# Patient Record
Sex: Male | Born: 1945 | Race: Black or African American | Hispanic: No | Marital: Married | Smoking: Never smoker
Health system: Southern US, Community
[De-identification: ages and names within clinical notes are randomized; demographics above are authoritative.]

## PROBLEM LIST (undated history)

## (undated) DIAGNOSIS — I10 Essential (primary) hypertension: Secondary | ICD-10-CM

## (undated) DIAGNOSIS — J309 Allergic rhinitis, unspecified: Secondary | ICD-10-CM

## (undated) HISTORY — DX: Allergic rhinitis, unspecified: J30.9

## (undated) HISTORY — DX: Essential (primary) hypertension: I10

---

## 2010-11-08 ENCOUNTER — Emergency Department (HOSPITAL_COMMUNITY)
Admission: EM | Admit: 2010-11-08 | Discharge: 2010-11-09 | Disposition: A | Discharge status: Home / Self Care | Payer: Self-pay | Attending: Emergency Medicine | Admitting: Emergency Medicine

## 2010-11-08 DIAGNOSIS — Z0000 Encounter for general adult medical examination without abnormal findings: Secondary | ICD-10-CM | POA: Insufficient documentation

## 2012-11-05 ENCOUNTER — Emergency Department (HOSPITAL_COMMUNITY)
Admission: EM | Admit: 2012-11-05 | Discharge: 2012-11-05 | Disposition: A | Discharge status: Home / Self Care | Payer: Medicare HMO | Attending: Emergency Medicine | Admitting: Emergency Medicine

## 2012-11-05 ENCOUNTER — Encounter (HOSPITAL_COMMUNITY): Payer: Self-pay | Admitting: Emergency Medicine

## 2012-11-05 DIAGNOSIS — M25569 Pain in unspecified knee: Secondary | ICD-10-CM | POA: Insufficient documentation

## 2012-11-05 DIAGNOSIS — M79609 Pain in unspecified limb: Secondary | ICD-10-CM | POA: Insufficient documentation

## 2012-11-05 MED ORDER — ENOXAPARIN SODIUM 80 MG/0.8ML ~~LOC~~ SOLN
1.0000 mg/kg | Freq: Once | SUBCUTANEOUS | Status: AC
  Administered 2012-11-05: 75 mg via SUBCUTANEOUS
  Filled 2012-11-05: qty 0.8

## 2012-11-05 MED ORDER — HYDROCODONE-ACETAMINOPHEN 5-325 MG PO TABS
1.0000 | ORAL_TABLET | Freq: Once | ORAL | Status: AC
  Administered 2012-11-05: 1 via ORAL
  Filled 2012-11-05: qty 1

## 2012-11-05 NOTE — ED Provider Notes (Signed)
Medical screening examination/treatment/procedure(s) were performed by non-physician practitioner and as supervising physician I was immediately available for consultation/collaboration.  Kamdyn Colborn, MD 11/05/12 0623 

## 2012-11-05 NOTE — ED Notes (Signed)
Pt c/o pain to L popliteal area onset @ 1600 today, denies injury, pt states he had been traveling by bus for @ 5 hours prior to feeling pain.

## 2012-11-05 NOTE — ED Provider Notes (Signed)
History     CSN: [id number]  Arrival date & time 11/05/12  0003   First MD Initiated Contact with Patient 11/05/12 0158      Chief Complaint  Patient presents with  . Leg Pain   HPI  History provided by the patient and family. Patient is a 67 year old male with no significant PMH who presents with complaints of acute onset left leg and knee pains. Patient was traveling by bus home to [place] earlier today. He had a 5 hour bus trip and around 4 PM developed sudden sharp pain to the popliteal area of his left knee. Pain was persistent and worse with any movements and ambulation. He also reports some earlier episode of swelling down the medial aspect of the lower leg. This has since resolved some. He did not use any treatment for his symptoms. He denies any old or previous injuries to this knee. There was no trauma or fall to his knee. No other aggravating or alleviating factors. No associated chest pain, shortness of breath hemoptysis or heart palpitations. No other associated symptoms.    History reviewed. No pertinent past medical history.  History reviewed. No pertinent past surgical history.  No family history on file.  History  Substance Use Topics  . Smoking status: Never Smoker   . Smokeless tobacco: Not on file  . Alcohol Use: No      Review of Systems  Respiratory: Negative for shortness of breath.   Cardiovascular: Negative for chest pain and palpitations.  All other systems reviewed and are negative.    Allergies  Review of patient's allergies indicates no known allergies.  Home Medications  No current outpatient prescriptions on file.  BP 149/101  Pulse 68  Temp(Src) 98.4 F (36.9 C) (Oral)  Resp 18  Ht 5\' 7"  (1.702 m)  Wt 170 lb (77.111 kg)  BMI 26.62 kg/m2  SpO2 98%  Physical Exam  Nursing note and vitals reviewed. Constitutional: He is oriented to person, place, and time. He appears well-developed and well-nourished. No distress.  HENT:   Head: Normocephalic.  Cardiovascular: Normal rate and regular rhythm.   No murmur heard. Pulmonary/Chest: Effort normal and breath sounds normal. No respiratory distress. He has no wheezes. He has no rales.  Abdominal: Soft. There is no tenderness.  Musculoskeletal: Normal range of motion. He exhibits tenderness. He exhibits no edema.  There is tenderness in the left popliteal area without any mass appreciable swelling. Tendon attachments of the hamstrings palpable without tenderness.  Neurological: He is alert and oriented to person, place, and time.  Skin: Skin is warm. No rash noted. No erythema.  Psychiatric: He has a normal mood and affect. His behavior is normal.    ED Course  Procedures      1. Lower leg pain, left       MDM  2:30AM patient seen and evaluated. Patient well-appearing in no acute distress.   Patient scheduled for vascular Doppler of left lower leg. Dose of Lovenox given prophylaxis against possible DVT. Clinical exam is low suspicion for DVT at this time.     Angus Seller, PA-C 11/05/12 [phone]

## 2013-02-21 ENCOUNTER — Telehealth: Payer: Self-pay | Admitting: Family Medicine

## 2013-02-21 NOTE — Telephone Encounter (Signed)
PT states that Dr. Clent Ridges requested that he come in on Monday. This patient has never seen Dr. Clent Ridges before. Please assist.

## 2013-02-22 NOTE — Telephone Encounter (Signed)
Scheduled

## 2013-02-22 NOTE — Telephone Encounter (Signed)
He is the husband of one of my patients and I agreed to see him. Please set him up for next week

## 2013-02-27 ENCOUNTER — Ambulatory Visit (INDEPENDENT_AMBULATORY_CARE_PROVIDER_SITE_OTHER): Payer: Medicare Other | Admitting: Family Medicine

## 2013-02-27 ENCOUNTER — Encounter: Payer: Self-pay | Admitting: Family Medicine

## 2013-02-27 VITALS — BP 158/96 | HR 87 | Temp 98.5°F | Ht 66.75 in | Wt 168.0 lb

## 2013-02-27 DIAGNOSIS — I10 Essential (primary) hypertension: Secondary | ICD-10-CM

## 2013-02-27 DIAGNOSIS — N138 Other obstructive and reflux uropathy: Secondary | ICD-10-CM

## 2013-02-27 DIAGNOSIS — N139 Obstructive and reflux uropathy, unspecified: Secondary | ICD-10-CM

## 2013-02-27 DIAGNOSIS — N401 Enlarged prostate with lower urinary tract symptoms: Secondary | ICD-10-CM

## 2013-02-27 LAB — CBC WITH DIFFERENTIAL/PLATELET
Basophils Absolute: 0 10*3/uL (ref 0.0–0.1)
Eosinophils Absolute: 0.1 10*3/uL (ref 0.0–0.7)
HCT: 42.3 % (ref 39.0–52.0)
Hemoglobin: 14 g/dL (ref 13.0–17.0)
Lymphs Abs: 2 10*3/uL (ref 0.7–4.0)
MCHC: 33.2 g/dL (ref 30.0–36.0)
MCV: 94.8 fl (ref 78.0–100.0)
Monocytes Relative: 7.5 % (ref 3.0–12.0)
Neutro Abs: 1.7 10*3/uL (ref 1.4–7.7)
RDW: 13.6 % (ref 11.5–14.6)

## 2013-02-27 LAB — HEPATIC FUNCTION PANEL
AST: 21 U/L (ref 0–37)
Alkaline Phosphatase: 97 U/L (ref 39–117)
Bilirubin, Direct: 0.1 mg/dL (ref 0.0–0.3)
Total Bilirubin: 0.8 mg/dL (ref 0.3–1.2)

## 2013-02-27 LAB — LIPID PANEL
Total CHOL/HDL Ratio: 6
VLDL: 21.4 mg/dL (ref 0.0–40.0)

## 2013-02-27 LAB — BASIC METABOLIC PANEL
Chloride: 105 mEq/L (ref 96–112)
Potassium: 4.7 mEq/L (ref 3.5–5.1)

## 2013-02-27 LAB — PSA: PSA: 0.78 ng/mL (ref 0.10–4.00)

## 2013-02-27 NOTE — Progress Notes (Signed)
  Subjective:    Patient ID: Darrell Baldwin, male    DOB: [birth year], 67 y.o.   MRN: [id number]  HPI 67 yr old male to establish with [place]. I have seen his wife as a patient for years now. He feels well and has no concerns. The last time he saw a doctor in the office was about 5 years ago. He was told years ago that he had high BP and the doctor at that time wanted to treat it, but Darrell Baldwin was very resistant to taking any medications. He does check his BP monthly at the pharmacy, and he usually gets readings of 140s over 90s. He exercises daily by doing push ups and sit ups and he walks. He did martial arts for years when he was younger and he tries to keep himself in shape. 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 He has a strong family hx of HTN, high cholesterol, and diabetes.    Review of Systems  Constitutional: Negative.   Respiratory: Negative.   Cardiovascular: Negative.   Endocrine: Negative.   Neurological: Negative.        Objective:   Physical Exam  Constitutional: He is oriented to person, place, and time. He appears well-developed and well-nourished. No distress.  Neck: No thyromegaly present.  Cardiovascular: Normal rate, regular rhythm, normal heart sounds and intact distal pulses.   Pulmonary/Chest: Effort normal and breath sounds normal.  Musculoskeletal: He exhibits no edema.  Lymphadenopathy:    He has no cervical adenopathy.  Neurological: He is alert and oriented to person, place, and time.          Assessment & Plan:  Since he is fasting we will get labs today. He definitely has HTN and I advised him to start on medications for this, but he is very resistant to this. I asked him to watch the sodium intake in his diet and we will recheck this in one month. We will plan on doing a cpx at that time, and we will probably start a BP med then also.

## 2013-03-01 NOTE — Progress Notes (Signed)
Quick Note:  I spoke with pt and put a copy of results in mail, per pt request. ______

## 2013-04-02 ENCOUNTER — Encounter: Payer: Self-pay | Admitting: Family Medicine

## 2013-04-02 ENCOUNTER — Ambulatory Visit (INDEPENDENT_AMBULATORY_CARE_PROVIDER_SITE_OTHER): Payer: Medicare HMO | Admitting: Family Medicine

## 2013-04-02 VITALS — BP 150/100 | HR 105 | Temp 97.8°F | Wt 170.0 lb

## 2013-04-02 DIAGNOSIS — Z23 Encounter for immunization: Secondary | ICD-10-CM

## 2013-04-02 DIAGNOSIS — I10 Essential (primary) hypertension: Secondary | ICD-10-CM

## 2013-04-02 MED ORDER — AMLODIPINE BESYLATE 5 MG PO TABS: 5.0000 mg | ORAL_TABLET | Freq: Every day | ORAL | Status: DC

## 2013-04-02 NOTE — Progress Notes (Signed)
  Subjective:    Patient ID: Darrell Baldwin, male    DOB: [birth year], 67 y.o.   MRN: [id number]  HPI Here to follow up on newly diagnosed HTN. We saw him one month ago and we wanted to start him on meds for this at that time. However he was very resistant to this idea andhe wanted to try diet and exercise first. Now he has done so, but the BP has not improved. He still feels well.    Review of Systems  Constitutional: Negative.   Respiratory: Negative.   Cardiovascular: Negative.   Neurological: Negative.        Objective:   Physical Exam  Constitutional: He appears well-developed and well-nourished.  Cardiovascular: Normal rate, regular rhythm, normal heart sounds and intact distal pulses.   Pulmonary/Chest: Effort normal and breath sounds normal.          Assessment & Plan:  Start on Amlodipine and recheck in one month.

## 2013-04-25 ENCOUNTER — Ambulatory Visit (INDEPENDENT_AMBULATORY_CARE_PROVIDER_SITE_OTHER): Payer: Medicare HMO | Admitting: Family Medicine

## 2013-04-25 ENCOUNTER — Encounter: Payer: Self-pay | Admitting: Family Medicine

## 2013-04-25 VITALS — BP 146/98 | HR 77 | Temp 97.6°F | Wt 167.0 lb

## 2013-04-25 DIAGNOSIS — I10 Essential (primary) hypertension: Secondary | ICD-10-CM

## 2013-04-25 DIAGNOSIS — Z9109 Other allergy status, other than to drugs and biological substances: Secondary | ICD-10-CM

## 2013-04-25 MED ORDER — AMLODIPINE BESYLATE 10 MG PO TABS: 10.0000 mg | ORAL_TABLET | Freq: Every day | ORAL | Status: DC

## 2013-04-25 NOTE — Progress Notes (Signed)
  Subjective:    Patient ID: Darrell Baldwin, male    DOB: 12-11-1945, 67 y.o.   MRN: [id number]  HPI Here to follow up HTN and his allergies. This past month he has had a lot of PND, hoarseness, and a dry cough. This happens every fall, and usually Claritin D is the only thing that helps. Plain Claritin does not help.    Review of Systems  Constitutional: Negative.   HENT: Positive for postnasal drip.   Eyes: Negative.   Respiratory: Positive for cough. Negative for shortness of breath and wheezing.   Cardiovascular: Negative.        Objective:   Physical Exam  Constitutional: He appears well-developed and well-nourished.  HENT:  Right Ear: External ear normal.  Left Ear: External ear normal.  Nose: Nose normal.  Mouth/Throat: Oropharynx is clear and moist.  Eyes: Conjunctivae are normal.  Cardiovascular: Normal rate, regular rhythm, normal heart sounds and intact distal pulses.   Pulmonary/Chest: Effort normal and breath sounds normal.  Lymphadenopathy:    He has no cervical adenopathy.          Assessment & Plan:  We will increase Amlodipine to 10 mg daily. He can use Claritin D for a few weeks, at least until the ragweed season is over.

## 2013-08-14 ENCOUNTER — Encounter: Payer: Self-pay | Admitting: Family Medicine

## 2013-08-14 ENCOUNTER — Ambulatory Visit (INDEPENDENT_AMBULATORY_CARE_PROVIDER_SITE_OTHER): Payer: Medicare HMO | Admitting: Family Medicine

## 2013-08-14 VITALS — BP 140/90 | HR 90 | Temp 98.4°F | Ht 66.75 in | Wt 169.0 lb

## 2013-08-14 DIAGNOSIS — I10 Essential (primary) hypertension: Secondary | ICD-10-CM

## 2013-08-14 DIAGNOSIS — N529 Male erectile dysfunction, unspecified: Secondary | ICD-10-CM

## 2013-08-14 MED ORDER — SILDENAFIL CITRATE 100 MG PO TABS: 50.0000 mg | ORAL_TABLET | ORAL | Status: DC | PRN

## 2013-08-14 NOTE — Progress Notes (Signed)
Pre visit review using our clinic review tool, if applicable. No additional management support is needed unless otherwise documented below in the visit note. 

## 2013-08-15 ENCOUNTER — Encounter: Payer: Self-pay | Admitting: Family Medicine

## 2013-08-15 DIAGNOSIS — N529 Male erectile dysfunction, unspecified: Secondary | ICD-10-CM | POA: Insufficient documentation

## 2013-08-15 NOTE — Progress Notes (Signed)
   Subjective:    Patient ID: Darrell Baldwin, male    DOB: 12/30/1945, 68 y.o.   MRN: [id number]  HPI Here to follow up on BP. This is stable at home with readings in the 130s over 80s. He feels fine but does ask about trouble getting and maintaining erections.    Review of Systems  Constitutional: Negative.   Respiratory: Negative.   Cardiovascular: Negative.        Objective:   Physical Exam  Constitutional: He appears well-developed and well-nourished.  Cardiovascular: Normal rate, regular rhythm, normal heart sounds and intact distal pulses.   Pulmonary/Chest: Effort normal and breath sounds normal.  Lymphadenopathy:    He has no cervical adenopathy.          Assessment & Plan:  HTN is stable. Try Viagra

## 2013-12-11 ENCOUNTER — Ambulatory Visit (INDEPENDENT_AMBULATORY_CARE_PROVIDER_SITE_OTHER): Payer: Medicare HMO | Admitting: Family Medicine

## 2013-12-11 ENCOUNTER — Encounter: Payer: Self-pay | Admitting: Family Medicine

## 2013-12-11 VITALS — BP 153/94 | HR 79 | Temp 98.2°F | Ht 66.75 in | Wt 172.0 lb

## 2013-12-11 DIAGNOSIS — Z9109 Other allergy status, other than to drugs and biological substances: Secondary | ICD-10-CM

## 2013-12-11 MED ORDER — FLUTICASONE PROPIONATE 50 MCG/ACT NA SUSP: 2.0000 | Freq: Every day | NASAL | Status: DC

## 2013-12-11 NOTE — Progress Notes (Signed)
Pre visit review using our clinic review tool, if applicable. No additional management support is needed unless otherwise documented below in the visit note. 

## 2013-12-11 NOTE — Progress Notes (Signed)
   Subjective:    Patient ID: Darrell Baldwin, male    DOB: 01/18/1946, 68 y.o.   MRN: [id number]  HPI Here for allergy problems. He gets head and nose congestion to the point he cannot breathe through the nose. Some sneezing but no coughing. Uses Claritin D sporadically.    Review of Systems  Constitutional: Negative.   HENT: Positive for congestion, postnasal drip, rhinorrhea, sinus pressure and sneezing. Negative for sore throat.   Eyes: Positive for itching.  Respiratory: Negative.        Objective:   Physical Exam  Constitutional: He appears well-developed and well-nourished.  HENT:  Right Ear: External ear normal.  Left Ear: External ear normal.  Nose: Nose normal.  Mouth/Throat: Oropharynx is clear and moist.  Eyes: Conjunctivae are normal.  Pulmonary/Chest: Effort normal and breath sounds normal.  Lymphadenopathy:    He has no cervical adenopathy.          Assessment & Plan:  He needs to use Claritin D consistently on a daily basis. Add Flonase daily

## 2014-03-03 ENCOUNTER — Encounter: Payer: Self-pay | Admitting: Family Medicine

## 2014-03-03 ENCOUNTER — Ambulatory Visit (INDEPENDENT_AMBULATORY_CARE_PROVIDER_SITE_OTHER): Payer: Medicare HMO | Admitting: Family Medicine

## 2014-03-03 VITALS — BP 168/101 | HR 66 | Temp 98.1°F | Ht 66.75 in | Wt 169.0 lb

## 2014-03-03 DIAGNOSIS — Z0000 Encounter for general adult medical examination without abnormal findings: Secondary | ICD-10-CM

## 2014-03-03 DIAGNOSIS — I10 Essential (primary) hypertension: Secondary | ICD-10-CM

## 2014-03-03 LAB — CBC WITH DIFFERENTIAL/PLATELET
BASOS PCT: 0.5 % (ref 0.0–3.0)
Basophils Absolute: 0 10*3/uL (ref 0.0–0.1)
Eosinophils Absolute: 0.1 10*3/uL (ref 0.0–0.7)
Eosinophils Relative: 2.5 % (ref 0.0–5.0)
HEMATOCRIT: 41 % (ref 39.0–52.0)
Hemoglobin: 13.8 g/dL (ref 13.0–17.0)
Lymphocytes Relative: 45.8 % (ref 12.0–46.0)
Lymphs Abs: 1.8 10*3/uL (ref 0.7–4.0)
MCHC: 33.7 g/dL (ref 30.0–36.0)
MCV: 94.2 fl (ref 78.0–100.0)
Monocytes Absolute: 0.3 10*3/uL (ref 0.1–1.0)
Monocytes Relative: 7.4 % (ref 3.0–12.0)
NEUTROS ABS: 1.8 10*3/uL (ref 1.4–7.7)
NEUTROS PCT: 43.8 % (ref 43.0–77.0)
PLATELETS: 245 10*3/uL (ref 150.0–400.0)
RBC: 4.35 Mil/uL (ref 4.22–5.81)
RDW: 14.1 % (ref 11.5–15.5)
WBC: 4 10*3/uL (ref 4.0–10.5)

## 2014-03-03 LAB — TSH: TSH: 0.52 u[IU]/mL (ref 0.35–4.50)

## 2014-03-03 LAB — LIPID PANEL
CHOLESTEROL: 243 mg/dL — AB (ref 0–200)
HDL: 49 mg/dL (ref >39.00)
LDL CALC: 173 mg/dL — AB (ref 0–99)
NonHDL: 194
TRIGLYCERIDES: 103 mg/dL (ref 0.0–149.0)
Total CHOL/HDL Ratio: 5
VLDL: 20.6 mg/dL (ref 0.0–40.0)

## 2014-03-03 LAB — BASIC METABOLIC PANEL
BUN: 16 mg/dL (ref 6–23)
CALCIUM: 9.5 mg/dL (ref 8.4–10.5)
CO2: 29 meq/L (ref 19–32)
Chloride: 106 mEq/L (ref 96–112)
Creatinine, Ser: 1.5 mg/dL (ref 0.4–1.5)
GFR: 61.21 mL/min (ref >60.00)
GLUCOSE: 101 mg/dL — AB (ref 70–99)
POTASSIUM: 5.1 meq/L (ref 3.5–5.1)
SODIUM: 140 meq/L (ref 135–145)

## 2014-03-03 LAB — PSA: PSA: 1.24 ng/mL (ref 0.10–4.00)

## 2014-03-03 LAB — POCT URINALYSIS DIPSTICK
Bilirubin, UA: NEGATIVE
Glucose, UA: NEGATIVE
Ketones, UA: NEGATIVE
Leukocytes, UA: NEGATIVE
NITRITE UA: NEGATIVE
PH UA: 5
RBC UA: NEGATIVE
SPEC GRAV UA: 1.025
UROBILINOGEN UA: 0.2

## 2014-03-03 LAB — HEPATIC FUNCTION PANEL
ALBUMIN: 4 g/dL (ref 3.5–5.2)
ALK PHOS: 103 U/L (ref 39–117)
ALT: 13 U/L (ref 0–53)
AST: 17 U/L (ref 0–37)
BILIRUBIN DIRECT: 0.1 mg/dL (ref 0.0–0.3)
TOTAL PROTEIN: 7.3 g/dL (ref 6.0–8.3)
Total Bilirubin: 0.7 mg/dL (ref 0.2–1.2)

## 2014-03-03 MED ORDER — AMLODIPINE BESYLATE 10 MG PO TABS: 10.0000 mg | ORAL_TABLET | Freq: Every day | ORAL | Status: DC

## 2014-03-03 MED ORDER — HYDROCHLOROTHIAZIDE 25 MG PO TABS: 25.0000 mg | ORAL_TABLET | Freq: Every day | ORAL | Status: DC

## 2014-03-03 NOTE — Progress Notes (Signed)
   Subjective:    Patient ID: Darrell Baldwin, male    DOB: 01/23/1946, 68 y.o.   MRN: [id number]  HPI 68 yr old male for a cpx. He feels well in general. He has not had an eye exam in over 5 years. He has never had a colonoscopy. He asks me to check a lump that came up on his finger about one month ago. This does not bother him at all.    Review of Systems  Constitutional: Negative.   HENT: Negative.   Eyes: Negative.   Respiratory: Negative.   Cardiovascular: Negative.   Gastrointestinal: Negative.   Genitourinary: Negative.   Musculoskeletal: Negative.   Skin: Negative.   Neurological: Negative.   Psychiatric/Behavioral: Negative.        Objective:   Physical Exam  Constitutional: He is oriented to person, place, and time. He appears well-developed and well-nourished. No distress.  HENT:  Head: Normocephalic and atraumatic.  Right Ear: External ear normal.  Left Ear: External ear normal.  Nose: Nose normal.  Mouth/Throat: Oropharynx is clear and moist. No oropharyngeal exudate.  Eyes: Conjunctivae and EOM are normal. Pupils are equal, round, and reactive to light. Right eye exhibits no discharge. Left eye exhibits no discharge. No scleral icterus.  Neck: Neck supple. No JVD present. No tracheal deviation present. No thyromegaly present.  Cardiovascular: Normal rate, regular rhythm, normal heart sounds and intact distal pulses.  Exam reveals no gallop and no friction rub.   No murmur heard. EKG normal   Pulmonary/Chest: Effort normal and breath sounds normal. No respiratory distress. He has no wheezes. He has no rales. He exhibits no tenderness.  Abdominal: Soft. Bowel sounds are normal. He exhibits no distension and no mass. There is no tenderness. There is no rebound and no guarding.  Genitourinary: Rectum normal, prostate normal and penis normal. Guaiac negative stool. No penile tenderness.  Musculoskeletal: Normal range of motion. He exhibits no edema and no tenderness.   Small firm non-tender mobile lump over the dorsal PIP of the right index finger, full ROM  Lymphadenopathy:    He has no cervical adenopathy.  Neurological: He is alert and oriented to person, place, and time. He has normal reflexes. No cranial nerve deficit. He exhibits normal muscle tone. Coordination normal.  Skin: Skin is warm and dry. No rash noted. He is not diaphoretic. No erythema. No pallor.  Psychiatric: He has a normal mood and affect. His behavior is normal. Judgment and thought content normal.          Assessment & Plan:  Well exam. Get fasting labs. Set up a colonoscopy. I advised him to get a full eye exam. He has a ganglion cyst on the finger. He will monitor this for the time being.

## 2014-03-03 NOTE — Progress Notes (Signed)
Pre visit review using our clinic review tool, if applicable. No additional management support is needed unless otherwise documented below in the visit note. 

## 2014-03-04 ENCOUNTER — Telehealth: Payer: Self-pay | Admitting: Family Medicine

## 2014-03-04 NOTE — Telephone Encounter (Signed)
Relevant patient education mailed to patient.  

## 2014-03-05 ENCOUNTER — Encounter: Payer: Self-pay | Admitting: Internal Medicine

## 2014-03-26 ENCOUNTER — Ambulatory Visit (AMBULATORY_SURGERY_CENTER): Payer: Commercial Managed Care - HMO | Admitting: *Deleted

## 2014-03-26 VITALS — Ht 66.75 in | Wt 171.0 lb

## 2014-03-26 DIAGNOSIS — Z1211 Encounter for screening for malignant neoplasm of colon: Secondary | ICD-10-CM

## 2014-03-26 MED ORDER — MOVIPREP 100 G PO SOLR
ORAL | Status: DC
Start: 2014-03-26 — End: 2014-04-11

## 2014-03-26 NOTE — Progress Notes (Signed)
Patient denies any allergies to eggs or soy. Patient denies surgery in the past. Patient denies any oxygen use at home and does not take any diet/weight loss medications. EMMI education assisgned to patient on colonoscopy, this was explained and instructions given to patient.

## 2014-04-11 ENCOUNTER — Encounter: Payer: Self-pay | Admitting: Internal Medicine

## 2014-04-11 ENCOUNTER — Ambulatory Visit (AMBULATORY_SURGERY_CENTER): Payer: Commercial Managed Care - HMO | Admitting: Internal Medicine

## 2014-04-11 VITALS — BP 135/78 | HR 57 | Temp 97.4°F | Resp 12 | Ht 66.75 in | Wt 171.0 lb

## 2014-04-11 DIAGNOSIS — Z1211 Encounter for screening for malignant neoplasm of colon: Secondary | ICD-10-CM

## 2014-04-11 HISTORY — PX: COLONOSCOPY: SHX174

## 2014-04-11 MED ORDER — SODIUM CHLORIDE 0.9 % IV SOLN: 500.0000 mL | INTRAVENOUS | Status: DC

## 2014-04-11 NOTE — Patient Instructions (Signed)
YOU HAD AN ENDOSCOPIC PROCEDURE TODAY AT THE [facility]: Refer to the procedure report that was given to you for any specific questions about what was found during the examination.  If the procedure report does not answer your questions, please call your gastroenterologist to clarify.  If you requested that your care partner not be given the details of your procedure findings, then the procedure report has been included in a sealed envelope for you to review at your convenience later.  YOU SHOULD EXPECT: Some feelings of bloating in the abdomen. Passage of more gas than usual.  Walking can help get rid of the air that was put into your GI tract during the procedure and reduce the bloating. If you had a lower endoscopy (such as a colonoscopy or flexible sigmoidoscopy) you may notice spotting of blood in your stool or on the toilet paper. If you underwent a bowel prep for your procedure, then you may not have a normal bowel movement for a few days.  DIET: Your first meal following the procedure should be a light meal and then it is ok to progress to your normal diet.  A half-sandwich or bowl of soup is an example of a good first meal.  Heavy or fried foods are harder to digest and may make you feel nauseous or bloated.  Likewise meals heavy in dairy and vegetables can cause extra gas to form and this can also increase the bloating.  Drink plenty of fluids but you should avoid alcoholic beverages for 24 hours.  ACTIVITY: Your care partner should take you home directly after the procedure.  You should plan to take it easy, moving slowly for the rest of the day.  You can resume normal activity the day after the procedure however you should NOT DRIVE or use heavy machinery for 24 hours (because of the sedation medicines used during the test).    SYMPTOMS TO REPORT IMMEDIATELY: A gastroenterologist can be reached at any hour.  During normal business hours, 8:30 AM to 5:00 PM Monday through Friday,  call [phone].  After hours and on weekends, please call the GI answering service at [phone] who will take a message and have the physician on call contact you.   Following lower endoscopy (colonoscopy or flexible sigmoidoscopy):  Excessive amounts of blood in the stool  Significant tenderness or worsening of abdominal pains  Swelling of the abdomen that is new, acute  Fever of 100F or higher    FOLLOW UP: If any biopsies were taken you will be contacted by phone or by letter within the next 1-3 weeks.  Call your gastroenterologist if you have not heard about the biopsies in 3 weeks.  Our staff will call the home number listed on your records the next business day following your procedure to check on you and address any questions or concerns that you may have at that time regarding the information given to you following your procedure. This is a courtesy call and so if there is no answer at the home number and we have not heard from you through the emergency physician on call, we will assume that you have returned to your regular daily activities without incident.  SIGNATURES/CONFIDENTIALITY: You and/or your care partner have signed paperwork which will be entered into your electronic medical record.  These signatures attest to the fact that that the information above on your After Visit Summary has been reviewed and is understood.  Full responsibility of the confidentiality   of this discharge information lies with you and/or your care-partner.   Diverticulosis, high fiber diet, hemorrhoids.  Follow up 10 years-2025

## 2014-04-11 NOTE — Op Note (Signed)
[facility] [address] [place], [address]   COLONOSCOPY PROCEDURE REPORT  PATIENT: Darrell Baldwin, Darrell Baldwin  MR#: [id number] BIRTHDATE: 11/15/1945 , 68  yrs. old GENDER: Male ENDOSCOPIST: Hart Carwin, MD REFERRED [id number] Marguerita Beards, M.D. PROCEDURE DATE:  04/11/2014 PROCEDURE:   Colonoscopy, screening First Screening Colonoscopy - Avg.  risk and is 50 yrs.  old or older Yes.  Prior Negative Screening - Now for repeat screening. N/A  History of Adenoma - Now for follow-up colonoscopy & has been > or = to 3 yrs.  N/A  Polyps Removed Today? No.  Recommend repeat exam, <10 yrs? No. ASA CLASS:   Class II INDICATIONS:Average risk patient for colon cancer. MEDICATIONS: MAC sedation, administered by CRNA and propofol (Diprivan)  IV  DESCRIPTION OF PROCEDURE:   After the risks benefits and alternatives of the procedure were thoroughly explained, informed consent was obtained.  A digital rectal exam revealed no abnormalities of the rectum.   The LB [id number]  endoscope was introduced through the anus and advanced to the cecum, which was identified by both the appendix and ileocecal valve. No adverse events experienced.   The quality of the prep was excellent, using MoviPrep  The instrument was then slowly withdrawn as the colon was fully examined.      COLON FINDINGS: There was mild diverticulosis noted in the ascending colon and sigmoid colon with associated angulation and muscular hypertrophy.   Small internal hemorrhoids were found.  Retroflexed views revealed no abnormalities. The time to cecum=3 minutes 58 seconds.  Withdrawal time=6 minutes 12 seconds.  The scope was withdrawn and the procedure completed. COMPLICATIONS: There were no complications.  ENDOSCOPIC IMPRESSION: 1.   There was mild diverticulosis noted in the ascending colon and sigmoid colon 2.   Small internal hemorrhoids  RECOMMENDATIONS: high fiber diet Recall colonoscopy in 10  years   eSigned:  Hart Carwin, MD 04/11/2014 10:32 AM   cc:   PATIENT NAME:  [name], [name] MR#: [id number]

## 2014-04-11 NOTE — Progress Notes (Signed)
Report to PACU, RN, vss, BBS= Clear.  

## 2014-04-14 ENCOUNTER — Telehealth: Payer: Self-pay | Admitting: *Deleted

## 2014-04-14 NOTE — Telephone Encounter (Signed)
  Follow up Call-  Call back number 04/11/2014  Post procedure Call Back phone  # [phone]  Permission to leave phone message Yes     Patient questions:  Do you have a fever, pain , or abdominal swelling? No. Pain Score  0 *  Have you tolerated food without any problems? Yes.    Have you been able to return to your normal activities? Yes.    Do you have any questions about your discharge instructions: Diet   No. Medications  No. Follow up visit  No.  Do you have questions or concerns about your Care? No.  Actions: * If pain score is 4 or above: No action needed, pain <4.

## 2014-08-04 ENCOUNTER — Ambulatory Visit (INDEPENDENT_AMBULATORY_CARE_PROVIDER_SITE_OTHER): Payer: Commercial Managed Care - HMO | Admitting: Family Medicine

## 2014-08-04 ENCOUNTER — Encounter: Payer: Self-pay | Admitting: Family Medicine

## 2014-08-04 VITALS — BP 145/93 | HR 75 | Temp 98.1°F | Ht 66.75 in | Wt 168.0 lb

## 2014-08-04 DIAGNOSIS — R21 Rash and other nonspecific skin eruption: Secondary | ICD-10-CM | POA: Diagnosis not present

## 2014-08-04 MED ORDER — METHYLPREDNISOLONE ACETATE 80 MG/ML IJ SUSP
120.0000 mg | Freq: Once | INTRAMUSCULAR | Status: AC
  Administered 2014-08-04: 120 mg via INTRAMUSCULAR

## 2014-08-04 MED ORDER — SILDENAFIL CITRATE 100 MG PO TABS: 50.0000 mg | ORAL_TABLET | ORAL | Status: DC | PRN

## 2014-08-04 MED ORDER — PREDNISONE 10 MG PO TABS: ORAL_TABLET | ORAL | Status: DC

## 2014-08-04 NOTE — Progress Notes (Signed)
Pre visit review using our clinic review tool, if applicable. No additional management support is needed unless otherwise documented below in the visit note. 

## 2014-08-04 NOTE — Progress Notes (Signed)
   Subjective:    Patient ID: Darrell Baldwin, male    DOB: 1945-12-14, 69 y.o.   MRN: [id number]  HPI Here for 2 weeks of a cluster of itchy rad bumps on the right arm. There is no pain involved. He never had vesicles or blisters. No new soaps or lotions or detergents.    Review of Systems  Constitutional: Negative.   Skin: Positive for rash.       Objective:   Physical Exam  Constitutional: He appears well-developed and well-nourished.  Skin:  The right upper arm has a cluster of red nodules and papules, most of which are excoriated. No vesicles seen           Assessment & Plan:  The distribution of these bumps would suggest shingles but they are solid and not vesicular. This could be a hives like reaction. Given a steroid shot and a prednisone taper. Add benadryl prn

## 2014-09-26 ENCOUNTER — Ambulatory Visit: Payer: Commercial Managed Care - HMO | Admitting: Family Medicine

## 2014-10-14 ENCOUNTER — Encounter: Payer: Self-pay | Admitting: Family Medicine

## 2014-10-14 ENCOUNTER — Ambulatory Visit (INDEPENDENT_AMBULATORY_CARE_PROVIDER_SITE_OTHER): Payer: Commercial Managed Care - HMO | Admitting: Family Medicine

## 2014-10-14 VITALS — BP 148/89 | HR 81 | Temp 98.3°F | Ht 66.75 in | Wt 168.0 lb

## 2014-10-14 DIAGNOSIS — M5432 Sciatica, left side: Secondary | ICD-10-CM | POA: Diagnosis not present

## 2014-10-14 MED ORDER — PREDNISONE 10 MG PO TABS: ORAL_TABLET | ORAL | Status: DC

## 2014-10-14 MED ORDER — METHYLPREDNISOLONE ACETATE 80 MG/ML IJ SUSP
120.0000 mg | Freq: Once | INTRAMUSCULAR | Status: AC
  Administered 2014-10-14: 120 mg via INTRAMUSCULAR

## 2014-10-14 NOTE — Progress Notes (Signed)
   Subjective:    Patient ID: Darrell Baldwin, male    DOB: 03/23/1946, 69 y.o.   MRN: [id number]  HPI Here for 3 months of intermittent sharp pains in the left lower back that radiate into the left thigh and buttock. No numbness or weakness in the legs. Aleve does not help. He thinks this was triggered when he bought a new pair of high top athletic shoes that were not very supportive to his feet. He has tried OTC inserts in his shoes with no relief.   Review of Systems  Constitutional: Negative.   Musculoskeletal: Positive for back pain.       Objective:   Physical Exam  Constitutional: He appears well-developed and well-nourished.  Musculoskeletal:  Tender over the left sciatic notch but not over the spine. ROM is full, and SLR are negative          Assessment & Plan:  Rest, use heat. Given a steroid shot

## 2014-10-14 NOTE — Progress Notes (Signed)
Pre visit review using our clinic review tool, if applicable. No additional management support is needed unless otherwise documented below in the visit note. 

## 2014-10-14 NOTE — Addendum Note (Signed)
Addended by: Aniceto BossNIMMONS, SYLVIA A on: 10/14/2014 04:33 PM   Modules accepted: Orders

## 2014-10-15 ENCOUNTER — Telehealth: Payer: Self-pay | Admitting: Family Medicine

## 2014-10-15 MED ORDER — DICLOFENAC SODIUM 75 MG PO TBEC: 75.0000 mg | DELAYED_RELEASE_TABLET | Freq: Two times a day (BID) | ORAL | Status: DC

## 2014-10-15 NOTE — Telephone Encounter (Signed)
Pt was seen yesterday for sciatica and per wife injection did not work. Pt can barely stand. Please advise

## 2014-10-15 NOTE — Telephone Encounter (Signed)
Called and spoke with pt and pt is aware rx was sent to pharmacy.  

## 2014-10-15 NOTE — Telephone Encounter (Signed)
Noted. Call in Diclofenac 75 mg bid prn pain, #60 with 5 rf

## 2014-10-21 ENCOUNTER — Telehealth: Payer: Self-pay | Admitting: Family Medicine

## 2014-10-21 NOTE — Telephone Encounter (Signed)
I spoke with Darrell Baldwin and he said that he could wait until Dr. Clent RidgesFry gets back in office.

## 2014-10-21 NOTE — Telephone Encounter (Signed)
Wife called back and was upset she was not called. Would like to know if pt should see someone else for the referral.

## 2014-10-21 NOTE — Telephone Encounter (Addendum)
Wife called for pt to request MRI for pt. She states pt does not sleep, can hardly walk, is in a lot of pain.  The medicine dr fry gave is not working and the injection last week did not work. This issues has been going on >month. pls advise Pt aware dr fry out but would like to know if another MD would put a referral in for the mri

## 2014-10-22 NOTE — Telephone Encounter (Signed)
okay

## 2014-10-22 NOTE — Telephone Encounter (Signed)
FYI Wife called back today insistent on pt being seen this week.  Advised pt was asked about getting appt w/ another provider,  but she states that he doesn't want to wait. scheduled w/ dr Caryl Neverburchette in the am.

## 2014-10-23 ENCOUNTER — Ambulatory Visit (INDEPENDENT_AMBULATORY_CARE_PROVIDER_SITE_OTHER)
Admission: RE | Admit: 2014-10-23 | Discharge: 2014-10-23 | Disposition: A | Discharge status: Home / Self Care | Payer: Commercial Managed Care - HMO | Source: Ambulatory Visit | Attending: Family Medicine | Admitting: Family Medicine

## 2014-10-23 ENCOUNTER — Ambulatory Visit (INDEPENDENT_AMBULATORY_CARE_PROVIDER_SITE_OTHER): Payer: Commercial Managed Care - HMO | Admitting: Family Medicine

## 2014-10-23 VITALS — BP 128/70 | HR 64 | Temp 98.0°F | Wt 167.0 lb

## 2014-10-23 DIAGNOSIS — M5416 Radiculopathy, lumbar region: Secondary | ICD-10-CM

## 2014-10-23 DIAGNOSIS — M5136 Other intervertebral disc degeneration, lumbar region: Secondary | ICD-10-CM | POA: Diagnosis not present

## 2014-10-23 MED ORDER — HYDROCODONE-ACETAMINOPHEN 5-325 MG PO TABS: 1.0000 | ORAL_TABLET | Freq: Four times a day (QID) | ORAL | Status: DC | PRN

## 2014-10-23 NOTE — Progress Notes (Signed)
Pre visit review using our clinic review tool, if applicable. No additional management support is needed unless otherwise documented below in the visit note. 

## 2014-10-23 NOTE — Progress Notes (Signed)
   Subjective:    Patient ID: Darrell Baldwin, male    DOB: 01/28/1946, 69 y.o.   MRN: [id number]  HPI Patient seen with resistant left lumbar back pain radiating into left buttock and down the left lower extremity to about the knee. He's had about 3 months of total pain at this point. He was seen back week ago and received Depo-Medrol which did not help any. He's taken Voltaren without relief. He describes a sharp achy pain 8 out of 10 severity at its worst. Worse with lying and starting to interfere more with sleep. Denies any history of back injury. No associated numbness or weakness. No urine or stool incontinence.  He denies any appetite or weight changes. No fevers or chills. No dysuria. No prior history of back difficulties.  Past Medical History  Diagnosis Date  . Hypertension    No past surgical history on file.  reports that he has never smoked. He has never used smokeless tobacco. He reports that he does not drink alcohol or use illicit drugs. family history is negative for Colon cancer. No Known Allergies    Review of Systems  Constitutional: Negative for fever, chills, activity change, appetite change and unexpected weight change.  Respiratory: Negative for cough and shortness of breath.   Cardiovascular: Negative for chest pain and leg swelling.  Gastrointestinal: Negative for vomiting and abdominal pain.  Genitourinary: Negative for dysuria, hematuria and flank pain.  Musculoskeletal: Positive for back pain. Negative for joint swelling.  Skin: Negative for rash.  Neurological: Negative for weakness and numbness.       Objective:   Physical Exam  Constitutional: He appears well-developed and well-nourished. No distress.  Cardiovascular: Normal rate and regular rhythm.   Pulmonary/Chest: Effort normal and breath sounds normal. No respiratory distress. He has no wheezes. He has no rales.  Musculoskeletal: He exhibits no edema.  Straight leg raise is positive on the  left  Neurological:  Slightly diminished strength with left dorsiflexion compared to right.  diminished left ankle reflex compared to right otherwise nonfocal neurologic exam          Assessment & Plan:  Left lumbar radiculitis symptoms with decreased left ankle reflex and slight weakness and left dorsiflexion compared to right. No relief with steroid injection or Voltaren. Obtain LS-spine films. Limited Vicodin 325/5 mg 1-2 every 6 hours for severe pain. May need MRI to further assess.

## 2014-10-24 ENCOUNTER — Other Ambulatory Visit: Payer: Self-pay | Admitting: Family Medicine

## 2014-10-24 DIAGNOSIS — M5416 Radiculopathy, lumbar region: Secondary | ICD-10-CM

## 2014-11-04 ENCOUNTER — Ambulatory Visit
Admission: RE | Admit: 2014-11-04 | Discharge: 2014-11-04 | Disposition: A | Discharge status: Home / Self Care | Payer: Commercial Managed Care - HMO | Source: Ambulatory Visit | Attending: Family Medicine | Admitting: Family Medicine

## 2014-11-04 DIAGNOSIS — M47817 Spondylosis without myelopathy or radiculopathy, lumbosacral region: Secondary | ICD-10-CM | POA: Diagnosis not present

## 2014-11-04 DIAGNOSIS — M5416 Radiculopathy, lumbar region: Secondary | ICD-10-CM

## 2014-11-04 DIAGNOSIS — M5136 Other intervertebral disc degeneration, lumbar region: Secondary | ICD-10-CM | POA: Diagnosis not present

## 2014-11-04 DIAGNOSIS — M4316 Spondylolisthesis, lumbar region: Secondary | ICD-10-CM | POA: Diagnosis not present

## 2014-11-04 DIAGNOSIS — M4806 Spinal stenosis, lumbar region: Secondary | ICD-10-CM | POA: Diagnosis not present

## 2014-11-07 ENCOUNTER — Other Ambulatory Visit: Payer: Commercial Managed Care - HMO

## 2014-12-16 ENCOUNTER — Ambulatory Visit: Payer: Commercial Managed Care - HMO | Admitting: Family Medicine

## 2014-12-24 ENCOUNTER — Encounter: Payer: Self-pay | Admitting: Family Medicine

## 2014-12-24 ENCOUNTER — Ambulatory Visit (INDEPENDENT_AMBULATORY_CARE_PROVIDER_SITE_OTHER): Payer: Commercial Managed Care - HMO | Admitting: Family Medicine

## 2014-12-24 VITALS — BP 153/100 | HR 69 | Temp 98.1°F | Ht 66.75 in | Wt 169.0 lb

## 2014-12-24 DIAGNOSIS — M5442 Lumbago with sciatica, left side: Secondary | ICD-10-CM | POA: Insufficient documentation

## 2014-12-24 MED ORDER — OXYCODONE-ACETAMINOPHEN 10-325 MG PO TABS: 1.0000 | ORAL_TABLET | Freq: Four times a day (QID) | ORAL | Status: DC | PRN

## 2014-12-24 MED ORDER — FLUTICASONE PROPIONATE 50 MCG/ACT NA SUSP: 2.0000 | Freq: Every day | NASAL | Status: DC

## 2014-12-24 NOTE — Progress Notes (Signed)
Pre visit review using our clinic review tool, if applicable. No additional management support is needed unless otherwise documented below in the visit note. 

## 2014-12-24 NOTE — Progress Notes (Signed)
   Subjective:    Patient ID: Darrell Baldwin, male    DOB: 04/24/1946, 69 y.o.   MRN: [id number]  HPI Here for continuing pain in the lower back which radiates down the left leg. Vicodin did not help at all, in fact he gets better pain relief with [employer]. 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    Review of Systems  Constitutional: Negative.   Musculoskeletal: Positive for back pain.       Objective:   Physical Exam  Constitutional:  In pain   Musculoskeletal:  Tender in the lower back with reduced ROM          Assessment & Plan:  Lumbar disc disease. Try Percocet for pain. Refer to Neurosurgery.

## 2015-02-03 DIAGNOSIS — M4316 Spondylolisthesis, lumbar region: Secondary | ICD-10-CM | POA: Diagnosis not present

## 2015-02-03 DIAGNOSIS — G5602 Carpal tunnel syndrome, left upper limb: Secondary | ICD-10-CM | POA: Diagnosis not present

## 2015-02-03 DIAGNOSIS — M4807 Spinal stenosis, lumbosacral region: Secondary | ICD-10-CM | POA: Diagnosis not present

## 2015-02-03 DIAGNOSIS — G5622 Lesion of ulnar nerve, left upper limb: Secondary | ICD-10-CM | POA: Diagnosis not present

## 2015-02-10 DIAGNOSIS — M4807 Spinal stenosis, lumbosacral region: Secondary | ICD-10-CM | POA: Diagnosis not present

## 2015-02-23 ENCOUNTER — Other Ambulatory Visit: Payer: Self-pay | Admitting: Family Medicine

## 2015-03-04 DIAGNOSIS — Z6826 Body mass index (BMI) 26.0-26.9, adult: Secondary | ICD-10-CM | POA: Diagnosis not present

## 2015-03-04 DIAGNOSIS — M4807 Spinal stenosis, lumbosacral region: Secondary | ICD-10-CM | POA: Diagnosis not present

## 2015-03-10 ENCOUNTER — Other Ambulatory Visit: Payer: Self-pay | Admitting: Family Medicine

## 2015-07-08 DIAGNOSIS — I10 Essential (primary) hypertension: Secondary | ICD-10-CM | POA: Diagnosis not present

## 2015-07-08 DIAGNOSIS — Z6826 Body mass index (BMI) 26.0-26.9, adult: Secondary | ICD-10-CM | POA: Diagnosis not present

## 2015-07-08 DIAGNOSIS — M4807 Spinal stenosis, lumbosacral region: Secondary | ICD-10-CM | POA: Diagnosis not present

## 2015-07-22 ENCOUNTER — Other Ambulatory Visit: Payer: Self-pay | Admitting: Family Medicine

## 2015-07-22 NOTE — Telephone Encounter (Signed)
Pt needs new rx oxycodone °

## 2015-07-23 MED ORDER — OXYCODONE-ACETAMINOPHEN 10-325 MG PO TABS: 1.0000 | ORAL_TABLET | Freq: Four times a day (QID) | ORAL | Status: DC | PRN

## 2015-07-23 NOTE — Telephone Encounter (Signed)
Spoke with pt and pt is aware rx is ready for pick up at the office.

## 2015-07-23 NOTE — Telephone Encounter (Signed)
done

## 2015-09-15 ENCOUNTER — Other Ambulatory Visit: Payer: Self-pay | Admitting: Family Medicine

## 2015-09-15 ENCOUNTER — Ambulatory Visit: Payer: Self-pay | Admitting: Family Medicine

## 2015-09-28 ENCOUNTER — Ambulatory Visit (INDEPENDENT_AMBULATORY_CARE_PROVIDER_SITE_OTHER): Payer: Commercial Managed Care - HMO | Admitting: Family Medicine

## 2015-09-28 ENCOUNTER — Encounter: Payer: Self-pay | Admitting: Family Medicine

## 2015-09-28 VITALS — BP 140/94 | HR 67 | Temp 98.4°F | Ht 66.75 in | Wt 159.0 lb

## 2015-09-28 DIAGNOSIS — I10 Essential (primary) hypertension: Secondary | ICD-10-CM

## 2015-09-28 DIAGNOSIS — L989 Disorder of the skin and subcutaneous tissue, unspecified: Secondary | ICD-10-CM | POA: Diagnosis not present

## 2015-09-28 DIAGNOSIS — M5442 Lumbago with sciatica, left side: Secondary | ICD-10-CM | POA: Diagnosis not present

## 2015-09-28 MED ORDER — DICLOFENAC SODIUM 75 MG PO TBEC: 75.0000 mg | DELAYED_RELEASE_TABLET | Freq: Two times a day (BID) | ORAL | Status: DC

## 2015-09-28 MED ORDER — OXYCODONE-ACETAMINOPHEN 10-325 MG PO TABS: 1.0000 | ORAL_TABLET | Freq: Four times a day (QID) | ORAL | Status: DC | PRN

## 2015-09-28 MED ORDER — HYDROCHLOROTHIAZIDE 25 MG PO TABS: 25.0000 mg | ORAL_TABLET | Freq: Every day | ORAL | Status: DC

## 2015-09-28 MED ORDER — AMLODIPINE BESYLATE 10 MG PO TABS: 10.0000 mg | ORAL_TABLET | Freq: Every day | ORAL | Status: DC

## 2015-09-28 NOTE — Progress Notes (Signed)
Pre visit review using our clinic review tool, if applicable. No additional management support is needed unless otherwise documented below in the visit note. 

## 2015-09-28 NOTE — Progress Notes (Signed)
   Subjective:    Patient ID: Darrell Baldwin, male    DOB: Aug 28, 1945, 70 y.o.   MRN: [id number]  HPI Here to discuss his low back pain and for medication refills. His back pain is stable but it bothers him every night and disrupts his sleep. He is not currently taking an NSAID but he uses Oxycodone prn. His BP at home is stable, usually in the 120s or 130s over 70s or 80s. He thinks it is up today because he was up all last night with back pain. Also he asks me to check a lump on is scalp that appeared about 5 years ago. It is getting larger, it has changed colors, and it itches.    Review of Systems  Constitutional: Negative.   Respiratory: Negative.   Cardiovascular: Negative.   Musculoskeletal: Positive for back pain.  Neurological: Negative.        Objective:   Physical Exam  Constitutional: He is oriented to person, place, and time. He appears well-developed and well-nourished.  Neck: No thyromegaly present.  Cardiovascular: Normal rate, regular rhythm, normal heart sounds and intact distal pulses.   Pulmonary/Chest: Effort normal and breath sounds normal.  Musculoskeletal: Normal range of motion. He exhibits no edema or tenderness.  Lymphadenopathy:    He has no cervical adenopathy.  Neurological: He is alert and oriented to person, place, and time.  Skin:  The scalp vertex has a nodular 8mm lesion which has speckled colors of tan to brown to pink           Assessment & Plan:  His HTN is stable. His back pain is stable but we will start him back on Diclofenac to reduce inflammation. He can add oxycodone prn. The scalp lesion is suspicious for a possible melanoma so we will refer [facility] to evaluate

## 2015-10-09 ENCOUNTER — Ambulatory Visit (INDEPENDENT_AMBULATORY_CARE_PROVIDER_SITE_OTHER): Payer: Commercial Managed Care - HMO | Admitting: Family Medicine

## 2015-10-09 ENCOUNTER — Encounter: Payer: Self-pay | Admitting: Family Medicine

## 2015-10-09 VITALS — BP 129/84 | HR 83 | Temp 99.1°F | Ht 66.75 in | Wt 160.0 lb

## 2015-10-09 DIAGNOSIS — J069 Acute upper respiratory infection, unspecified: Secondary | ICD-10-CM | POA: Diagnosis not present

## 2015-10-09 NOTE — Progress Notes (Signed)
   Subjective:    Patient ID: Darrell Baldwin, male    DOB: 09-24-1945, 70 y.o.   MRN: [id number]  HPI Here for 4 days of PND, stuffy head, ST, and a dry cough. He had a fever the first day but none since. He feels much better this am. Drinking fluids and using Delsym.    Review of Systems  Constitutional: Negative.   HENT: Positive for congestion, postnasal drip, sinus pressure and sore throat.   Eyes: Negative.   Respiratory: Positive for cough.        Objective:   Physical Exam  Constitutional: He appears well-developed and well-nourished. No distress.  HENT:  Right Ear: External ear normal.  Left Ear: External ear normal.  Nose: Nose normal.  Mouth/Throat: Oropharynx is clear and moist.  Eyes: Conjunctivae are normal.  Neck: No thyromegaly present.  Pulmonary/Chest: Effort normal and breath sounds normal.  Lymphadenopathy:    He has no cervical adenopathy.          Assessment & Plan:  Viral URI. He sees to be at the end of it now. Recheck prn

## 2015-10-09 NOTE — Progress Notes (Signed)
Pre visit review using our clinic review tool, if applicable. No additional management support is needed unless otherwise documented below in the visit note. 

## 2016-01-11 ENCOUNTER — Encounter: Payer: Self-pay | Admitting: Family Medicine

## 2016-01-11 ENCOUNTER — Ambulatory Visit (INDEPENDENT_AMBULATORY_CARE_PROVIDER_SITE_OTHER): Payer: Commercial Managed Care - HMO | Admitting: Family Medicine

## 2016-01-11 VITALS — BP 159/98 | HR 79 | Temp 98.5°F | Ht 66.75 in | Wt 159.0 lb

## 2016-01-11 DIAGNOSIS — J209 Acute bronchitis, unspecified: Secondary | ICD-10-CM | POA: Diagnosis not present

## 2016-01-11 MED ORDER — AZITHROMYCIN 250 MG PO TABS: ORAL_TABLET | ORAL | Status: DC

## 2016-01-11 MED ORDER — HYDROCOD POLST-CPM POLST ER 10-8 MG/5ML PO SUER: 5.0000 mL | Freq: Two times a day (BID) | ORAL | Status: DC | PRN

## 2016-01-11 NOTE — Progress Notes (Signed)
Pre visit review using our clinic review tool, if applicable. No additional management support is needed unless otherwise documented below in the visit note. 

## 2016-01-11 NOTE — Progress Notes (Signed)
   Subjective:    Patient ID: Darrell Baldwin, male    DOB: July 11, 1946, 70 y.o.   MRN: [id number]  HPI Here for one week of PND, ST, chest congestion and coughing up yellow sputum. On Mucinex.    Review of Systems  Constitutional: Negative.   HENT: Positive for congestion, postnasal drip and sore throat. Negative for sinus pressure.   Eyes: Negative.   Respiratory: Positive for cough and chest tightness. Negative for shortness of breath and wheezing.        Objective:   Physical Exam  Constitutional: He appears well-developed and well-nourished. No distress.  HENT:  Right Ear: External ear normal.  Left Ear: External ear normal.  Nose: Nose normal.  Mouth/Throat: Oropharynx is clear and moist.  Eyes: Conjunctivae are normal.  Neck: No thyromegaly present.  Cardiovascular: Normal rate, regular rhythm, normal heart sounds and intact distal pulses.   Pulmonary/Chest: Effort normal. No respiratory distress. He has no wheezes. He has no rales.  Scattered rhonchi   Lymphadenopathy:    He has no cervical adenopathy.          Assessment & Plan:  Bronchitis, treat with a Zpack.  Nelwyn SalisburyFRY,Breindel Collier A, MD

## 2016-01-20 ENCOUNTER — Ambulatory Visit: Payer: Commercial Managed Care - HMO | Admitting: Family Medicine

## 2016-04-01 ENCOUNTER — Other Ambulatory Visit: Payer: Self-pay | Admitting: Family Medicine

## 2016-04-26 ENCOUNTER — Encounter: Payer: Self-pay | Admitting: Family Medicine

## 2016-04-26 ENCOUNTER — Ambulatory Visit (INDEPENDENT_AMBULATORY_CARE_PROVIDER_SITE_OTHER): Payer: Commercial Managed Care - HMO | Admitting: Family Medicine

## 2016-04-26 VITALS — BP 153/94 | HR 73 | Temp 98.1°F | Ht 66.75 in | Wt 164.0 lb

## 2016-04-26 DIAGNOSIS — Z23 Encounter for immunization: Secondary | ICD-10-CM | POA: Diagnosis not present

## 2016-04-26 DIAGNOSIS — I10 Essential (primary) hypertension: Secondary | ICD-10-CM | POA: Diagnosis not present

## 2016-04-26 MED ORDER — AMLODIPINE BESYLATE 10 MG PO TABS: 10.0000 mg | ORAL_TABLET | Freq: Every day | ORAL | 3 refills | Status: DC

## 2016-04-26 NOTE — Progress Notes (Signed)
Pre visit review using our clinic review tool, if applicable. No additional management support is needed unless otherwise documented below in the visit note. 

## 2016-04-26 NOTE — Progress Notes (Signed)
   Subjective:    Patient ID: Darrell Baldwin, male    DOB: 21-Aug-1945, 70 y.o.   MRN: [id number]  HPI Here for one week of headaches, which are centered on the top of the head. He has some mild allergy symptoms like sneezing and itchy eyes, but he denies sinus pressure or congestion. He admits to stopping taking his BP medications some months ago. He says the HCTZ made him urinate too much. His wife has checked his BP several times in the last week and has gotten readings in the 150s over 90s. No chest pain or SOB.    Review of Systems  Constitutional: Negative.   HENT: Positive for sneezing. Negative for congestion, ear pain, postnasal drip, sinus pressure and sore throat.   Eyes: Positive for itching. Negative for photophobia, pain, redness and visual disturbance.  Respiratory: Negative.   Cardiovascular: Negative.   Neurological: Positive for headaches.       Objective:   Physical Exam  Constitutional: He is oriented to person, place, and time. He appears well-developed and well-nourished.  HENT:  Right Ear: External ear normal.  Left Ear: External ear normal.  Nose: Nose normal.  Mouth/Throat: Oropharynx is clear and moist.  Eyes: Conjunctivae are normal.  Neck: No thyromegaly present.  Cardiovascular: Normal rate, regular rhythm, normal heart sounds and intact distal pulses.   Pulmonary/Chest: Effort normal and breath sounds normal.  Musculoskeletal: He exhibits no edema.  Lymphadenopathy:    He has no cervical adenopathy.  Neurological: He is alert and oriented to person, place, and time.          Assessment & Plan:  His headache is from high BP. We had a discussion about the importance of taking medications as prescribed. We agreed to stop the HCTZ. But he will restart the Amlodipine daily. Recheck in 3-4 weeks with a cpx and labs.  Nelwyn SalisburyFRY,Roxann Vierra A, MD

## 2016-05-06 ENCOUNTER — Telehealth: Payer: Self-pay | Admitting: Family Medicine

## 2016-05-06 NOTE — Telephone Encounter (Signed)
Fyi. Appointment scheduled for Monday at 11am.

## 2016-05-06 NOTE — Telephone Encounter (Signed)
[facility] - Client TELEPHONE ADVICE RECORD [facility]  Patient Name: Darrell Baldwin  DOB: 08/04/1945    Initial Comment husband BP 132/90 range, no symptoms. thinks he might need a better med.    Nurse Assessment  Nurse: Laural BenesJohnson, RN, Dondra SpryGail Date/Time Lamount Cohen(Eastern Time): 05/06/2016 9:40:00 AM  Confirm and document reason for call. If symptomatic, describe symptoms. You must click the next button to save text entered. ---Darrell Baldwin was placed on amolodipine and advised to stop HCTZ -- d/t frequency of urine. blood pressure is continuing to be in 130's over 90's range with no other symptoms present. 132/90, 136/92 having familial issues possible impending death of sibling  Has the patient traveled out of the country within the last 30 days? ---No  Does the patient have any new or worsening symptoms? ---Yes  Will a triage be completed? ---Yes  Related visit to physician within the last 2 weeks? ---Yes  Does the PT have any chronic conditions? (i.e. diabetes, asthma, etc.) ---Yes  List chronic conditions. ---HTN  Is this a behavioral health or substance abuse call? ---No     Guidelines    Guideline Title Affirmed Question Affirmed Notes  High Blood Pressure Wants doctor to measure BP    Final Disposition User   See PCP When Office is Open (within 3 days) Laural BenesJohnson, RN, Kennieth RadGail    Comments  FYI appointment given for Monday for recheck of HTN issues ; 05-09-2016 1045am arrival 1100am appt time with Dr. Gershon CraneStephen Fry   Referrals  REFERRED TO PCP OFFICE   Disagree/Comply: Comply

## 2016-05-09 ENCOUNTER — Ambulatory Visit (INDEPENDENT_AMBULATORY_CARE_PROVIDER_SITE_OTHER): Payer: Commercial Managed Care - HMO | Admitting: Family Medicine

## 2016-05-09 ENCOUNTER — Encounter: Payer: Self-pay | Admitting: Family Medicine

## 2016-05-09 VITALS — BP 150/92 | HR 74 | Temp 98.3°F | Ht 66.75 in | Wt 165.0 lb

## 2016-05-09 DIAGNOSIS — I10 Essential (primary) hypertension: Secondary | ICD-10-CM | POA: Diagnosis not present

## 2016-05-09 NOTE — Progress Notes (Signed)
Pre visit review using our clinic review tool, if applicable. No additional management support is needed unless otherwise documented below in the visit note. 

## 2016-05-09 NOTE — Progress Notes (Signed)
   Subjective:    Patient ID: Darrell Baldwin, male    DOB: 02/08/1946, 70 y.o.   MRN: [id number]  HPI Here to follow up on BP. His wife was alarmed last week when he had some readings in the 130s over 90s at home. He has felt fine. We saw him just 10 days ago and switched him from HCTZ to Amlodipine. Now in the past 2 days at home his readings have ben in the 120s over 70s.    Review of Systems  Constitutional: Negative.   Respiratory: Negative.   Cardiovascular: Negative.   Neurological: Negative.        Objective:   Physical Exam  Constitutional: He is oriented to person, place, and time. He appears well-developed and well-nourished.  Cardiovascular: Normal rate, regular rhythm, normal heart sounds and intact distal pulses.   Pulmonary/Chest: Effort normal and breath sounds normal.  Neurological: He is alert and oriented to person, place, and time.          Assessment & Plan:  His HTN is now well controlled. I reassured his wife that the Amlodipine needed some time to become effective and this it may be another week before it completely kicks in. They will monitor the BP and let us know if it goes back up.  Nelwyn SalisburyFRY,[name], MD

## 2016-05-24 ENCOUNTER — Encounter: Payer: Self-pay | Admitting: Family Medicine

## 2016-05-24 ENCOUNTER — Ambulatory Visit (INDEPENDENT_AMBULATORY_CARE_PROVIDER_SITE_OTHER): Payer: Commercial Managed Care - HMO | Admitting: Family Medicine

## 2016-05-24 VITALS — BP 166/106 | HR 61 | Temp 98.2°F | Ht 66.75 in | Wt 166.0 lb

## 2016-05-24 DIAGNOSIS — Z0000 Encounter for general adult medical examination without abnormal findings: Secondary | ICD-10-CM

## 2016-05-24 DIAGNOSIS — I10 Essential (primary) hypertension: Secondary | ICD-10-CM

## 2016-05-24 LAB — CBC WITH DIFFERENTIAL/PLATELET
BASOS ABS: 0 10*3/uL (ref 0.0–0.1)
Basophils Relative: 0.3 % (ref 0.0–3.0)
EOS PCT: 1.9 % (ref 0.0–5.0)
Eosinophils Absolute: 0.1 10*3/uL (ref 0.0–0.7)
HCT: 40.6 % (ref 39.0–52.0)
Hemoglobin: 13.9 g/dL (ref 13.0–17.0)
LYMPHS ABS: 1.7 10*3/uL (ref 0.7–4.0)
Lymphocytes Relative: 41.1 % (ref 12.0–46.0)
MCHC: 34.2 g/dL (ref 30.0–36.0)
MCV: 92.2 fl (ref 78.0–100.0)
MONO ABS: 0.3 10*3/uL (ref 0.1–1.0)
Monocytes Relative: 6.7 % (ref 3.0–12.0)
NEUTROS ABS: 2 10*3/uL (ref 1.4–7.7)
NEUTROS PCT: 50 % (ref 43.0–77.0)
PLATELETS: 227 10*3/uL (ref 150.0–400.0)
RBC: 4.4 Mil/uL (ref 4.22–5.81)
RDW: 13.8 % (ref 11.5–15.5)
WBC: 4 10*3/uL (ref 4.0–10.5)

## 2016-05-24 LAB — POC URINALSYSI DIPSTICK (AUTOMATED)
BILIRUBIN UA: NEGATIVE
Blood, UA: NEGATIVE
Glucose, UA: NEGATIVE
Ketones, UA: NEGATIVE
LEUKOCYTES UA: NEGATIVE
NITRITE UA: NEGATIVE
PH UA: 6
PROTEIN UA: NEGATIVE
Spec Grav, UA: >=1.03
Urobilinogen, UA: 0.2

## 2016-05-24 LAB — LIPID PANEL
CHOLESTEROL: 270 mg/dL — AB (ref 0–200)
HDL: 53 mg/dL (ref >39.00)
LDL Cholesterol: 192 mg/dL — ABNORMAL HIGH (ref 0–99)
NonHDL: 216.97
TRIGLYCERIDES: 127 mg/dL (ref 0.0–149.0)
Total CHOL/HDL Ratio: 5
VLDL: 25.4 mg/dL (ref 0.0–40.0)

## 2016-05-24 LAB — PSA: PSA: 1.27 ng/mL (ref 0.10–4.00)

## 2016-05-24 LAB — BASIC METABOLIC PANEL
BUN: 13 mg/dL (ref 6–23)
CHLORIDE: 105 meq/L (ref 96–112)
CO2: 28 meq/L (ref 19–32)
CREATININE: 1.11 mg/dL (ref 0.40–1.50)
Calcium: 9.5 mg/dL (ref 8.4–10.5)
GFR: 84.09 mL/min (ref >60.00)
Glucose, Bld: 95 mg/dL (ref 70–99)
Potassium: 4.3 mEq/L (ref 3.5–5.1)
Sodium: 142 mEq/L (ref 135–145)

## 2016-05-24 LAB — HEPATIC FUNCTION PANEL
ALBUMIN: 4.2 g/dL (ref 3.5–5.2)
ALT: 11 U/L (ref 0–53)
AST: 14 U/L (ref 0–37)
Alkaline Phosphatase: 109 U/L (ref 39–117)
Bilirubin, Direct: 0.1 mg/dL (ref 0.0–0.3)
Total Bilirubin: 0.6 mg/dL (ref 0.2–1.2)
Total Protein: 7.1 g/dL (ref 6.0–8.3)

## 2016-05-24 LAB — TSH: TSH: 0.56 u[IU]/mL (ref 0.35–4.50)

## 2016-05-24 NOTE — Progress Notes (Signed)
   Subjective:    Patient ID: Darrell Baldwin, male    DOB: January 01, 1946, 70 y.o.   MRN: [id number]  HPI 70 yr old male for a well exam. He feels well. His BP at home is stable in the 130s over 80s.    Review of Systems  Constitutional: Negative.   HENT: Negative.   Eyes: Negative.   Respiratory: Negative.   Cardiovascular: Negative.   Gastrointestinal: Negative.   Genitourinary: Negative.   Musculoskeletal: Negative.   Skin: Negative.   Neurological: Negative.   Psychiatric/Behavioral: Negative.        Objective:   Physical Exam  Constitutional: He is oriented to person, place, and time. He appears well-developed and well-nourished. No distress.  HENT:  Head: Normocephalic and atraumatic.  Right Ear: External ear normal.  Left Ear: External ear normal.  Nose: Nose normal.  Mouth/Throat: Oropharynx is clear and moist. No oropharyngeal exudate.  Eyes: Conjunctivae and EOM are normal. Pupils are equal, round, and reactive to light. Right eye exhibits no discharge. Left eye exhibits no discharge. No scleral icterus.  Neck: Neck supple. No JVD present. No tracheal deviation present. No thyromegaly present.  Cardiovascular: Normal rate, regular rhythm, normal heart sounds and intact distal pulses.  Exam reveals no gallop and no friction rub.   No murmur heard. EKG normal   Pulmonary/Chest: Effort normal and breath sounds normal. No respiratory distress. He has no wheezes. He has no rales. He exhibits no tenderness.  Abdominal: Soft. Bowel sounds are normal. He exhibits no distension and no mass. There is no tenderness. There is no rebound and no guarding.  Genitourinary: Rectum normal, prostate normal and penis normal. Rectal exam shows guaiac negative stool. No penile tenderness.  Musculoskeletal: Normal range of motion. He exhibits no edema or tenderness.  Lymphadenopathy:    He has no cervical adenopathy.  Neurological: He is alert and oriented to person, place, and time. He  has normal reflexes. No cranial nerve deficit. He exhibits normal muscle tone. Coordination normal.  Skin: Skin is warm and dry. No rash noted. He is not diaphoretic. No erythema. No pallor.  Psychiatric: He has a normal mood and affect. His behavior is normal. Judgment and thought content normal.          Assessment & Plan:  Well exam. We discussed diet and exercise. Get fasting labs today.  Nelwyn SalisburyFRY,[name], MD

## 2016-05-24 NOTE — Progress Notes (Signed)
Pre visit review using our clinic review tool, if applicable. No additional management support is needed unless otherwise documented below in the visit note. 

## 2016-05-27 ENCOUNTER — Other Ambulatory Visit: Payer: Self-pay | Admitting: Emergency Medicine

## 2016-05-27 MED ORDER — ATORVASTATIN CALCIUM 20 MG PO TABS: 20.0000 mg | ORAL_TABLET | Freq: Every day | ORAL | 3 refills | Status: DC

## 2016-06-21 ENCOUNTER — Telehealth: Payer: Self-pay | Admitting: Family Medicine

## 2016-06-21 MED ORDER — FLUTICASONE PROPIONATE 50 MCG/ACT NA SUSP: 2.0000 | Freq: Every day | NASAL | 11 refills | Status: DC

## 2016-06-21 NOTE — Telephone Encounter (Signed)
done

## 2016-06-24 ENCOUNTER — Telehealth: Payer: Self-pay | Admitting: Family Medicine

## 2016-06-24 MED ORDER — HYDROCODONE-HOMATROPINE 5-1.5 MG/5ML PO SYRP: 5.0000 mL | ORAL_SOLUTION | ORAL | 0 refills | Status: DC | PRN

## 2016-06-24 NOTE — Telephone Encounter (Signed)
done

## 2016-08-04 ENCOUNTER — Ambulatory Visit (INDEPENDENT_AMBULATORY_CARE_PROVIDER_SITE_OTHER): Payer: Commercial Managed Care - HMO | Admitting: Family Medicine

## 2016-08-04 ENCOUNTER — Encounter: Payer: Self-pay | Admitting: Family Medicine

## 2016-08-04 VITALS — BP 140/80 | HR 81 | Temp 98.1°F | Ht 66.75 in | Wt 168.2 lb

## 2016-08-04 DIAGNOSIS — M542 Cervicalgia: Secondary | ICD-10-CM | POA: Diagnosis not present

## 2016-08-04 DIAGNOSIS — G8929 Other chronic pain: Secondary | ICD-10-CM | POA: Diagnosis not present

## 2016-08-04 MED ORDER — HYDROCOD POLST-CPM POLST ER 10-8 MG/5ML PO SUER: 5.0000 mL | Freq: Two times a day (BID) | ORAL | 0 refills | Status: DC | PRN

## 2016-08-04 MED ORDER — IBUPROFEN 800 MG PO TABS: 800.0000 mg | ORAL_TABLET | Freq: Three times a day (TID) | ORAL | 5 refills | Status: DC | PRN

## 2016-08-04 NOTE — Progress Notes (Signed)
Pre visit review using our clinic review tool, if applicable. No additional management support is needed unless otherwise documented below in the visit note. 

## 2016-08-04 NOTE — Progress Notes (Signed)
   Subjective:    Patient ID: Darrell RaringCharles L Grubb, male    DOB: 11/17/1945, 71 y.o.   MRN: [id number]  HPI Here to discuss aching pains in the neck and upper shoulder areas. He has had these for about a year but they have been worse the past 2 months. He gets some relief with heat or BioFreeze. He has tried some of his wife's 800 mg Ibuprofen and this works very well. The pain mostly bothers him in bed at night. No pain or weakness in the arms.    Review of Systems  Constitutional: Negative.   Musculoskeletal: Positive for back pain, neck pain and neck stiffness.       Objective:   Physical Exam  Constitutional: He appears well-developed and well-nourished. No distress.  Cardiovascular: Normal rate, regular rhythm, normal heart sounds and intact distal pulses.   Pulmonary/Chest: Effort normal and breath sounds normal.  Musculoskeletal:  The neck is not tender ans has full ROM. He is tender over both superior trapezius muscles.           Assessment & Plan:  Neck pain, probable arthritis. Wrote for his own Ibuprofen to use prn.  Gershon CraneStephen Fry, MD

## 2016-08-09 ENCOUNTER — Telehealth: Payer: Self-pay | Admitting: Family Medicine

## 2016-08-09 NOTE — Telephone Encounter (Signed)
Called and let patient know that the paperwork has been sent back to the insurance company & we are just waiting on a response now.

## 2016-08-09 NOTE — Telephone Encounter (Signed)
° ° ° ° ° °  Pt is trying to find out if there is a prior auth on the below med   chlorpheniramine-HYDROcodone (TUSSIONEX PENNKINETIC ER) 10-8 MG/5ML SUER

## 2016-08-15 NOTE — Telephone Encounter (Signed)
Noted. I guess he can either pay cash for the Tussionex or else use Delsym OTC

## 2016-08-15 NOTE — Telephone Encounter (Signed)
PA denied. The medicare rule in the prescription drug manual says drugs used for the symptomatic relief of cough and colds are excluded from Medicare Part D coverage.

## 2016-08-16 NOTE — Telephone Encounter (Signed)
I spoke with pt and went over below information. 

## 2016-10-18 ENCOUNTER — Other Ambulatory Visit: Payer: Self-pay | Admitting: Family Medicine

## 2016-10-25 ENCOUNTER — Ambulatory Visit (INDEPENDENT_AMBULATORY_CARE_PROVIDER_SITE_OTHER): Payer: Commercial Managed Care - HMO | Admitting: Family Medicine

## 2016-10-25 ENCOUNTER — Encounter: Payer: Self-pay | Admitting: Family Medicine

## 2016-10-25 VITALS — BP 159/99 | HR 65 | Temp 98.3°F | Ht 66.75 in | Wt 169.0 lb

## 2016-10-25 DIAGNOSIS — G4700 Insomnia, unspecified: Secondary | ICD-10-CM

## 2016-10-25 DIAGNOSIS — M67441 Ganglion, right hand: Secondary | ICD-10-CM | POA: Diagnosis not present

## 2016-10-25 MED ORDER — TEMAZEPAM 30 MG PO CAPS: 30.0000 mg | ORAL_CAPSULE | Freq: Every evening | ORAL | 2 refills | Status: DC | PRN

## 2016-10-25 NOTE — Progress Notes (Signed)
   Subjective:    Patient ID: Darrell Baldwin, male    DOB: 05-22-1946, 71 y.o.   MRN: [id number]  HPI Here for 2 weeks of a painful lump on the right hand. No trauma hx. Ibuprofen helps a little. Also he has had trouble sleeping for a year or more. This includes both initial sleep and waking up throughout the night.    Review of Systems  Constitutional: Negative.   Respiratory: Negative.   Cardiovascular: Negative.   Musculoskeletal: Positive for arthralgias.  Neurological: Negative.   Psychiatric/Behavioral: Positive for sleep disturbance. Negative for agitation, confusion, decreased concentration, dysphoric mood and hallucinations. The patient is not nervous/anxious.        Objective:   Physical Exam  Constitutional: He is oriented to person, place, and time. He appears well-developed and well-nourished.  Cardiovascular: Normal rate, regular rhythm, normal heart sounds and intact distal pulses.   Pulmonary/Chest: Effort normal and breath sounds normal. No respiratory distress. He has no wheezes. He has no rales.  Musculoskeletal:  The right hand has a tender mobile firm round mass under the dorsal skin   Neurological: He is alert and oriented to person, place, and time.  Psychiatric: He has a normal mood and affect. His behavior is normal. Thought content normal.          Assessment & Plan:  He has a ganglion cyst on the hand. We will refer him to Hand Surgery. Try Temazepam for sleep. [name], MD

## 2016-10-25 NOTE — Progress Notes (Signed)
Pre visit review using our clinic review tool, if applicable. No additional management support is needed unless otherwise documented below in the visit note. 

## 2016-10-25 NOTE — Patient Instructions (Signed)
WE NOW OFFER   [facility] FAST TRACK!!!  SAME DAY Appointments for ACUTE CARE  Such as: Sprains, Injuries, cuts, abrasions, rashes, muscle pain, joint pain, back pain Colds, flu, sore throats, headache, allergies, cough, fever  Ear pain, sinus and eye infections Abdominal pain, nausea, vomiting, diarrhea, upset stomach Animal/insect bites  3 Easy Ways to Schedule: Walk-In Scheduling Call in scheduling Mychart Sign-up: [email or web address]         

## 2017-03-01 ENCOUNTER — Ambulatory Visit: Payer: Self-pay | Admitting: Family Medicine

## 2017-05-23 ENCOUNTER — Ambulatory Visit (INDEPENDENT_AMBULATORY_CARE_PROVIDER_SITE_OTHER): Payer: Self-pay

## 2017-05-23 DIAGNOSIS — Z23 Encounter for immunization: Secondary | ICD-10-CM

## 2017-05-29 ENCOUNTER — Ambulatory Visit: Payer: Commercial Managed Care - HMO | Admitting: Family Medicine

## 2017-05-29 ENCOUNTER — Encounter: Payer: Self-pay | Admitting: Family Medicine

## 2017-05-29 VITALS — BP 160/88 | Temp 98.1°F | Ht 66.75 in | Wt 170.0 lb

## 2017-05-29 DIAGNOSIS — J302 Other seasonal allergic rhinitis: Secondary | ICD-10-CM | POA: Insufficient documentation

## 2017-05-29 DIAGNOSIS — J209 Acute bronchitis, unspecified: Secondary | ICD-10-CM | POA: Diagnosis not present

## 2017-05-29 MED ORDER — AZITHROMYCIN 250 MG PO TABS: ORAL_TABLET | ORAL | 0 refills | Status: DC

## 2017-05-29 MED ORDER — HYDROCOD POLST-CPM POLST ER 10-8 MG/5ML PO SUER: 5.0000 mL | Freq: Two times a day (BID) | ORAL | 0 refills | Status: DC | PRN

## 2017-05-29 NOTE — Patient Instructions (Signed)
WE NOW OFFER   [facility] FAST TRACK!!!  SAME DAY Appointments for ACUTE CARE  Such as: Sprains, Injuries, cuts, abrasions, rashes, muscle pain, joint pain, back pain Colds, flu, sore throats, headache, allergies, cough, fever  Ear pain, sinus and eye infections Abdominal pain, nausea, vomiting, diarrhea, upset stomach Animal/insect bites  3 Easy Ways to Schedule: Walk-In Scheduling Call in scheduling Mychart Sign-up: [email or web address]         

## 2017-05-29 NOTE — Progress Notes (Signed)
   Subjective:    Patient ID: Darrell Baldwin, male    DOB: 03/08/1946, 71 y.o.   MRN: [id number]  HPI Here for 5 weeks of stuffy head, PND, and a cough. At first the cough was dry but one week ago it began to produce yellow sputum. No fever. He says e has trouble with allergies every year at this time. He tried Flonase a couple of times but never consistently.    Review of Systems  Constitutional: Negative.   HENT: Positive for congestion and postnasal drip. Negative for sinus pressure, sinus pain and sore throat.   Eyes: Negative.   Respiratory: Positive for cough.        Objective:   Physical Exam  Constitutional: He appears well-developed and well-nourished.  HENT:  Right Ear: External ear normal.  Left Ear: External ear normal.  Nose: Nose normal.  Mouth/Throat: Oropharynx is clear and moist.  Eyes: Conjunctivae are normal.  Neck: No thyromegaly present.  Cardiovascular: Normal rate, regular rhythm, normal heart sounds and intact distal pulses.  Pulmonary/Chest: Effort normal. No respiratory distress. He has no wheezes. He has no rales.  Soft rhonchi   Lymphadenopathy:    He has no cervical adenopathy.          Assessment & Plan:  He has seasonal allergies, and I suggested he try Flonase and Xyzal daily. I explained that these need to be used regularly for them to be effective. He has also developed a bronchitis, and he will take a Zpack for this. Gershon CraneStephen Arie Powell, MD

## 2017-07-28 ENCOUNTER — Ambulatory Visit (INDEPENDENT_AMBULATORY_CARE_PROVIDER_SITE_OTHER)
Admission: RE | Admit: 2017-07-28 | Discharge: 2017-07-28 | Disposition: A | Discharge status: Home / Self Care | Payer: Commercial Managed Care - HMO | Source: Ambulatory Visit | Attending: Family Medicine | Admitting: Family Medicine

## 2017-07-28 ENCOUNTER — Ambulatory Visit: Payer: Commercial Managed Care - HMO | Admitting: Family Medicine

## 2017-07-28 ENCOUNTER — Encounter: Payer: Self-pay | Admitting: Family Medicine

## 2017-07-28 VITALS — BP 150/90 | HR 80 | Temp 98.4°F | Wt 170.2 lb

## 2017-07-28 DIAGNOSIS — R05 Cough: Secondary | ICD-10-CM

## 2017-07-28 DIAGNOSIS — R053 Chronic cough: Secondary | ICD-10-CM

## 2017-07-28 MED ORDER — AMOXICILLIN-POT CLAVULANATE 875-125 MG PO TABS: 1.0000 | ORAL_TABLET | Freq: Two times a day (BID) | ORAL | 0 refills | Status: DC

## 2017-07-28 MED ORDER — HYDROCOD POLST-CPM POLST ER 10-8 MG/5ML PO SUER: 5.0000 mL | Freq: Two times a day (BID) | ORAL | 0 refills | Status: DC | PRN

## 2017-07-31 ENCOUNTER — Encounter: Payer: Self-pay | Admitting: Family Medicine

## 2017-07-31 NOTE — Progress Notes (Signed)
   Subjective:    Patient ID: Darrell Baldwin, male    DOB: Nov 14, 1945, 72 y.o.   MRN: [id number]  HPI Here for a cough that started over 2 months ago. It is usually dry but may produce clear sputum. No chest pain or SOB. No GERD symptoms. He is not taking an ACE inhibitor. He was given a Zpack for this a month ago but this made no difference. His wife is quite concerned. His brother is treated for lung cancer, though the brother is a smoker and Darrell Baldwin has never smoked.   Review of Systems  Constitutional: Negative.   Respiratory: Positive for cough. Negative for choking, chest tightness, shortness of breath and wheezing.   Cardiovascular: Negative.   Gastrointestinal: Negative.   Neurological: Negative.        Objective:   Physical Exam  Constitutional: He is oriented to person, place, and time. He appears well-developed and well-nourished.  HENT:  Right Ear: External ear normal.  Left Ear: External ear normal.  Nose: Nose normal.  Mouth/Throat: Oropharynx is clear and moist.  Eyes: Conjunctivae are normal.  Neck: No thyromegaly present.  Cardiovascular: Normal rate, regular rhythm, normal heart sounds and intact distal pulses.  Pulmonary/Chest: Effort normal and breath sounds normal. No respiratory distress. He has no wheezes. He has no rales.  Musculoskeletal: He exhibits no edema.  Lymphadenopathy:    He has no cervical adenopathy.  Neurological: He is alert and oriented to person, place, and time.          Assessment & Plan:  Chronic cough. He is given Augmentin to take for 10 days. He will get a CXR today as well. Recheck prn. Gershon CraneStephen Jaelin Fackler, MD

## 2017-08-01 ENCOUNTER — Ambulatory Visit: Payer: Commercial Managed Care - HMO | Admitting: Family Medicine

## 2017-08-07 ENCOUNTER — Telehealth: Payer: Self-pay | Admitting: Family Medicine

## 2017-08-07 DIAGNOSIS — R059 Cough, unspecified: Secondary | ICD-10-CM

## 2017-08-07 DIAGNOSIS — R05 Cough: Secondary | ICD-10-CM

## 2017-08-07 NOTE — Telephone Encounter (Signed)
Called and spoke with pt. Looks like results are in just the provider hasn't been able to review the results. Pt's wife would like to know these results ASAP her husband is still NOT feeling any better and he is still dealing with a persistent productive cough.

## 2017-08-07 NOTE — Telephone Encounter (Signed)
His CXR from 07-28-17 was totally normal

## 2017-08-07 NOTE — Telephone Encounter (Signed)
Copied from CRM [id number]. Topic: Quick Communication - Other Results >> Aug 07, 2017 11:08 AM Guinevere FerrariMorris, Cherise Fedder E, NT wrote: Patient's wife called and wanted to see if the X rays results were in and would like a call back.

## 2017-08-07 NOTE — Telephone Encounter (Signed)
Called and spoke with pt's wife. Wife advised and voiced understanding. Pt's wife is really concern her husband finished up the antibiotics today the the cough syrup medication did not help at all his wife stated.  Mrs.Kozinski would like for her husband to be set up for a referral to see someone about this cough that has been going on before Christmas. Or should she set up another Ov with Dr. Clent RidgesFry?

## 2017-08-07 NOTE — Telephone Encounter (Signed)
I did a referral for him to see Pulmonary soon

## 2017-08-08 NOTE — Telephone Encounter (Signed)
Called and spoke with pt's wife. Wife advised that referral has been set up.

## 2017-08-08 NOTE — Telephone Encounter (Signed)
Called pt's wife and left a VM to call back.

## 2017-08-28 ENCOUNTER — Telehealth: Payer: Self-pay | Admitting: Family Medicine

## 2017-08-28 NOTE — Telephone Encounter (Signed)
Spoke with pt. He just had questions about who/when about his pulm appt. Answered all questions. Nothing further needed.

## 2017-08-28 NOTE — Telephone Encounter (Signed)
Copied from CRM [phone]. Topic: Quick Communication - See Telephone Encounter >> Aug 28, 2017 12:02 PM Eston Mouldavis, Quest Tavenner B wrote: CRM for notification. See Telephone encounter for:   PTs wife is asking for a call to discuss pts recent referral to pulmonary    08/28/17.

## 2017-09-05 ENCOUNTER — Encounter: Payer: Self-pay | Admitting: Pulmonary Disease

## 2017-09-05 ENCOUNTER — Ambulatory Visit (INDEPENDENT_AMBULATORY_CARE_PROVIDER_SITE_OTHER): Payer: Commercial Managed Care - HMO | Admitting: Pulmonary Disease

## 2017-09-05 VITALS — BP 150/90 | HR 77 | Ht 67.0 in | Wt 169.0 lb

## 2017-09-05 DIAGNOSIS — R0683 Snoring: Secondary | ICD-10-CM | POA: Diagnosis not present

## 2017-09-05 DIAGNOSIS — R059 Cough, unspecified: Secondary | ICD-10-CM

## 2017-09-05 DIAGNOSIS — R05 Cough: Secondary | ICD-10-CM | POA: Diagnosis not present

## 2017-09-05 NOTE — Patient Instructions (Signed)
I have reviewed your chest x-ray which does not show any abnormality.  The lungs look normal I am glad that your cough has improved.  You can use over-the-counter antihistamine such as Zyrtec and Flonase nasal spray if it recurs. I suspect that you may have sleep apnea.  I like to get this evaluated by a sleep study. Follow-up in 1 month after sleep study for review.

## 2017-09-05 NOTE — Progress Notes (Signed)
Darrell Baldwin    [id number]    [birth year]  Primary Care Physician:Fry, [name], MD  Referring Physician: [name], MD [address] [place], [place] [address]  Chief complaint: Consult for chronic cough  HPI: 72 year old with history of hypertension.  Evaluated for chronic cough by Dr. Clent Ridges.  Complains of cough since October.  Treated with Z-Pak and Augmentin.  He was also prescribed antihistamine and steroid nasal spray.  He reports that the cough today has improved but still has occasional nonproductive cough.  Denies any dyspnea, wheezing, sputum production, hemoptysis. He had a chest x-ray in early January just does not show any acute abnormality.    Reports seasonal allergies, throat congestion.  Does not have any acid reflux.  Wife states that he has excessive snoring at night with daytime sleepiness.  He has never been evaluated for sleep apnea.  Pets: No pets Occupation: Worked as a Tree surgeon.  Currently retired. Exposures: No known exposures.  No mold at home, no hot tub. Smoking history: Never smoker Travel History: Lived in [place] all his life.  No significant travel.  Outpatient Encounter Medications as of 09/05/2017  Medication Sig  . amLODipine (NORVASC) 10 MG tablet TAKE 1 TABLET EVERY DAY  . atorvastatin (LIPITOR) 20 MG tablet Take 1 tablet (20 mg total) by mouth daily.  . chlorpheniramine-HYDROcodone (TUSSIONEX PENNKINETIC ER) 10-8 MG/5ML SUER Take 5 mLs by mouth every 12 (twelve) hours as needed for cough.  . fluticasone (FLONASE) 50 MCG/ACT nasal spray Place 2 sprays into both nostrils daily.  Marland Kitchen ibuprofen (ADVIL,MOTRIN) 800 MG tablet Take 1 tablet (800 mg total) by mouth every 8 (eight) hours as needed for mild pain.  . sildenafil (VIAGRA) 100 MG tablet Take 0.5-1 tablets (50-100 mg total) by mouth as needed for erectile dysfunction.  . temazepam (RESTORIL) 30 MG capsule Take 1 capsule (30 mg total) by mouth at  bedtime as needed for sleep.  . [DISCONTINUED] amoxicillin-clavulanate (AUGMENTIN) 875-125 MG tablet Take 1 tablet by mouth 2 (two) times daily.  . [DISCONTINUED] oxyCODONE-acetaminophen (PERCOCET) 10-325 MG tablet Take 1 tablet by mouth every 6 (six) hours as needed for pain.   No facility-administered encounter medications on file as of 09/05/2017.     Allergies as of 09/05/2017  . (No Known Allergies)    Past Medical History:  Diagnosis Date  . Allergic rhinitis   . Hypertension     Past Surgical History:  Procedure Laterality Date  . COLONOSCOPY  04/11/2014   per Dr. Juanda Chance, diverticulosis and internal hemorrhoids, no polyps, repeat in 10 yrs     Family History  Problem Relation Age of Onset  . Breast cancer Sister   . Diabetes Sister   . Lung cancer Brother   . Liver cancer Sister   . Diabetes Sister   . Colon cancer Neg Hx     Social History   Socioeconomic History  . Marital status: Married    Spouse name: Not on file  . Number of children: Not on file  . Years of education: Not on file  . Highest education level: Not on file  Social Needs  . Financial resource strain: Not on file  . Food insecurity - worry: Not on file  . Food insecurity - inability: Not on file  . Transportation needs - medical: Not on file  . Transportation needs - non-medical: Not on file  Occupational History  . Not  on file  Tobacco Use  . Smoking status: Never Smoker  . Smokeless tobacco: Never Used  Substance and Sexual Activity  . Alcohol use: No    Alcohol/week: 0.0 oz  . Drug use: No  . Sexual activity: Not on file  Other Topics Concern  . Not on file  Social History Narrative  . Not on file    Review of systems: Review of Systems  Constitutional: Negative for fever and chills.  HENT: Negative.   Eyes: Negative for blurred vision.  Respiratory: as per HPI  Cardiovascular: Negative for chest pain and palpitations.  Gastrointestinal: Negative for vomiting, diarrhea,  blood per rectum. Genitourinary: Negative for dysuria, urgency, frequency and hematuria.  Musculoskeletal: Negative for myalgias, back pain and joint pain.  Skin: Negative for itching and rash.  Neurological: Negative for dizziness, tremors, focal weakness, seizures and loss of consciousness.  Endo/Heme/Allergies: Negative for environmental allergies.  Psychiatric/Behavioral: Negative for depression, suicidal ideas and hallucinations.  All other systems reviewed and are negative.  Physical Exam: Blood pressure (!) 150/90, pulse 77, height 5\' 7"  (1.702 m), weight 169 lb (76.7 kg), SpO2 98 %. Gen:      No acute distress HEENT:  EOMI, sclera anicteric Neck:     No masses; no thyromegaly Lungs:    Clear to auscultation bilaterally; normal respiratory effort CV:         Regular rate and rhythm; no murmurs Abd:      + bowel sounds; soft, non-tender; no palpable masses, no distension Ext:    No edema; adequate peripheral perfusion Skin:      Warm and dry; no rash Neuro: alert and oriented x 3 Psych: normal mood and affect  Data Reviewed: Chest x-ray 07/28/17-no active cardiopulmonary disease.  I reviewed the images personally  CBC 05/24/16- WBC 4, eosinophil count 1.9%, absolute eosinophil count 100  Assessment:  Chronic cough, upper airway Likely from allergies, postnasal drip.  His symptoms have improved since the referral to pulmonary clinic. I have advised him to continue over-the-counter antihistamine and steroid nasal spray as needed. Reviewed chest x-ray with patient and wife.  I reassured them that there is no acute abnormality and no evidence of malignancy.  Suspected sleep apnea Untreated sleep apnea may be contributing to his cough and hypertension.  Schedule for a split-night sleep study.  Plan/Recommendations: -  OTC antihistamine, steroid nasal spray - Sleep study  [name] MD [facility] Pager [phone] 09/05/2017, 10:44 AM  CC: Nelwyn SalisburyFry,  [name], MD

## 2017-09-12 ENCOUNTER — Telehealth: Payer: Self-pay | Admitting: Pulmonary Disease

## 2017-09-12 DIAGNOSIS — R29818 Other symptoms and signs involving the nervous system: Secondary | ICD-10-CM

## 2017-09-17 ENCOUNTER — Encounter (HOSPITAL_BASED_OUTPATIENT_CLINIC_OR_DEPARTMENT_OTHER): Payer: Commercial Managed Care - HMO

## 2017-09-18 ENCOUNTER — Other Ambulatory Visit: Payer: Self-pay

## 2017-10-01 DIAGNOSIS — G4733 Obstructive sleep apnea (adult) (pediatric): Secondary | ICD-10-CM | POA: Diagnosis not present

## 2017-10-02 ENCOUNTER — Other Ambulatory Visit: Payer: Self-pay | Admitting: *Deleted

## 2017-10-02 DIAGNOSIS — R29818 Other symptoms and signs involving the nervous system: Secondary | ICD-10-CM

## 2017-10-03 DIAGNOSIS — G4733 Obstructive sleep apnea (adult) (pediatric): Secondary | ICD-10-CM | POA: Diagnosis not present

## 2017-10-05 ENCOUNTER — Telehealth: Payer: Self-pay

## 2017-10-05 NOTE — Telephone Encounter (Signed)
-----   Message from Chilton GreathousePraveen Mannam, MD sent at 10/05/2017  1:25 AM EDT ----- Sleep study shows mild OSA with low O2. Will discuss options during office visit.   ----- Message ----- From: Coralyn HellingSood, Vineet, MD Sent: 10/03/2017   8:29 AM To: Chilton GreathousePraveen Mannam, MD  Praveen,  Home sleep study 10/01/17 mild sleep apnea with AHI of 11.7 and SaO2 low 79%.  Thanks.  Vineet

## 2017-10-05 NOTE — Telephone Encounter (Signed)
Pt's spouse, Burna MortimerWanda [facility] [employer] [facility] is aware of below message and voiced her understanding. Nothing further is needed.

## 2017-10-12 ENCOUNTER — Ambulatory Visit: Payer: Self-pay

## 2017-10-12 NOTE — Telephone Encounter (Signed)
Noted  

## 2017-10-12 NOTE — Telephone Encounter (Signed)
Phone call from pt and his wife.  Reported they had just left a [employer] physical appt. for the pt.  Were made aware of the pt. having elevated BP; wife reported the following readings: 121/122 (this was clarified with wife, if there was an error in recording; she stated that was what was told to her), 127/91, 150/84, and 151/91.  Reported above readings were taken 15 min. apart; last reading taken at 2:00 PM.  The pt. denied any symptoms; denied headache, dizziness, blurry vision, numbness or weakness of extremities, confusion, or slurred speech.  Denied being under any additional stress.  Reviewed meds; questioned if he was taking his Amlodipine as ordered.  Stated he never started taking the Amlodipine.  Reviewed last 3 office visits BP readings; 150/90,160/88, 159/99.  Appt. Given to see PCP tomorrow.  Care Advice given per protocol.  Pt. and wife verb. Understanding.  Agreed with plan.          Reason for Disposition . Systolic BP  >= 160 OR Diastolic >= 100  Answer Assessment - Initial Assessment Questions 1. BLOOD PRESSURE: "What is the blood pressure?" "Did you take at least two measurements 5 minutes apart?"     BP 121/122 (clarified this reading; wife adamant that this was given to them)  Recheck on BP 127/91, 150/84, 151/91; readings reported to be taken 15 min. Apart; last reading @ 2:00 PM 2. 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 HOW: "How did you obtain the blood pressure?" (e.g., visiting nurse, automatic home BP monitor)     Digital machine 4. HISTORY: "Do you have a history of high blood pressure?"    No  5. MEDICATIONS: "Are you taking any medications for blood pressure?" "Have you missed any doses recently?"     The pt. was supposed to be taking Amlodipine 10 mg daily; stated he never started it.  6. OTHER SYMPTOMS: "Do you have any symptoms?" (e.g., headache, chest pain, blurred vision, difficulty breathing,  weakness)    Denied HA, blurred vision, dizziness, numbness/weakness of extremities, slurred speech  7. PREGNANCY: "Is there any chance you are pregnant?" "When was your last menstrual period?"     n/a  Protocols used: HIGH BLOOD PRESSURE-A-AH

## 2017-10-13 ENCOUNTER — Ambulatory Visit (INDEPENDENT_AMBULATORY_CARE_PROVIDER_SITE_OTHER): Payer: Commercial Managed Care - HMO | Admitting: Family Medicine

## 2017-10-13 ENCOUNTER — Encounter: Payer: Self-pay | Admitting: Family Medicine

## 2017-10-13 VITALS — BP 154/82 | HR 79 | Temp 98.2°F | Ht 67.0 in | Wt 169.8 lb

## 2017-10-13 DIAGNOSIS — I10 Essential (primary) hypertension: Secondary | ICD-10-CM

## 2017-10-13 MED ORDER — FLUTICASONE PROPIONATE 50 MCG/ACT NA SUSP: 2.0000 | Freq: Every day | NASAL | 11 refills | Status: DC

## 2017-10-13 MED ORDER — AMLODIPINE BESYLATE 10 MG PO TABS: 10.0000 mg | ORAL_TABLET | Freq: Every day | ORAL | 3 refills | Status: DC

## 2017-10-13 NOTE — Progress Notes (Signed)
   Subjective:    Patient ID: Darrell Baldwin, male    DOB: 05/26/1946, 72 y.o.   MRN: [id number]  HPI Here for high BP. We started him on Amlodipine several years ago and he did well for a time. Then his BP started going up. On several doctor visits in the past year I see his chart reflects readings of 150s over 100 a few times. 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 This came down a bit but was never lower than 158/102. He feels fine. After questioning him he admits to not taking any Amlodipine for over a year because he didn't think he needed it.    Review of Systems  Constitutional: Negative.   Respiratory: Negative.   Cardiovascular: Negative.   Neurological: Negative.        Objective:   Physical Exam  Constitutional: He is oriented to person, place, and time. He appears well-developed and well-nourished.  Cardiovascular: Normal rate, regular rhythm, normal heart sounds and intact distal pulses.  Pulmonary/Chest: Effort normal and breath sounds normal. No respiratory distress. He has no wheezes. He has no rales.  Musculoskeletal: He exhibits no edema.  Neurological: He is alert and oriented to person, place, and time.          Assessment & Plan:  HTN. I spoke to him about the importance of taking medication as prescribed and he agreed. He will resume taking Amlodipine 10 mg daily. Recheck here in 3 weeks.  Gershon CraneStephen Fry, MD

## 2017-10-17 ENCOUNTER — Ambulatory Visit (INDEPENDENT_AMBULATORY_CARE_PROVIDER_SITE_OTHER): Payer: Commercial Managed Care - HMO | Admitting: Pulmonary Disease

## 2017-10-17 ENCOUNTER — Encounter: Payer: Self-pay | Admitting: Pulmonary Disease

## 2017-10-17 VITALS — BP 136/82 | HR 78 | Ht 67.0 in | Wt 169.0 lb

## 2017-10-17 DIAGNOSIS — G4733 Obstructive sleep apnea (adult) (pediatric): Secondary | ICD-10-CM

## 2017-10-17 NOTE — Progress Notes (Signed)
Darrell Baldwin    [id number]    03/16/1946  Primary Care Physician:Fry, Tera MaterStephen A, MD  Referring Physician: Nelwyn SalisburyFry, [name], MD [address] [place], [place] [address]  Chief complaint: Follow-up for chronic cough, wheezing  HPI: 72 year old with history of hypertension.  Evaluated for chronic cough by Dr. Clent RidgesFry.  Complains of cough since October.  Treated with Z-Pak and Augmentin.  He was also prescribed antihistamine and steroid nasal spray.  He reports that the cough today has improved but still has occasional nonproductive cough.  Denies any dyspnea, wheezing, sputum production, hemoptysis. He had a chest x-ray in early January just does not show any acute abnormality.    Reports seasonal allergies, throat congestion.  Does not have any acid reflux.  Wife states that he has excessive snoring at night with daytime sleepiness.  He has never been evaluated for sleep apnea.  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 Exposures: No known exposures.  No mold at home, no hot tub. Smoking history: Never smoker Travel History: [place].  No significant travel.  Interim history: States that cough has completely resolved with over-the-counter antihistamine and Flonase nasal spray.  He has no new complaints today He had a sleep study done this month and is here for review of results.  Outpatient Encounter Medications as of 10/17/2017  Medication Sig  . amLODipine (NORVASC) 10 MG tablet Take 1 tablet (10 mg total) by mouth daily.  Marland Kitchen. atorvastatin (LIPITOR) 20 MG tablet Take 1 tablet (20 mg total) by mouth daily.  . chlorpheniramine-HYDROcodone (TUSSIONEX PENNKINETIC ER) 10-8 MG/5ML SUER Take 5 mLs by mouth every 12 (twelve) hours as needed for cough.  . dextromethorphan (DELSYM) 30 MG/5ML liquid Take by mouth as needed for cough.  . fluticasone (FLONASE) 50 MCG/ACT nasal spray Place 2 sprays into both  nostrils daily.  Marland Kitchen. ibuprofen (ADVIL,MOTRIN) 800 MG tablet Take 1 tablet (800 mg total) by mouth every 8 (eight) hours as needed for mild pain.  Marland Kitchen. loratadine (CLARITIN) 10 MG tablet Take 10 mg by mouth 2 (two) times daily.  . sildenafil (VIAGRA) 100 MG tablet Take 0.5-1 tablets (50-100 mg total) by mouth as needed for erectile dysfunction.  . temazepam (RESTORIL) 30 MG capsule Take 1 capsule (30 mg total) by mouth at bedtime as needed for sleep.   No facility-administered encounter medications on file as of 10/17/2017.     Allergies as of 10/17/2017  . (No Known Allergies)    Past Medical History:  Diagnosis Date  . Allergic rhinitis   . Hypertension     Past Surgical History:  Procedure Laterality Date  . COLONOSCOPY  04/11/2014   per Dr. Juanda ChanceBrodie, diverticulosis and internal hemorrhoids, no polyps, repeat in 10 yrs     Family History  Problem Relation Age of Onset  . Breast cancer Sister   . Diabetes Sister   . Lung cancer Brother   . Liver cancer Sister   . Diabetes Sister   . Colon cancer Neg Hx     Social History   Socioeconomic History  . Marital status: Married    Spouse name: Not on file  . Number of children: Not on file  . Years of education: Not on file  . Highest education level: Not on file  Occupational History  . Not on file  Social Needs  . Financial resource strain: Not on  file  . Food insecurity:    Worry: Not on file    Inability: Not on file  . Transportation needs:    Medical: Not on file    Non-medical: Not on file  Tobacco Use  . Smoking status: Never Smoker  . Smokeless tobacco: Never Used  Substance and Sexual Activity  . Alcohol use: No    Alcohol/week: 0.0 oz  . Drug use: No  . Sexual activity: Not on file  Lifestyle  . Physical activity:    Days per week: Not on file    Minutes per session: Not on file  . Stress: Not on file  Relationships  . Social connections:    Talks on phone: Not on file    Gets together: Not on file     Attends religious service: Not on file    Active member of club or organization: Not on file    Attends meetings of clubs or organizations: Not on file    Relationship status: Not on file  . Intimate partner violence:    Fear of current or ex partner: Not on file    Emotionally abused: Not on file    Physically abused: Not on file    Forced sexual activity: Not on file  Other Topics Concern  . Not on file  Social History Narrative  . Not on file   Review of systems: Review of Systems  Constitutional: Negative for fever and chills.  HENT: Negative.   Eyes: Negative for blurred vision.  Respiratory: as per HPI  Cardiovascular: Negative for chest pain and palpitations.  Gastrointestinal: Negative for vomiting, diarrhea, blood per rectum. Genitourinary: Negative for dysuria, urgency, frequency and hematuria.  Musculoskeletal: Negative for myalgias, back pain and joint pain.  Skin: Negative for itching and rash.  Neurological: Negative for dizziness, tremors, focal weakness, seizures and loss of consciousness.  Endo/Heme/Allergies: Negative for environmental allergies.  Psychiatric/Behavioral: Negative for depression, suicidal ideas and hallucinations.  All other systems reviewed and are negative.  Physical Exam: Blood pressure 136/82, pulse 78, height 5\' 7"  (1.702 m), weight 169 lb (76.7 kg), SpO2 100 %. Gen:      No acute distress HEENT:  EOMI, sclera anicteric Neck:     No masses; no thyromegaly Lungs:    Clear to auscultation bilaterally; normal respiratory effort CV:         Regular rate and rhythm; no murmurs Abd:      + bowel sounds; soft, non-tender; no palpable masses, no distension Ext:    No edema; adequate peripheral perfusion Skin:      Warm and dry; no rash Neuro: alert and oriented x 3 Psych: normal mood and affect  Data Reviewed: Chest x-ray 07/28/17-no active cardiopulmonary disease.  I reviewed the images personally  CBC 05/24/16- WBC 4, eosinophil count  1.9%, absolute eosinophil count 100  Sleep study 10/01/17-mild sleep apnea, AHI 11.7, low O2 sat 79%.  Assessment:  Chronic cough, upper airway Likely from allergies, postnasal drip.  His symptoms have improved since the referral to pulmonary clinic. He will continue over-the-counter antihistamine and steroid nasal spray as needed.  Mild sleep apnea Confirmed on recent home sleep study.  Start AutoSet CPAP 5-15 Return to clinic in 2-3 months for review of download.  Plan/Recommendations: - OTC antihistamine, steroid nasal spray as needed. - Start AutoSet CPAP 5-15.  Chilton Greathouse MD [facility] Pager [phone] 10/17/2017, 10:59 AM  CC: [name], MD

## 2017-10-17 NOTE — Addendum Note (Signed)
Addended by: Velvet BatheAULFIELD, Kaedyn Polivka L on: 10/17/2017 11:35 AM   Modules accepted: Orders

## 2017-10-17 NOTE — Addendum Note (Signed)
Addended by: Velvet BatheAULFIELD, ASHLEY L on: 10/17/2017 11:18 AM   Modules accepted: Orders

## 2017-10-17 NOTE — Patient Instructions (Signed)
Sleep study shows mild sleep apnea with low oxygen levels during the night. We will start you on AutoSet CPAP 5-15. Return to clinic in 2-3 months with download.

## 2017-11-16 NOTE — Telephone Encounter (Signed)
[facility] ordered this encounter

## 2017-12-20 ENCOUNTER — Ambulatory Visit: Payer: Commercial Managed Care - HMO | Admitting: Pulmonary Disease

## 2018-05-04 ENCOUNTER — Encounter: Payer: Commercial Managed Care - HMO | Admitting: Family Medicine

## 2018-05-08 ENCOUNTER — Encounter: Payer: Self-pay | Admitting: Family Medicine

## 2018-05-08 ENCOUNTER — Ambulatory Visit (INDEPENDENT_AMBULATORY_CARE_PROVIDER_SITE_OTHER): Payer: Commercial Managed Care - HMO | Admitting: Family Medicine

## 2018-05-08 VITALS — BP 124/86 | HR 64 | Temp 98.0°F | Ht 66.0 in | Wt 167.4 lb

## 2018-05-08 DIAGNOSIS — N401 Enlarged prostate with lower urinary tract symptoms: Secondary | ICD-10-CM

## 2018-05-08 DIAGNOSIS — N138 Other obstructive and reflux uropathy: Secondary | ICD-10-CM

## 2018-05-08 DIAGNOSIS — Z9109 Other allergy status, other than to drugs and biological substances: Secondary | ICD-10-CM | POA: Diagnosis not present

## 2018-05-08 DIAGNOSIS — G8929 Other chronic pain: Secondary | ICD-10-CM | POA: Diagnosis not present

## 2018-05-08 DIAGNOSIS — M542 Cervicalgia: Secondary | ICD-10-CM

## 2018-05-08 DIAGNOSIS — N529 Male erectile dysfunction, unspecified: Secondary | ICD-10-CM | POA: Diagnosis not present

## 2018-05-08 DIAGNOSIS — G4700 Insomnia, unspecified: Secondary | ICD-10-CM

## 2018-05-08 DIAGNOSIS — Z23 Encounter for immunization: Secondary | ICD-10-CM | POA: Diagnosis not present

## 2018-05-08 DIAGNOSIS — I10 Essential (primary) hypertension: Secondary | ICD-10-CM | POA: Diagnosis not present

## 2018-05-08 LAB — CBC WITH DIFFERENTIAL/PLATELET
BASOS ABS: 0 10*3/uL (ref 0.0–0.1)
Basophils Relative: 0.8 % (ref 0.0–3.0)
EOS ABS: 0.1 10*3/uL (ref 0.0–0.7)
EOS PCT: 2.6 % (ref 0.0–5.0)
HCT: 42.2 % (ref 39.0–52.0)
HEMOGLOBIN: 14.3 g/dL (ref 13.0–17.0)
LYMPHS ABS: 1.7 10*3/uL (ref 0.7–4.0)
Lymphocytes Relative: 43 % (ref 12.0–46.0)
MCHC: 33.9 g/dL (ref 30.0–36.0)
MCV: 93.3 fl (ref 78.0–100.0)
MONO ABS: 0.3 10*3/uL (ref 0.1–1.0)
Monocytes Relative: 7.2 % (ref 3.0–12.0)
NEUTROS PCT: 46.4 % (ref 43.0–77.0)
Neutro Abs: 1.8 10*3/uL (ref 1.4–7.7)
Platelets: 226 10*3/uL (ref 150.0–400.0)
RBC: 4.53 Mil/uL (ref 4.22–5.81)
RDW: 13.9 % (ref 11.5–15.5)
WBC: 3.9 10*3/uL — AB (ref 4.0–10.5)

## 2018-05-08 LAB — POC URINALSYSI DIPSTICK (AUTOMATED)
Bilirubin, UA: NEGATIVE
Glucose, UA: NEGATIVE
KETONES UA: NEGATIVE
Leukocytes, UA: NEGATIVE
Nitrite, UA: NEGATIVE
PH UA: 5.5 (ref 5.0–8.0)
PROTEIN UA: POSITIVE — AB
RBC UA: NEGATIVE
Urobilinogen, UA: 0.2 E.U./dL

## 2018-05-08 LAB — BASIC METABOLIC PANEL
BUN: 17 mg/dL (ref 6–23)
CALCIUM: 9.2 mg/dL (ref 8.4–10.5)
CHLORIDE: 106 meq/L (ref 96–112)
CO2: 29 mEq/L (ref 19–32)
CREATININE: 1.22 mg/dL (ref 0.40–1.50)
GFR: 74.99 mL/min (ref >60.00)
Glucose, Bld: 91 mg/dL (ref 70–99)
Potassium: 4.4 mEq/L (ref 3.5–5.1)
Sodium: 141 mEq/L (ref 135–145)

## 2018-05-08 LAB — HEPATIC FUNCTION PANEL
ALBUMIN: 4.3 g/dL (ref 3.5–5.2)
ALT: 11 U/L (ref 0–53)
AST: 12 U/L (ref 0–37)
Alkaline Phosphatase: 112 U/L (ref 39–117)
Bilirubin, Direct: 0.1 mg/dL (ref 0.0–0.3)
Total Bilirubin: 0.6 mg/dL (ref 0.2–1.2)
Total Protein: 7.1 g/dL (ref 6.0–8.3)

## 2018-05-08 LAB — LIPID PANEL
CHOL/HDL RATIO: 5
Cholesterol: 256 mg/dL — ABNORMAL HIGH (ref 0–200)
HDL: 47.9 mg/dL (ref >39.00)
LDL CALC: 179 mg/dL — AB (ref 0–99)
NonHDL: 207.63
Triglycerides: 142 mg/dL (ref 0.0–149.0)
VLDL: 28.4 mg/dL (ref 0.0–40.0)

## 2018-05-08 LAB — TSH: TSH: 0.53 u[IU]/mL (ref 0.35–4.50)

## 2018-05-08 LAB — PSA: PSA: 2.28 ng/mL (ref 0.10–4.00)

## 2018-05-08 MED ORDER — SILDENAFIL CITRATE 100 MG PO TABS: 100.0000 mg | ORAL_TABLET | ORAL | 11 refills | Status: AC | PRN

## 2018-05-08 MED ORDER — ATORVASTATIN CALCIUM 20 MG PO TABS: 20.0000 mg | ORAL_TABLET | Freq: Every day | ORAL | 3 refills | Status: DC

## 2018-05-08 NOTE — Progress Notes (Signed)
   Subjective:    Patient ID: Darrell Baldwin, male    DOB: 1946-04-03, 72 y.o.   MRN: [id number]  HPI Here to follow up on issues and to ask about neck and bilateral shoulder pains. These pains have bothered him for a year but are getting worse. He uses heat and Ibuprofen with mixed results. He wants to see an Orthopedist about this. His BP has been stable. He has felt well for the most part. He still has sleep trouble from time to time.    Review of Systems  Constitutional: Negative.   HENT: Negative.   Eyes: Negative.   Respiratory: Negative.   Cardiovascular: Negative.   Gastrointestinal: Negative.   Genitourinary: Negative.   Musculoskeletal: Positive for arthralgias, neck pain and neck stiffness.  Skin: Negative.   Neurological: Negative.   Psychiatric/Behavioral: Positive for sleep disturbance.       Objective:   Physical Exam  Constitutional: He is oriented to person, place, and time. He appears well-developed and well-nourished. No distress.  HENT:  Head: Normocephalic and atraumatic.  Right Ear: External ear normal.  Left Ear: External ear normal.  Nose: Nose normal.  Mouth/Throat: Oropharynx is clear and moist. No oropharyngeal exudate.  Eyes: Pupils are equal, round, and reactive to light. Conjunctivae and EOM are normal. Right eye exhibits no discharge. Left eye exhibits no discharge. No scleral icterus.  Neck: Neck supple. No JVD present. No tracheal deviation present. No thyromegaly present.  Cardiovascular: Normal rate, regular rhythm, normal heart sounds and intact distal pulses. Exam reveals no gallop and no friction rub.  No murmur heard. Pulmonary/Chest: Effort normal and breath sounds normal. No respiratory distress. He has no wheezes. He has no rales. He exhibits no tenderness.  Abdominal: Soft. Bowel sounds are normal. He exhibits no distension and no mass. There is no tenderness. There is no rebound and no guarding.  Genitourinary: Rectum normal,  prostate normal and penis normal. Rectal exam shows guaiac negative stool. No penile tenderness.  Musculoskeletal: Normal range of motion. He exhibits no edema or tenderness.  Lymphadenopathy:    He has no cervical adenopathy.  Neurological: He is alert and oriented to person, place, and time. He has normal reflexes. He displays normal reflexes. No cranial nerve deficit. He exhibits normal muscle tone. Coordination normal.  Skin: Skin is warm and dry. No rash noted. He is not diaphoretic. No erythema. No pallor.  Psychiatric: He has a normal mood and affect. His behavior is normal. Judgment and thought content normal.          Assessment & Plan:  His HTN is stable. His ED is stable. He prefers to avoid sleeping agents if possible. We will refer him to Orthopedics for the neck pain. Get fasting labs to day to check lipids, etc.  [name], MD

## 2018-05-15 ENCOUNTER — Ambulatory Visit (INDEPENDENT_AMBULATORY_CARE_PROVIDER_SITE_OTHER): Payer: Commercial Managed Care - HMO | Admitting: Orthopaedic Surgery

## 2018-05-15 ENCOUNTER — Encounter (INDEPENDENT_AMBULATORY_CARE_PROVIDER_SITE_OTHER): Payer: Self-pay | Admitting: Orthopaedic Surgery

## 2018-05-15 ENCOUNTER — Ambulatory Visit (INDEPENDENT_AMBULATORY_CARE_PROVIDER_SITE_OTHER): Payer: Commercial Managed Care - HMO

## 2018-05-15 DIAGNOSIS — M542 Cervicalgia: Secondary | ICD-10-CM | POA: Diagnosis not present

## 2018-05-15 NOTE — Progress Notes (Signed)
Office Visit Note   Patient: Darrell Baldwin           Date of Birth: 09-12-1945           MRN: [id number] Visit Date: 05/15/2018              Requested by: [name], MD [address] [place], [place] [address] PCP: [name], MD   Assessment & Plan: Visit Diagnoses:  1. Cervicalgia     Plan: Neck pain secondary to multilevel degenerative disc disease.  On exam he has atrophy and weakness in the ulnar nerve distribution.  We will order a EMG/NCS to better localize the area of impingement.  He will follow-up in approximately 2 weeks when this is complete.  Follow-Up Instructions: Return in about 2 weeks (around 05/29/2018).   Orders:  Orders Placed This Encounter  Procedures  . XR Cervical Spine 2 or 3 views  . Ambulatory referral to Physical Medicine Rehab   No orders of the defined types were placed in this encounter.     Procedures: No procedures performed   Clinical Data: No additional findings.   Subjective: Chief Complaint  Patient presents with  . Neck - Pain    HPI  72 year old male with over 10 years of chronic neck pain that radiates to the bilateral traps.  He denies any specific injury but has a long history of working as a Scientist, water quality and in Cytogeneticist.  Pain is worse at night when trying to sleep and prevents him from doing so.  He gets some relief with heat and rest.  He denies any pain with shoulder movement.  He does report some tingling in the fifth digits of his left hand.  He also notes that for the past 7 years she has had decreased muscle mass and that left hand as well as weakness.  He denies any injury to his wrist or elbow.  No associated skin changes  Review of Systems See HPI  Objective: Vital Signs: There were no vitals taken for this visit.  Physical Exam  GEN: Awake, alert, no acute distress Pulmonary: Breathing unlabored  Ortho Exam  Cervical spine: No obvious deformity No bony tenderness to  palpation Full range of motion of the neck Patient has 5/5 strength in the right upper extremity.  In the left upper extremity he has 3+/5 strength in the intrinsic hand muscles (ulnar nerve distribution).  Otherwise 5/5 strength. Mild decreased sensation in the left fifth digit to light touch Negative Spurling's bilaterally  Left hand: Patient has obvious atrophy of the interosseous and muscles Clawing deformity of the fourth and fifth digits. He has weakness with finger abduction and abduction. Decreased sensation in the fifth digit to light touch    Specialty Comments:  No specialty comments available.  Imaging: Xr Cervical Spine 2 Or 3 Views  Result Date: 05/15/2018 Severe multilevel degenerative disc disease with anterior spurring of the vertebral bodies.    PMFS History: Patient Active Problem List   Diagnosis Date Noted  . OSA (obstructive sleep apnea) 10/17/2017  . Seasonal allergies 05/29/2017  . Insomnia 10/25/2016  . Chronic neck pain 08/04/2016  . Low back pain with left-sided sciatica 12/24/2014  . Erectile dysfunction 08/15/2013  . Environmental allergies 04/25/2013  . HTN (hypertension) 04/02/2013   Past Medical History:  Diagnosis Date  . Allergic rhinitis   . Hypertension     Family History  Problem Relation Age of Onset  . Breast cancer  Sister   . Diabetes Sister   . Lung cancer Brother   . Liver cancer Sister   . Diabetes Sister   . Colon cancer Neg Hx     Past Surgical History:  Procedure Laterality Date  . COLONOSCOPY  04/11/2014   per Dr. Juanda Chance, diverticulosis and internal hemorrhoids, no polyps, repeat in 10 yrs    Social History   Occupational History  . Not on file  Tobacco Use  . Smoking status: Never Smoker  . Smokeless tobacco: Never Used  Substance and Sexual Activity  . Alcohol use: No    Alcohol/week: 0.0 standard drinks  . Drug use: No  . Sexual activity: Not on file

## 2018-05-16 ENCOUNTER — Other Ambulatory Visit: Payer: Self-pay | Admitting: Family Medicine

## 2018-05-16 MED ORDER — ATORVASTATIN CALCIUM 40 MG PO TABS: 40.0000 mg | ORAL_TABLET | Freq: Every day | ORAL | 3 refills | Status: DC

## 2018-06-12 ENCOUNTER — Ambulatory Visit (INDEPENDENT_AMBULATORY_CARE_PROVIDER_SITE_OTHER): Payer: Commercial Managed Care - HMO | Admitting: Physical Medicine and Rehabilitation

## 2018-06-12 ENCOUNTER — Encounter (INDEPENDENT_AMBULATORY_CARE_PROVIDER_SITE_OTHER): Payer: Self-pay | Admitting: Physical Medicine and Rehabilitation

## 2018-06-12 DIAGNOSIS — R202 Paresthesia of skin: Secondary | ICD-10-CM

## 2018-06-12 NOTE — Progress Notes (Addendum)
Darrell Baldwin Adkins - 72 y.o. male MRN [id number]  Date of birth: 08/30/1945  Office Visit Note: **ADDENDUM ULNAR NERVE ENTRAPMENT, will not let me change actual report but did change in body of note Visit Date: 06/12/2018 PCP: Nelwyn SalisburyFry, [name], MD Referred by: Nelwyn SalisburyFry, [name], MD  Subjective: Chief Complaint  Patient presents with  . Left Shoulder - Pain  . Left Arm - Numbness, Tingling  . Left Hand - Tingling, Numbness   HPI: Darrell Baldwin Finnigan is a 72 y.o. male who comes in today For electrodiagnostic study of the left upper extremity as requested by Dr. Glee ArvinMichael Xu.  He is a right-hand-dominant gentleman who still tries to stay pretty active who reports probably 15-year history of left hand pain with numbness tingling and weakness.  This numbness is tingling is in the ring and fifth digit.  He denies any specific injury.  He denies any radicular complaints.  He denies any right-sided complaints.  He does have a history of neck pain which keeps him up at night sometimes it refers to the left shoulder.  He remarks that at some point he had some type of electrodiagnostic study on the left but when he tells me what they were doing is really not one that I really known about or could really tell what they were doing on his left arm at the time but this was many years ago.  ROS Otherwise per HPI.  Assessment & Plan: Visit Diagnoses:  1. 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 The lesion is characterized by sensory and motor demyelination with evidence of significant axonal injury.  Because of the severity of the nerve injury is very hard to locate whether this is occurring at the elbow or the wrist.  Looking at the data I would favor that this may be more at the wrist given that there is more denervation findings of the distal muscles compared to  proximal.  When there is this much atrophy is hard to see on the nerve conduction where there is a drop in velocity.  Unfortunately, the denervation findings showed no active motor unit action potentials in the ulnar innervated muscles of the hand and this portends a poor outcome.   There is no significant electrodiagnostic evidence of any other focal nerve entrapment, brachial plexopathy or cervical radiculopathy.   Recommendations: 1.  Follow-up with referring physician. 2.  Continue current management of symptoms. 3.  Suggest surgical evaluation.    Meds & Orders: No orders of the defined types were placed in this encounter.   Orders Placed This Encounter  Procedures  . NCV with EMG (electromyography)    Follow-up: Return for  Glee ArvinMichael Xu, M.D..   Procedures: No procedures performed  EMG & NCV Findings: Evaluation of the left ulnar motor nerve showed prolonged distal onset latency (8.0 ms), reduced amplitude (0.0 mV), decreased conduction velocity (B Elbow-Wrist, 37 m/s), and decreased conduction velocity (A Elbow-B Elbow, 22 m/s).  The left median (across palm) sensory nerve showed no response (Palm).  The left ulnar sensory nerve showed prolonged distal peak latency (5.2 ms), reduced amplitude (4.2 V), and decreased conduction velocity (Wrist-5th Digit, 27 m/s).  All remaining nerves (as indicated in the following tables) were within normal limits.    Needle evaluation of the left first dorsal interosseous and the left abductor digiti minimi  muscles showed decreased insertional activity, widespread spontaneous activity, and diminished recruitment.  The left flexor digitorum profundus and the left flexor carpi ulnaris muscles showed diminished recruitment.  All remaining muscles (as indicated in the following table) showed no evidence of electrical instability.    Impression: The above electrodiagnostic study is ABNORMAL and reveals evidence of a very severe left median nerve  entrapment  affecting sensory and motor components. The lesion is characterized by sensory and motor demyelination with evidence of significant axonal injury.  Because of the severity of the nerve injury is very hard to locate whether this is occurring at the elbow or the wrist.  Looking at the data I would favor that this may be more at the wrist given that there is more denervation findings of the distal muscles compared to proximal.  When there is this much atrophy is hard to see on the nerve conduction where there is a drop in velocity.  Unfortunately, the denervation findings showed no active motor unit action potentials in the ulnar innervated muscles of the hand and this portends a poor outcome.   There is no significant electrodiagnostic evidence of any other focal nerve entrapment, brachial plexopathy or cervical radiculopathy.   Recommendations: 1.  Follow-up with referring physician. 2.  Continue current management of symptoms. 3.  Suggest surgical evaluation.  ___________________________ Naaman Plummer FAAPMR Board Certified, American Board of Physical Medicine and Rehabilitation    Nerve Conduction Studies Anti Sensory Summary Table   Stim Site NR Peak (ms) Norm Peak (ms) P-T Amp (V) Norm P-T Amp Site1 Site2 Delta-P (ms) Dist (cm) Vel (m/s) Norm Vel (m/s)  Left Median Acr Palm Anti Sensory (2nd Digit)  33.1C  Wrist    3.4 <3.6 23.6 >10 Wrist Palm  0.0    Palm *NR  <2.0          Left Radial Anti Sensory (Base 1st Digit)  32.6C  Wrist    2.4 <3.1 19.9  Wrist Base 1st Digit 2.4 0.0    Left Ulnar Anti Sensory (5th Digit)  33C  Wrist    *5.2 <3.7 *4.2 >15.0 Wrist 5th Digit 5.2 14.0 *27 >38   Motor Summary Table   Stim Site NR Onset (ms) Norm Onset (ms) O-P Amp (mV) Norm O-P Amp Site1 Site2 Delta-0 (ms) Dist (cm) Vel (m/s) Norm Vel (m/s)  Left Median Motor (Abd Poll Brev)  32.6C  Wrist    3.2 <4.2 6.5 >5 Elbow Wrist 4.6 24.0 52 >50  Elbow    7.8  4.7         Left Ulnar  Motor (Abd Dig Min)  32.5C  Wrist    *8.0 <4.2 *0.0 >3 B Elbow Wrist 5.4 20.0 *37 >53  B Elbow    13.4  0.0  A Elbow B Elbow 4.6 10.0 *22 >53  A Elbow    18.0  0.0          EMG   Side Muscle Nerve Root Ins Act Fibs Psw Amp Dur Poly Recrt Int Dennie Bible Comment  Left Abd Poll Brev Median C8-T1 Nml Nml Nml Nml Nml 0 Nml Nml   Left 1stDorInt Ulnar C8-T1 *Decr *4+ *4+ Nml Nml 0 *Reduced Nml no muap  Left PronatorTeres Median C6-7 Nml Nml Nml Nml Nml 0 Nml Nml   Left Biceps Musculocut C5-6 Nml Nml Nml Nml Nml 0 Nml Nml   Left Deltoid Axillary C5-6 Nml Nml Nml Nml Nml 0 Nml Nml   Left FlexDigProf Ulnar C8,T1 Nml  Nml Nml Nml Nml 0 *Reduced Nml   Left FlexCarpiUln Ulnar C8,T1 Nml Nml Nml Nml Nml 0 *Reduced Nml   Left ABD Dig Min Ulnar C8-T1 *Decr *4+ *4+ Nml Nml 0 *Reduced Nml no muap    Nerve Conduction Studies Anti Sensory Left/Right Comparison   Stim Site Baldwin Lat (ms) R Lat (ms) Baldwin-R Lat (ms) Baldwin Amp (V) R Amp (V) Baldwin-R Amp (%) Site1 Site2 Baldwin Vel (m/s) R Vel (m/s) Baldwin-R Vel (m/s)  Median Acr Palm Anti Sensory (2nd Digit)  33.1C  Wrist 3.4   23.6   Wrist Palm     Palm             Radial Anti Sensory (Base 1st Digit)  32.6C  Wrist 2.4   19.9   Wrist Base 1st Digit     Ulnar Anti Sensory (5th Digit)  33C  Wrist *5.2   *4.2   Wrist 5th Digit *27     Motor Left/Right Comparison   Stim Site Baldwin Lat (ms) R Lat (ms) Baldwin-R Lat (ms) Baldwin Amp (mV) R Amp (mV) Baldwin-R Amp (%) Site1 Site2 Baldwin Vel (m/s) R Vel (m/s) Baldwin-R Vel (m/s)  Median Motor (Abd Poll Brev)  32.6C  Wrist 3.2   6.5   Elbow Wrist 52    Elbow 7.8   4.7         Ulnar Motor (Abd Dig Min)  32.5C  Wrist *8.0   *0.0   B Elbow Wrist *37    B Elbow 13.4   0.0   A Elbow B Elbow *22    A Elbow 18.0   0.0            Waveforms:            Clinical History: No specialty comments available.   He reports that he has never smoked. He has never used smokeless tobacco. No results for input(s): HGBA1C, LABURIC in the last 8760 hours.  Objective:  VS:  HT:     WT:   BMI:     BP:   HR: bpm  TEMP: ( )  RESP:  Physical Exam  Constitutional: He is oriented to person, place, and time.  Musculoskeletal: He exhibits no edema or tenderness.  Inspection reveals significant atrophy of the left FDI, hand intrinsics and ADM but no atrophy of the bilateral APB or right FDI or right hand intrinsics. There is no swelling, color changes, allodynia or dystrophic changes. There is 5 out of 5 strength in the bilateral wrist extension and long finger flexion but 1 out of 4 with left finger abduction.  There is decreased sensation in the ulnar nerve distribution on the left.  Patient has a positive Benedictine sign and clawing of the left hand.  Neurological: He is alert and oriented to person, place, and time. He exhibits normal muscle tone. Coordination normal.  Skin: Skin is warm and dry. No rash noted. No erythema.    Ortho Exam Imaging: No results found.  Past Medical/Family/Surgical/Social History: Medications & Allergies reviewed per EMR, new medications updated. Patient Active Problem List   Diagnosis Date Noted  . OSA (obstructive sleep apnea) 10/17/2017  . Seasonal allergies 05/29/2017  . Insomnia 10/25/2016  . Chronic neck pain 08/04/2016  . Low back pain with left-sided sciatica 12/24/2014  . Erectile dysfunction 08/15/2013  . Environmental allergies 04/25/2013  . HTN (hypertension) 04/02/2013   Past Medical History:  Diagnosis Date  . Allergic rhinitis   . Hypertension  Family History  Problem Relation Age of Onset  . Breast cancer Sister   . Diabetes Sister   . Lung cancer Brother   . Liver cancer Sister   . Diabetes Sister   . Colon cancer Neg Hx    Past Surgical History:  Procedure Laterality Date  . COLONOSCOPY  04/11/2014   per Dr. Juanda Chance, diverticulosis and internal hemorrhoids, no polyps, repeat in 10 yrs    Social History   Occupational History  . Not on file  Tobacco Use  . Smoking status: Never Smoker  .  Smokeless tobacco: Never Used  Substance and Sexual Activity  . Alcohol use: No    Alcohol/week: 0.0 standard drinks  . Drug use: No  . Sexual activity: Not on file

## 2018-06-12 NOTE — Progress Notes (Signed)
  Numeric Pain Rating Scale and Functional Assessment Average Pain 6   In the last MONTH (on 0-10 scale) has pain interfered with the following?  1. General activity like being  able to carry out your everyday physical activities such as walking, climbing stairs, carrying groceries, or moving a chair?  Rating(4)     

## 2018-06-14 NOTE — Procedures (Signed)
EMG & NCV Findings: Evaluation of the left ulnar motor nerve showed prolonged distal onset latency (8.0 ms), reduced amplitude (0.0 mV), decreased conduction velocity (B Elbow-Wrist, 37 m/s), and decreased conduction velocity (A Elbow-B Elbow, 22 m/s).  The left median (across palm) sensory nerve showed no response (Palm).  The left ulnar sensory nerve showed prolonged distal peak latency (5.2 ms), reduced amplitude (4.2 V), and decreased conduction velocity (Wrist-5th Digit, 27 m/s).  All remaining nerves (as indicated in the following tables) were within normal limits.    Needle evaluation of the left first dorsal interosseous and the left abductor digiti minimi muscles showed decreased insertional activity, widespread spontaneous activity, and diminished recruitment.  The left flexor digitorum profundus and the left flexor carpi ulnaris muscles showed diminished recruitment.  All remaining muscles (as indicated in the following table) showed no evidence of electrical instability.    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 The lesion is characterized by sensory and motor demyelination with evidence of significant axonal injury.  Because of the severity of the nerve injury is very hard to locate whether this is occurring at the elbow or the wrist.  Looking at the data I would favor that this may be more at the wrist given that there is more denervation findings of the distal muscles compared to proximal.  When there is this much atrophy is hard to see on the nerve conduction where there is a drop in velocity.  Unfortunately, the denervation findings showed no active motor unit action potentials in the ulnar innervated muscles of the hand and this portends a poor outcome.   There is no significant electrodiagnostic evidence of any other focal nerve entrapment, brachial plexopathy or  cervical radiculopathy.   Recommendations: 1.  Follow-up with referring physician. 2.  Continue current management of symptoms. 3.  Suggest surgical evaluation.  ___________________________ Naaman PlummerFred Taleshia Luff FAAPMR Board Certified, American Board of Physical Medicine and Rehabilitation    Nerve Conduction Studies Anti Sensory Summary Table   Stim Site NR Peak (ms) Norm Peak (ms) P-T Amp (V) Norm P-T Amp Site1 Site2 Delta-P (ms) Dist (cm) Vel (m/s) Norm Vel (m/s)  Left Median Acr Palm Anti Sensory (2nd Digit)  33.1C  Wrist    3.4 <3.6 23.6 >10 Wrist Palm  0.0    Palm *NR  <2.0          Left Radial Anti Sensory (Base 1st Digit)  32.6C  Wrist    2.4 <3.1 19.9  Wrist Base 1st Digit 2.4 0.0    Left Ulnar Anti Sensory (5th Digit)  33C  Wrist    *5.2 <3.7 *4.2 >15.0 Wrist 5th Digit 5.2 14.0 *27 >38   Motor Summary Table   Stim Site NR Onset (ms) Norm Onset (ms) O-P Amp (mV) Norm O-P Amp Site1 Site2 Delta-0 (ms) Dist (cm) Vel (m/s) Norm Vel (m/s)  Left Median Motor (Abd Poll Brev)  32.6C  Wrist    3.2 <4.2 6.5 >5 Elbow Wrist 4.6 24.0 52 >50  Elbow    7.8  4.7         Left Ulnar Motor (Abd Dig Min)  32.5C  Wrist    *8.0 <4.2 *0.0 >3 B Elbow Wrist 5.4 20.0 *37 >53  B Elbow    13.4  0.0  A Elbow B Elbow 4.6 10.0 *22 >53  A Elbow    18.0  0.0  EMG   Side Muscle Nerve Root Ins Act Fibs Psw Amp Dur Poly Recrt Int Dennie Bible Comment  Left Abd Poll Brev Median C8-T1 Nml Nml Nml Nml Nml 0 Nml Nml   Left 1stDorInt Ulnar C8-T1 *Decr *4+ *4+ Nml Nml 0 *Reduced Nml no muap  Left PronatorTeres Median C6-7 Nml Nml Nml Nml Nml 0 Nml Nml   Left Biceps Musculocut C5-6 Nml Nml Nml Nml Nml 0 Nml Nml   Left Deltoid Axillary C5-6 Nml Nml Nml Nml Nml 0 Nml Nml   Left FlexDigProf Ulnar C8,T1 Nml Nml Nml Nml Nml 0 *Reduced Nml   Left FlexCarpiUln Ulnar C8,T1 Nml Nml Nml Nml Nml 0 *Reduced Nml   Left ABD Dig Min Ulnar C8-T1 *Decr *4+ *4+ Nml Nml 0 *Reduced Nml no muap    Nerve Conduction  Studies Anti Sensory Left/Right Comparison   Stim Site L Lat (ms) R Lat (ms) L-R Lat (ms) L Amp (V) R Amp (V) L-R Amp (%) Site1 Site2 L Vel (m/s) R Vel (m/s) L-R Vel (m/s)  Median Acr Palm Anti Sensory (2nd Digit)  33.1C  Wrist 3.4   23.6   Wrist Palm     Palm             Radial Anti Sensory (Base 1st Digit)  32.6C  Wrist 2.4   19.9   Wrist Base 1st Digit     Ulnar Anti Sensory (5th Digit)  33C  Wrist *5.2   *4.2   Wrist 5th Digit *27     Motor Left/Right Comparison   Stim Site L Lat (ms) R Lat (ms) L-R Lat (ms) L Amp (mV) R Amp (mV) L-R Amp (%) Site1 Site2 L Vel (m/s) R Vel (m/s) L-R Vel (m/s)  Median Motor (Abd Poll Brev)  32.6C  Wrist 3.2   6.5   Elbow Wrist 52    Elbow 7.8   4.7         Ulnar Motor (Abd Dig Min)  32.5C  Wrist *8.0   *0.0   B Elbow Wrist *37    B Elbow 13.4   0.0   A Elbow B Elbow *22    A Elbow 18.0   0.0            Waveforms:

## 2018-06-19 ENCOUNTER — Ambulatory Visit (INDEPENDENT_AMBULATORY_CARE_PROVIDER_SITE_OTHER): Payer: Self-pay | Admitting: Orthopaedic Surgery

## 2018-08-15 ENCOUNTER — Other Ambulatory Visit: Payer: Self-pay | Admitting: Family Medicine

## 2018-08-15 DIAGNOSIS — E785 Hyperlipidemia, unspecified: Secondary | ICD-10-CM

## 2018-08-16 ENCOUNTER — Other Ambulatory Visit (INDEPENDENT_AMBULATORY_CARE_PROVIDER_SITE_OTHER): Payer: Medicare HMO

## 2018-08-16 DIAGNOSIS — E785 Hyperlipidemia, unspecified: Secondary | ICD-10-CM | POA: Diagnosis not present

## 2018-08-16 LAB — LIPID PANEL
Cholesterol: 235 mg/dL — ABNORMAL HIGH (ref 0–200)
HDL: 45.9 mg/dL (ref >39.00)
LDL Cholesterol: 168 mg/dL — ABNORMAL HIGH (ref 0–99)
NonHDL: 189.45
Total CHOL/HDL Ratio: 5
Triglycerides: 105 mg/dL (ref 0.0–149.0)
VLDL: 21 mg/dL (ref 0.0–40.0)

## 2018-08-20 ENCOUNTER — Ambulatory Visit (INDEPENDENT_AMBULATORY_CARE_PROVIDER_SITE_OTHER): Payer: Medicare HMO | Admitting: Family Medicine

## 2018-08-20 ENCOUNTER — Encounter: Payer: Self-pay | Admitting: Family Medicine

## 2018-08-20 VITALS — BP 144/98 | HR 77 | Temp 98.5°F | Wt 173.1 lb

## 2018-08-20 DIAGNOSIS — M109 Gout, unspecified: Secondary | ICD-10-CM | POA: Diagnosis not present

## 2018-08-20 DIAGNOSIS — J209 Acute bronchitis, unspecified: Secondary | ICD-10-CM

## 2018-08-20 MED ORDER — AMOXICILLIN-POT CLAVULANATE 875-125 MG PO TABS: 1.0000 | ORAL_TABLET | Freq: Two times a day (BID) | ORAL | 0 refills | Status: DC

## 2018-08-20 MED ORDER — METHYLPREDNISOLONE ACETATE 80 MG/ML IJ SUSP
120.0000 mg | Freq: Once | INTRAMUSCULAR | Status: AC
  Administered 2018-08-20: 120 mg via INTRAMUSCULAR

## 2018-08-20 MED ORDER — PREDNISONE 10 MG PO TABS: 10.0000 mg | ORAL_TABLET | Freq: Two times a day (BID) | ORAL | 0 refills | Status: DC

## 2018-08-20 NOTE — Progress Notes (Signed)
   Subjective:    Patient ID: Darrell Baldwin, male    DOB: [birth year], 73 y.o.   MRN: [id number]  HPI Here for 2 issues. First about 2 weeks ago he developed chest congestion and coughing up yellow sputum. No chest pain or SOB or fever. On Mucinex. Second 2 days he had the sudden onset of swelling and pain in the right great toe. No recent trauma. He has never had this before.    Review of Systems  Constitutional: Negative.   HENT: Positive for congestion and postnasal drip. Negative for sinus pressure, sinus pain and sore throat.   Eyes: Negative.   Respiratory: Positive for cough and chest tightness.   Musculoskeletal: Positive for arthralgias and joint swelling.       Objective:   Physical Exam Constitutional:      Appearance: Normal appearance.  HENT:     Right Ear: Tympanic membrane and ear canal normal.     Left Ear: Tympanic membrane and ear canal normal.     Nose: Nose normal.     Mouth/Throat:     Pharynx: Oropharynx is clear.  Eyes:     Conjunctiva/sclera: Conjunctivae normal.  Pulmonary:     Effort: Pulmonary effort is normal. No respiratory distress.     Breath sounds: No wheezing or rales.     Comments: Scattered rhonchi  Musculoskeletal:     Comments: The right great toe is red, warm, swollen and tender over the MTP   Lymphadenopathy:     Cervical: No cervical adenopathy.  Neurological:     Mental Status: He is alert.           Assessment & Plan:  He has a bronchitis and also a gout attack in the toe. We will give him a steroid shot, some Prednisone tablets to use prn, and a course of Augmentin.  [name], MD

## 2018-08-20 NOTE — Addendum Note (Signed)
Addended by: Marcellus Scott on: 08/20/2018 04:28 PM   Modules accepted: Orders

## 2018-08-29 ENCOUNTER — Telehealth: Payer: Self-pay | Admitting: *Deleted

## 2018-08-29 DIAGNOSIS — E785 Hyperlipidemia, unspecified: Secondary | ICD-10-CM

## 2018-08-29 MED ORDER — ATORVASTATIN CALCIUM 80 MG PO TABS: 80.0000 mg | ORAL_TABLET | Freq: Every day | ORAL | 3 refills | Status: DC

## 2018-08-29 NOTE — Telephone Encounter (Signed)
Copied from CRM [id number]. Topic: General - Call Back - No Documentation >> Aug 29, 2018  2:12 PM Angela Nevin wrote: Reason for CRM: Patient returning call to office- No documentation   The cholesterol is a little better but still too high. Increase Lipitor to 80 mg a day. Call in #90 with 3 rf. Recheck lipids and a liver panel in 90 days   Spoke with pt and he is aware of results and of new dose.  Lab appt scheduled for 90 days out.

## 2018-09-13 ENCOUNTER — Encounter: Payer: Self-pay | Admitting: Family Medicine

## 2018-09-13 ENCOUNTER — Ambulatory Visit (INDEPENDENT_AMBULATORY_CARE_PROVIDER_SITE_OTHER): Payer: Medicare HMO | Admitting: Family Medicine

## 2018-09-13 VITALS — BP 142/88 | HR 81 | Temp 98.1°F | Wt 170.0 lb

## 2018-09-13 DIAGNOSIS — E785 Hyperlipidemia, unspecified: Secondary | ICD-10-CM | POA: Diagnosis not present

## 2018-09-13 DIAGNOSIS — R252 Cramp and spasm: Secondary | ICD-10-CM

## 2018-09-13 NOTE — Progress Notes (Signed)
   Subjective:    Patient ID: Darrell Baldwin, male    DOB: [birth year], 73 y.o.   MRN: [id number]  HPI Here to discuss his cholesterol medication. Last month we increased the Lipitor from 40 mg daily to 80 mg daily. Before the dose change he was experiencing some occasional leg cramps at night, but these were tolerable. Since the dose increase. These muscle cramps have gotten worse. They are more frequent and more intense.    Review of Systems  Constitutional: Negative.   Respiratory: Negative.   Cardiovascular: Negative.   Musculoskeletal: Positive for myalgias.       Objective:   Physical Exam Constitutional:      Appearance: Normal appearance.  Cardiovascular:     Rate and Rhythm: Normal rate and regular rhythm.     Pulses: Normal pulses.     Heart sounds: Normal heart sounds.  Pulmonary:     Effort: Pulmonary effort is normal.     Breath sounds: Normal breath sounds.  Neurological:     Mental Status: He is alert.           Assessment & Plan:  He is having muscle cramps as a side effect of the Lipitor, so we will decrease this back to 1/2 tablet a day (40 mg). Recheck lipids as planned. [name], MD

## 2018-09-17 ENCOUNTER — Ambulatory Visit: Payer: Self-pay | Admitting: Family Medicine

## 2018-09-17 NOTE — Telephone Encounter (Signed)
  I returned his call.    See triage notes.   He is having bad leg cramps from the Lipitor even after cutting the pill in half at Dr. Claris Che instruction last Friday.  I have sent these triage notes to Dr. Claris Che nurse pool for further disposition.   Reason for Disposition . Caller has URGENT medication question about med that PCP prescribed and triager unable to answer question    Lipitor is causing bad leg cramps.  Answer Assessment - Initial Assessment Questions 1. SYMPTOMS: "Do you have any symptoms?"     Started Lipitor 2 weeks ago.  80 mg.     I'm urinating more than usual after starting it.   I drink plenty of water.    I started getting cramps in  My left calf and then my left thigh.   Then it went to both legs.    When I sit down or lay down I get cramps.   2. SEVERITY: If symptoms are present, ask "Are they mild, moderate or severe?"     Bad when I am sitting or laying down.   Last week I took it every other day and it did not help I still had cramps.   I've cut it in half per Dr. Claris Che instructions last Friday.   It still didn't help the cramps.  Protocols used: MEDICATION QUESTION CALL-A-AH

## 2018-09-17 NOTE — Telephone Encounter (Signed)
Lm x 1 for the pt.

## 2018-09-17 NOTE — Telephone Encounter (Signed)
Dr. Fry please advise 

## 2018-09-17 NOTE — Telephone Encounter (Signed)
Tell him to stop the Lipitor completely. Drink fluids and take 1-2 magnesium tablets (400 mg) a day for awhile

## 2018-09-18 ENCOUNTER — Encounter (INDEPENDENT_AMBULATORY_CARE_PROVIDER_SITE_OTHER): Payer: Self-pay | Admitting: Orthopaedic Surgery

## 2018-09-18 ENCOUNTER — Ambulatory Visit (INDEPENDENT_AMBULATORY_CARE_PROVIDER_SITE_OTHER): Payer: Self-pay

## 2018-09-18 ENCOUNTER — Ambulatory Visit (INDEPENDENT_AMBULATORY_CARE_PROVIDER_SITE_OTHER): Payer: Medicare HMO | Admitting: Orthopaedic Surgery

## 2018-09-18 DIAGNOSIS — G5622 Lesion of ulnar nerve, left upper limb: Secondary | ICD-10-CM | POA: Diagnosis not present

## 2018-09-18 DIAGNOSIS — M79645 Pain in left finger(s): Secondary | ICD-10-CM | POA: Insufficient documentation

## 2018-09-18 NOTE — Progress Notes (Signed)
   Office Visit Note   Patient: Darrell Baldwin           Date of Birth: 1946-01-27           MRN: [id number] Visit Date: 09/18/2018              Requested by: [name], MD [address] [place], [place] [address] PCP: [name], MD   Assessment & Plan: Visit Diagnoses:  1. Cubital tunnel syndrome, left     Plan: Impression is severe left cubital tunnel syndrome.  We have discussed surgical intervention to include a cubital tunnel release.  We have also discussed that this will likely not improve his symptoms, but will prevent them from worsening.  Risks, benefits and possible occasions reviewed.  Rehab recovery time discussed.  All questions were answered.  Follow-Up Instructions: Return for post-op.   Orders:  Orders Placed This Encounter  Procedures  . XR Finger Little Left   No orders of the defined types were placed in this encounter.     Procedures: No procedures performed   Clinical Data: No additional findings.   Subjective: Chief Complaint  Patient presents with  . Left Hand - Pain    HPI patient is a pleasant 73 year old gentleman who presents to our clinic today with concerns about his left hand ring finger.  He has started to have worsening claw hand and interosseous atrophy.  He does have decreased sensation to the ulnar nerve distribution.  nerve conduction study in November 2019 showed a very severe left ulnar nerve entrapment.   Review of Systems as detailed in HPI.  All others reviewed and are negative.   Objective: Vital Signs: There were no vitals taken for this visit.  Physical Exam well-developed well-nourished gentleman in no acute distress.  Alert and oriented x3.  Ortho Exam examination of his left upper extremity reveals a call finger to the ring finger.  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  Full grip strength.  Specialty Comments:  No specialty comments available.  Imaging: No new imaging   PMFS  History: Patient Active Problem List   Diagnosis Date Noted  . Pain in left finger(s) 09/18/2018  . Dyslipidemia 09/13/2018  . OSA (obstructive sleep apnea) 10/17/2017  . Seasonal allergies 05/29/2017  . Insomnia 10/25/2016  . Chronic neck pain 08/04/2016  . Low back pain with left-sided sciatica 12/24/2014  . Erectile dysfunction 08/15/2013  . Environmental allergies 04/25/2013  . HTN (hypertension) 04/02/2013   Past Medical History:  Diagnosis Date  . Allergic rhinitis   . Hypertension     Family History  Problem Relation Age of Onset  . Breast cancer Sister   . Diabetes Sister   . Lung cancer Brother   . Liver cancer Sister   . Diabetes Sister   . Colon cancer Neg Hx     Past Surgical History:  Procedure Laterality Date  . COLONOSCOPY  04/11/2014   per Dr. Juanda Chance, diverticulosis and internal hemorrhoids, no polyps, repeat in 10 yrs    Social History   Occupational History  . Not on file  Tobacco Use  . Smoking status: Never Smoker  . Smokeless tobacco: Never Used  Substance and Sexual Activity  . Alcohol use: No    Alcohol/week: 0.0 standard drinks  . Drug use: No  . Sexual activity: Not on file

## 2018-09-27 NOTE — Telephone Encounter (Signed)
Called and spoke with pt and he stated that he did stop the lipitor and has been drinking lots of fluids and taking the magnesium.  He stated that he has not had any leg cramps in the last 3 days.

## 2018-11-26 ENCOUNTER — Other Ambulatory Visit: Payer: Self-pay

## 2018-11-26 ENCOUNTER — Other Ambulatory Visit (INDEPENDENT_AMBULATORY_CARE_PROVIDER_SITE_OTHER): Payer: Medicare HMO

## 2018-11-26 DIAGNOSIS — E785 Hyperlipidemia, unspecified: Secondary | ICD-10-CM

## 2018-11-26 LAB — HEPATIC FUNCTION PANEL
ALT: 9 U/L (ref 0–53)
AST: 12 U/L (ref 0–37)
Albumin: 4 g/dL (ref 3.5–5.2)
Alkaline Phosphatase: 99 U/L (ref 39–117)
Bilirubin, Direct: 0.1 mg/dL (ref 0.0–0.3)
Total Bilirubin: 0.6 mg/dL (ref 0.2–1.2)
Total Protein: 6.4 g/dL (ref 6.0–8.3)

## 2018-11-26 LAB — LIPID PANEL
Cholesterol: 254 mg/dL — ABNORMAL HIGH (ref 0–200)
HDL: 43.1 mg/dL (ref >39.00)
LDL Cholesterol: 184 mg/dL — ABNORMAL HIGH (ref 0–99)
NonHDL: 211.33
Total CHOL/HDL Ratio: 6
Triglycerides: 136 mg/dL (ref 0.0–149.0)
VLDL: 27.2 mg/dL (ref 0.0–40.0)

## 2019-04-16 IMAGING — DX DG CHEST 2V
2 series · 2 of 2 positions shown · non-contrast
Comparison: None.

CLINICAL DATA: Patient with cough and congestion.

EXAM:
CHEST  2 VIEW

[chest pa]
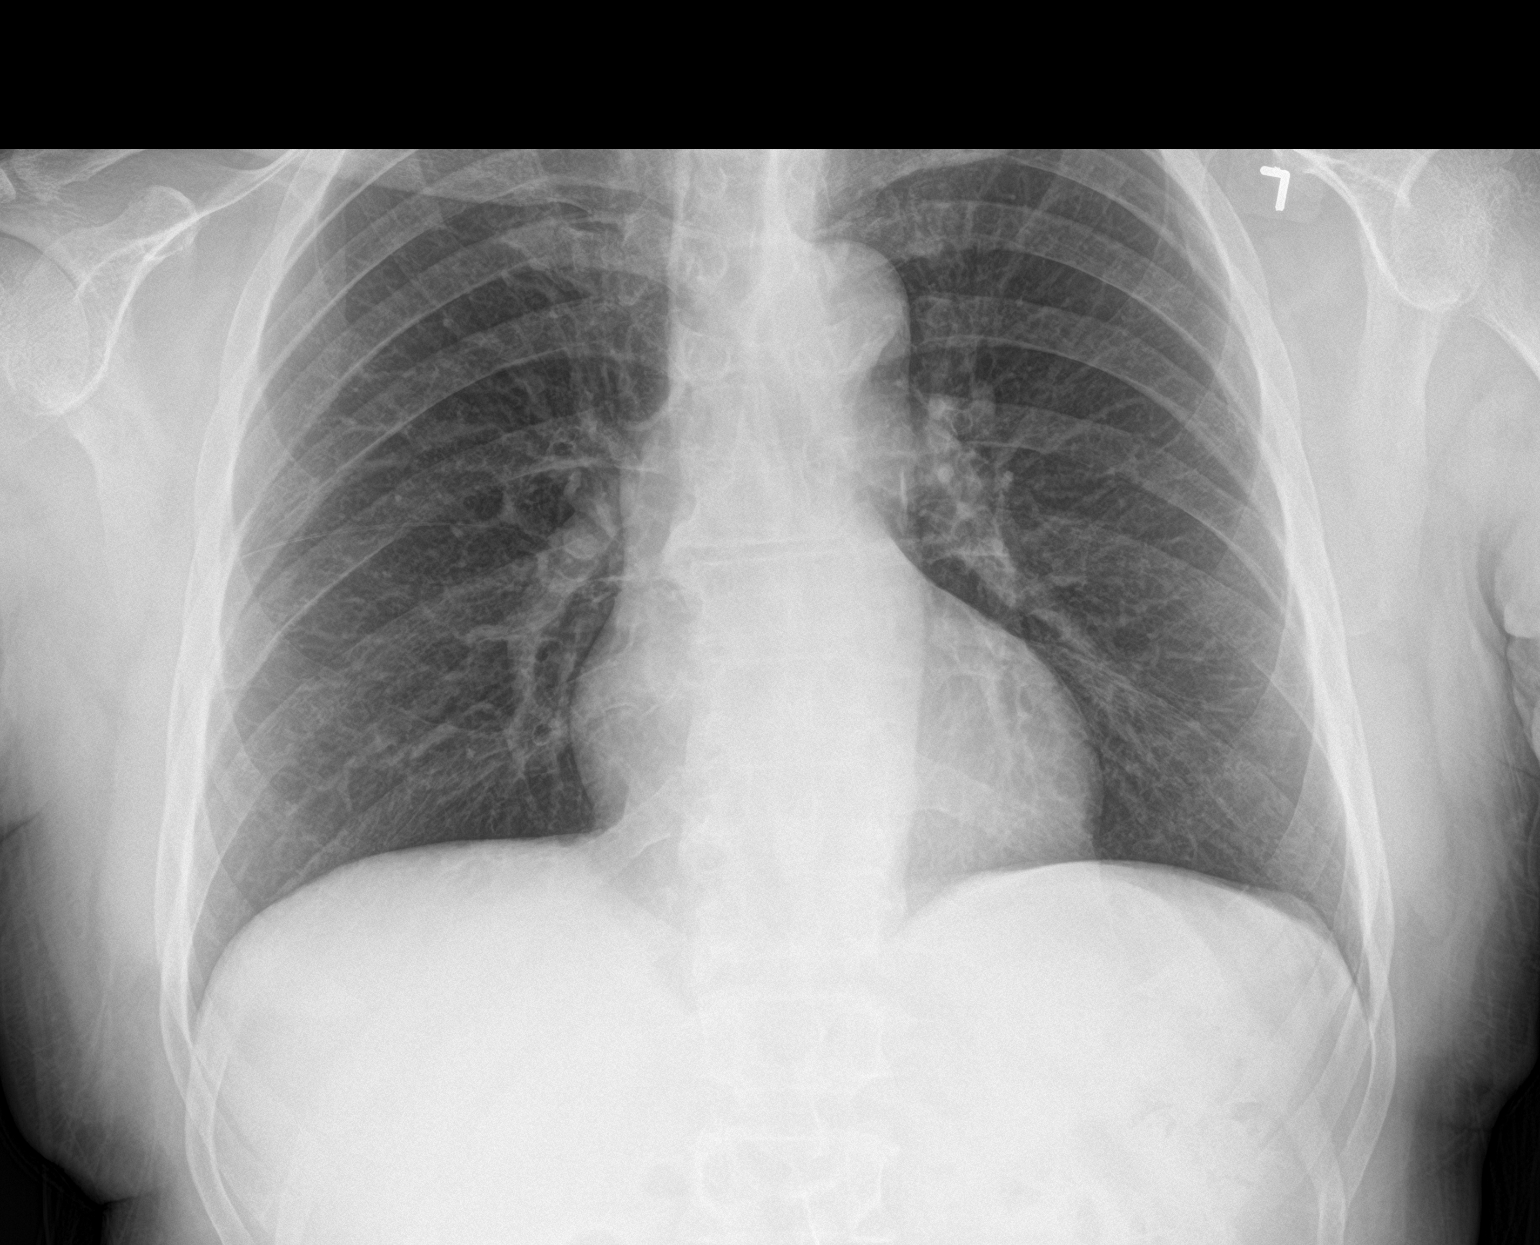

[chest lat]
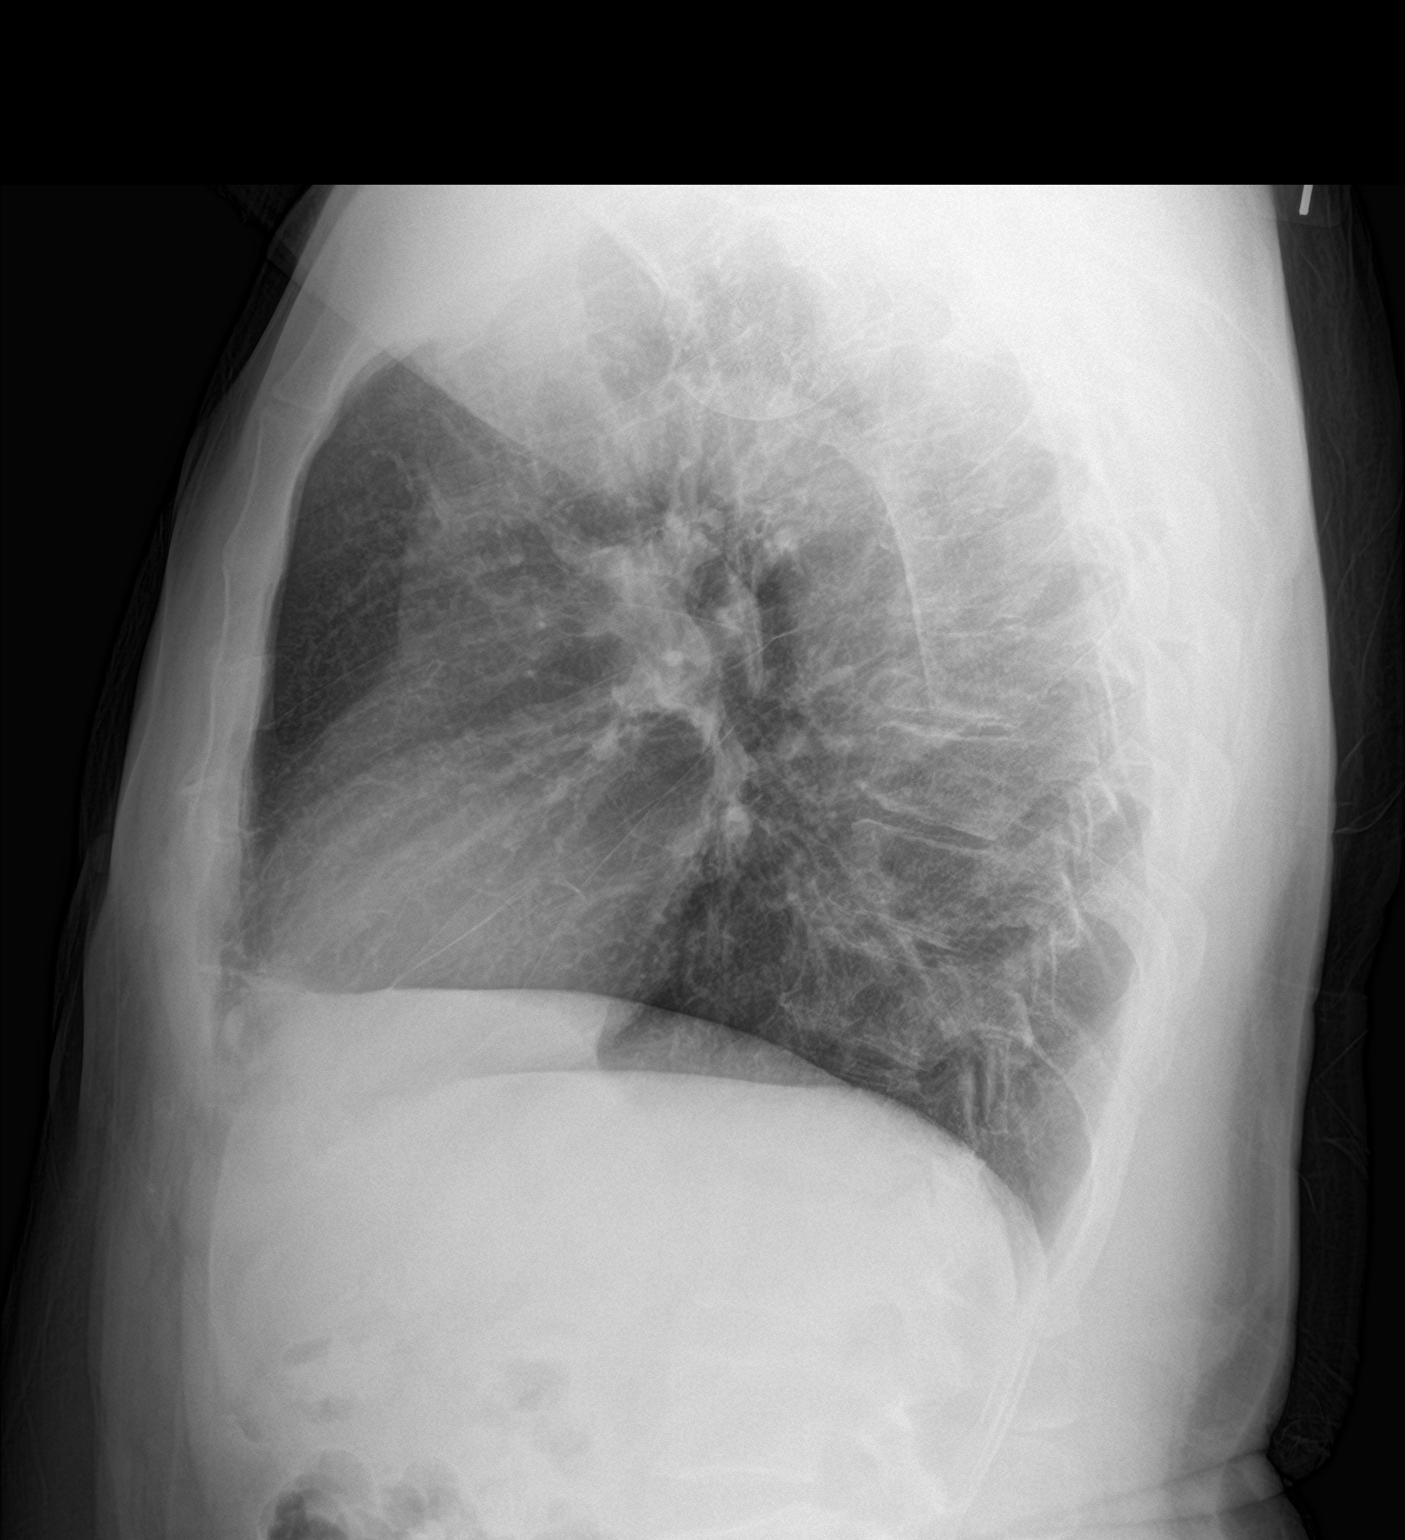

[2 of 2 positions shown; findings below may reference images not displayed]

FINDINGS: Normal cardiac and mediastinal contours. No consolidative pulmonary
opacities. No pleural effusion or pneumothorax. Regional skeleton is
unremarkable.
IMPRESSION: No active cardiopulmonary disease.

## 2019-04-30 ENCOUNTER — Encounter: Payer: Self-pay | Admitting: Family Medicine

## 2019-04-30 ENCOUNTER — Other Ambulatory Visit: Payer: Self-pay

## 2019-04-30 ENCOUNTER — Ambulatory Visit (INDEPENDENT_AMBULATORY_CARE_PROVIDER_SITE_OTHER): Payer: Medicare HMO | Admitting: Family Medicine

## 2019-04-30 VITALS — BP 138/88 | HR 84 | Temp 98.0°F | Ht 66.0 in | Wt 164.2 lb

## 2019-04-30 DIAGNOSIS — Z0000 Encounter for general adult medical examination without abnormal findings: Secondary | ICD-10-CM | POA: Diagnosis not present

## 2019-04-30 DIAGNOSIS — Z23 Encounter for immunization: Secondary | ICD-10-CM

## 2019-04-30 DIAGNOSIS — E785 Hyperlipidemia, unspecified: Secondary | ICD-10-CM | POA: Diagnosis not present

## 2019-04-30 MED ORDER — IBUPROFEN 800 MG PO TABS: 800.0000 mg | ORAL_TABLET | Freq: Four times a day (QID) | ORAL | 5 refills | Status: DC | PRN

## 2019-04-30 NOTE — Progress Notes (Signed)
   Subjective:    Patient ID: Darrell Baldwin, male    DOB: 08-Nov-1945, 73 y.o.   MRN: [id number]  HPI Here for a well exam. He feels fine in general. His BP is stable. He stopped taking Lipitor due to leg muscle pains, and he declines to take another statin.    Review of Systems  Constitutional: Negative.   HENT: Negative.   Eyes: Negative.   Respiratory: Negative.   Cardiovascular: Negative.   Gastrointestinal: Negative.   Genitourinary: Negative.   Musculoskeletal: Negative.   Skin: Negative.   Neurological: Negative.   Psychiatric/Behavioral: Negative.        Objective:   Physical Exam Constitutional:      General: He is not in acute distress.    Appearance: He is well-developed. He is not diaphoretic.  HENT:     Head: Normocephalic and atraumatic.     Right Ear: External ear normal.     Left Ear: External ear normal.     Nose: Nose normal.     Mouth/Throat:     Pharynx: No oropharyngeal exudate.  Eyes:     General: No scleral icterus.       Right eye: No discharge.        Left eye: No discharge.     Conjunctiva/sclera: Conjunctivae normal.     Pupils: Pupils are equal, round, and reactive to light.  Neck:     Musculoskeletal: Neck supple.     Thyroid: No thyromegaly.     Vascular: No JVD.     Trachea: No tracheal deviation.  Cardiovascular:     Rate and Rhythm: Normal rate and regular rhythm.     Heart sounds: Normal heart sounds. No murmur. No friction rub. No gallop.   Pulmonary:     Effort: Pulmonary effort is normal. No respiratory distress.     Breath sounds: Normal breath sounds. No wheezing or rales.  Chest:     Chest wall: No tenderness.  Abdominal:     General: Bowel sounds are normal. There is no distension.     Palpations: Abdomen is soft. There is no mass.     Tenderness: There is no abdominal tenderness. There is no guarding or rebound.  Genitourinary:    Penis: Normal. No tenderness.      Scrotum/Testes: Normal.     Prostate: Normal.     Rectum: Normal. Guaiac result negative.  Musculoskeletal: Normal range of motion.        General: No tenderness.  Lymphadenopathy:     Cervical: No cervical adenopathy.  Skin:    General: Skin is warm and dry.     Coloration: Skin is not pale.     Findings: No erythema or rash.  Neurological:     Mental Status: He is alert and oriented to person, place, and time.     Cranial Nerves: No cranial nerve deficit.     Motor: No abnormal muscle tone.     Coordination: Coordination normal.     Deep Tendon Reflexes: Reflexes are normal and symmetric. Reflexes normal.  Psychiatric:        Behavior: Behavior normal.        Thought Content: Thought content normal.        Judgment: Judgment normal.           Assessment & Plan:  Well exam. We discussed diet and exercise. He will set up fasting labs soon.  Alysia Penna, MD

## 2019-05-02 ENCOUNTER — Other Ambulatory Visit: Payer: Self-pay

## 2019-05-02 ENCOUNTER — Other Ambulatory Visit (INDEPENDENT_AMBULATORY_CARE_PROVIDER_SITE_OTHER): Payer: Medicare HMO

## 2019-05-02 DIAGNOSIS — Z0000 Encounter for general adult medical examination without abnormal findings: Secondary | ICD-10-CM

## 2019-05-02 LAB — CBC WITH DIFFERENTIAL/PLATELET
Basophils Absolute: 0 10*3/uL (ref 0.0–0.1)
Basophils Relative: 1.1 % (ref 0.0–3.0)
Eosinophils Absolute: 0.2 10*3/uL (ref 0.0–0.7)
Eosinophils Relative: 3.6 % (ref 0.0–5.0)
HCT: 39.4 % (ref 39.0–52.0)
Hemoglobin: 13.1 g/dL (ref 13.0–17.0)
Lymphocytes Relative: 36.1 % (ref 12.0–46.0)
Lymphs Abs: 1.6 10*3/uL (ref 0.7–4.0)
MCHC: 33.4 g/dL (ref 30.0–36.0)
MCV: 93.9 fl (ref 78.0–100.0)
Monocytes Absolute: 0.5 10*3/uL (ref 0.1–1.0)
Monocytes Relative: 10.4 % (ref 3.0–12.0)
Neutro Abs: 2.2 10*3/uL (ref 1.4–7.7)
Neutrophils Relative %: 48.8 % (ref 43.0–77.0)
Platelets: 225 10*3/uL (ref 150.0–400.0)
RBC: 4.2 Mil/uL — ABNORMAL LOW (ref 4.22–5.81)
RDW: 13.9 % (ref 11.5–15.5)
WBC: 4.5 10*3/uL (ref 4.0–10.5)

## 2019-05-02 LAB — LIPID PANEL
Cholesterol: 237 mg/dL — ABNORMAL HIGH (ref 0–200)
HDL: 43.8 mg/dL (ref >39.00)
LDL Cholesterol: 170 mg/dL — ABNORMAL HIGH (ref 0–99)
NonHDL: 193.2
Total CHOL/HDL Ratio: 5
Triglycerides: 115 mg/dL (ref 0.0–149.0)
VLDL: 23 mg/dL (ref 0.0–40.0)

## 2019-05-02 LAB — POC URINALSYSI DIPSTICK (AUTOMATED)
Bilirubin, UA: NEGATIVE
Blood, UA: NEGATIVE
Glucose, UA: NEGATIVE
Ketones, UA: NEGATIVE
Leukocytes, UA: NEGATIVE
Nitrite, UA: NEGATIVE
Protein, UA: NEGATIVE
Spec Grav, UA: 1.025 (ref 1.010–1.025)
Urobilinogen, UA: 0.2 E.U./dL
pH, UA: 6 (ref 5.0–8.0)

## 2019-05-02 LAB — BASIC METABOLIC PANEL
BUN: 13 mg/dL (ref 6–23)
CO2: 29 mEq/L (ref 19–32)
Calcium: 9 mg/dL (ref 8.4–10.5)
Chloride: 106 mEq/L (ref 96–112)
Creatinine, Ser: 1.24 mg/dL (ref 0.40–1.50)
GFR: 69.05 mL/min (ref >60.00)
Glucose, Bld: 99 mg/dL (ref 70–99)
Potassium: 4.3 mEq/L (ref 3.5–5.1)
Sodium: 142 mEq/L (ref 135–145)

## 2019-05-02 LAB — HEPATIC FUNCTION PANEL
ALT: 9 U/L (ref 0–53)
AST: 12 U/L (ref 0–37)
Albumin: 3.9 g/dL (ref 3.5–5.2)
Alkaline Phosphatase: 108 U/L (ref 39–117)
Bilirubin, Direct: 0.1 mg/dL (ref 0.0–0.3)
Total Bilirubin: 0.5 mg/dL (ref 0.2–1.2)
Total Protein: 6.4 g/dL (ref 6.0–8.3)

## 2019-05-02 LAB — PSA: PSA: 2.14 ng/mL (ref 0.10–4.00)

## 2019-05-02 LAB — TSH: TSH: 0.94 u[IU]/mL (ref 0.35–4.50)

## 2019-05-03 NOTE — Addendum Note (Signed)
Addended by: Alysia Penna A on: 05/03/2019 08:41 AM   Modules accepted: Orders

## 2019-05-07 ENCOUNTER — Other Ambulatory Visit: Payer: Self-pay

## 2019-06-07 ENCOUNTER — Ambulatory Visit: Payer: Medicare HMO | Admitting: Internal Medicine

## 2019-07-10 ENCOUNTER — Ambulatory Visit (INDEPENDENT_AMBULATORY_CARE_PROVIDER_SITE_OTHER): Payer: Medicare HMO | Admitting: Internal Medicine

## 2019-07-10 ENCOUNTER — Other Ambulatory Visit: Payer: Self-pay

## 2019-07-10 ENCOUNTER — Encounter: Payer: Self-pay | Admitting: Internal Medicine

## 2019-07-10 VITALS — BP 169/99 | HR 62 | Temp 97.2°F | Resp 16 | Ht 67.0 in | Wt 167.2 lb

## 2019-07-10 DIAGNOSIS — I10 Essential (primary) hypertension: Secondary | ICD-10-CM

## 2019-07-10 DIAGNOSIS — E785 Hyperlipidemia, unspecified: Secondary | ICD-10-CM | POA: Diagnosis not present

## 2019-07-10 MED ORDER — ROSUVASTATIN CALCIUM 20 MG PO TABS: 20.0000 mg | ORAL_TABLET | Freq: Every day | ORAL | 3 refills | Status: DC

## 2019-07-10 NOTE — Patient Instructions (Addendum)
Medication Instructions:  START crestor 20mg  daily *If you need a refill on your cardiac medications before your next appointment, please call your pharmacy*  Lab Work: FASTING lab work in 3 months to check cholesterol  If you have labs (blood work) drawn today and your tests are completely normal, you will receive your results only by: Marland Kitchen MyChart Message (if you have MyChart) OR . A paper copy in the mail If you have any lab test that is abnormal or we need to change your treatment, we will call you to review the results.  Testing/Procedures: NONE  Follow-Up: At [facility], you and your health needs are our priority.  As part of our continuing mission to provide you with exceptional heart care, we have created designated Provider Care Teams.  These Care Teams include your primary Cardiologist (physician) and Advanced Practice Providers (APPs -  Physician Assistants and Nurse Practitioners) who all work together to provide you with the care you need, when you need it.  Your next appointment:   3 months - lipid clinic  The format for your next appointment:   Either In Person or Virtual  Provider:   Raliegh Ip Mali Hilty, MD  Other Instructions

## 2019-07-10 NOTE — Progress Notes (Signed)
LIPID CLINIC CONSULT NOTE  Chief Complaint:  Manage dyslipidemia  Primary Care Physician: Laurey Morale, MD  Primary Cardiologist:  No primary care provider on file.  HPI:  Darrell Baldwin is a 73 y.o. male who is being seen today for the evaluation of dyslipidemia at the request of Laurey Morale, MD.  This is a pleasant 73 year old male kindly referred for evaluation and management of dyslipidemia.  He has a past history of hypertension which has been recently treated.  Prior to that he said he had not seen a doctor in 40 years.  He had recently retired.  He previously has been very active.  He says he continues to exercise and is asymptomatic, denying any chest pain or worsening shortness of breath.  He says that he has a second degree black belt in is a lot of other things.  Recently was placed on statin therapy with atorvastatin due to concerns about elevated cholesterol.  His LDL has been between 170 and 190.  There is heart disease in his family.  He said his father had a defibrillator and died at age 10.  After taking a statin for a while, he developed some cramping in his calves.  He thought this might have also been related to drinking lemonade.  He subsequently stopped both but I suspect it was related to the statin.  For the most part though symptoms have improved.  PMHx:  Past Medical History:  Diagnosis Date  . Allergic rhinitis   . Hypertension     Past Surgical History:  Procedure Laterality Date  . COLONOSCOPY  04/11/2014   per Dr. Olevia Perches, diverticulosis and internal hemorrhoids, no polyps, repeat in 10 yrs     FAMHx:  Family History  Problem Relation Age of Onset  . Breast cancer Sister   . Diabetes Sister   . Lung cancer Brother   . Liver cancer Sister   . Diabetes Sister   . Colon cancer Neg Hx     SOCHx:   reports that he has never smoked. He has never used smokeless tobacco. He reports that he does not drink alcohol or use drugs.  ALLERGIES:    No Known Allergies  ROS: Pertinent items noted in HPI and remainder of comprehensive ROS otherwise negative.  HOME MEDS: Current Outpatient Medications on File Prior to Visit  Medication Sig Dispense Refill  . amLODipine (NORVASC) 10 MG tablet Take 1 tablet (10 mg total) by mouth daily. 90 tablet 3  . amoxicillin-clavulanate (AUGMENTIN) 875-125 MG tablet Take 1 tablet by mouth 2 (two) times daily. (Patient not taking: Reported on 04/30/2019) 20 tablet 0  . chlorpheniramine-HYDROcodone (TUSSIONEX PENNKINETIC ER) 10-8 MG/5ML SUER Take 5 mLs by mouth every 12 (twelve) hours as needed for cough. (Patient not taking: Reported on 04/30/2019) 115 mL 0  . dextromethorphan (DELSYM) 30 MG/5ML liquid Take by mouth as needed for cough.    . fluticasone (FLONASE) 50 MCG/ACT nasal spray Place 2 sprays into both nostrils daily. 16 g 11  . ibuprofen (ADVIL) 800 MG tablet Take 1 tablet (800 mg total) by mouth every 6 (six) hours as needed for mild pain. 120 tablet 5  . loratadine (CLARITIN) 10 MG tablet Take 10 mg by mouth 2 (two) times daily.    . sildenafil (VIAGRA) 100 MG tablet Take 1 tablet (100 mg total) by mouth as needed for erectile dysfunction. (Patient not taking: Reported on 04/30/2019) 10 tablet 11   No current facility-administered medications on  file prior to visit.    LABS/IMAGING: No results found for this or any previous visit (from the past 48 hour(s)). No results found.  LIPID PANEL:    Component Value Date/Time   CHOL 237 (H) 05/02/2019 0730   TRIG 115.0 05/02/2019 0730   HDL 43.80 05/02/2019 0730   CHOLHDL 5 05/02/2019 0730   VLDL 23.0 05/02/2019 0730   LDLCALC 170 (H) 05/02/2019 0730   LDLDIRECT 187.2 02/27/2013 1159    WEIGHTS: Wt Readings from Last 3 Encounters:  07/10/19 167 lb 3.2 oz (75.8 kg)  04/30/19 164 lb 3.2 oz (74.5 kg)  09/13/18 170 lb (77.1 kg)    VITALS: BP (!) 169/99   Pulse 62   Temp (!) 97.2 F (36.2 C)   Resp 16   Ht 5\' 7"  (1.702 m)   Wt 167 lb  3.2 oz (75.8 kg)   SpO2 95%   BMI 26.19 kg/m   EXAM: General appearance: alert and no distress Neck: no carotid bruit, no JVD and thyroid not enlarged, symmetric, no tenderness/mass/nodules Lungs: clear to auscultation bilaterally Heart: regular rate and rhythm Abdomen: soft, non-tender; bowel sounds normal; no masses,  no organomegaly Extremities: extremities normal, atraumatic, no cyanosis or edema Pulses: 2+ and symmetric Skin: Skin color, texture, turgor normal. No rashes or lesions Neurologic: Grossly normal Psych: Pleasant  EKG: Deferred  ASSESSMENT: 1. 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 Intermediate 10-year Framingham risk 3. Family history of coronary disease in his father 22. Essential hypertension  PLAN: 1.   Mr. 10 has a mixed dyslipidemia and I would submit with no known coronary disease and risk factors including hypertension and family history as well as age, his target LDL should be less than 100.  He will need 40 to 50% reduction to reach that target which would be accomplished by high potency statin.  Previously had tried atorvastatin and had likely cramping associated with that.  This is generally resolved.  I would recommend trial of an alternative statin, namely rosuvastatin 20 mg daily.  Hopefully he will tolerate this better.  We will plan a repeat lipid in about 3 months and follow-up afterwards.  Should he be intolerant of this, we can discuss other options.  Thanks again for the kind referral.  Gailen Shelter, MD, [facility]  [place]  [facility]  Medical Director of the Advanced Lipid Disorders &  Cardiovascular Risk Reduction Clinic Diplomate of the American Board of Clinical Lipidology Attending Cardiologist  Direct Dial: [phone]  Fax: [phone]  Website:  [email or web address]  [phone] Sela Falk 07/10/2019, 1:25 PM

## 2019-07-16 ENCOUNTER — Other Ambulatory Visit: Payer: Self-pay

## 2019-07-17 ENCOUNTER — Ambulatory Visit: Payer: Medicare HMO | Admitting: Family Medicine

## 2019-07-23 ENCOUNTER — Ambulatory Visit: Payer: Medicare HMO | Admitting: Family Medicine

## 2019-08-13 ENCOUNTER — Ambulatory Visit (INDEPENDENT_AMBULATORY_CARE_PROVIDER_SITE_OTHER): Payer: Medicare Other | Admitting: Family Medicine

## 2019-08-13 ENCOUNTER — Other Ambulatory Visit: Payer: Self-pay

## 2019-08-13 ENCOUNTER — Encounter: Payer: Self-pay | Admitting: Family Medicine

## 2019-08-13 VITALS — BP 140/70 | HR 96 | Temp 97.6°F | Wt 164.8 lb

## 2019-08-13 DIAGNOSIS — I10 Essential (primary) hypertension: Secondary | ICD-10-CM | POA: Diagnosis not present

## 2019-08-13 DIAGNOSIS — Z789 Other specified health status: Secondary | ICD-10-CM | POA: Diagnosis not present

## 2019-08-13 DIAGNOSIS — E785 Hyperlipidemia, unspecified: Secondary | ICD-10-CM

## 2019-08-13 MED ORDER — EZETIMIBE 10 MG PO TABS: 10.0000 mg | ORAL_TABLET | Freq: Every day | ORAL | 3 refills | Status: DC

## 2019-08-13 NOTE — Progress Notes (Signed)
   Subjective:    Patient ID: Darrell Baldwin, male    DOB: [birth year], 74 y.o.   MRN: [id number]  HPI Here for advice after trying several statins. He has had leg cramps in reaction to taking Zocor and Lipitor in the past. Then he met with Dr. Debara Pickett on 07-10-19, and he wanted Darrell Baldwin to try Crestor. He immediately developed cramps in his legs and had to stop taking this after only 3 days. He has been trying to watch his diet as best he can.    Review of Systems  Constitutional: Negative.   Respiratory: Negative.   Cardiovascular: Negative.   Musculoskeletal: Positive for myalgias.  Neurological: Negative.        Objective:   Physical Exam Constitutional:      Appearance: Normal appearance.  Cardiovascular:     Rate and Rhythm: Normal rate and regular rhythm.     Pulses: Normal pulses.     Heart sounds: Normal heart sounds.  Pulmonary:     Effort: Pulmonary effort is normal.     Breath sounds: Normal breath sounds.  Neurological:     General: No focal deficit present.     Mental Status: He is alert and oriented to person, place, and time.           Assessment & Plan:  Dyslipidemia in the face of intolerance to statins. He will start on Zetia 10 mg daily. Recheck lipids in 90 days. Darrell Penna, MD

## 2019-10-11 ENCOUNTER — Encounter: Payer: Self-pay | Admitting: Internal Medicine

## 2019-10-11 ENCOUNTER — Other Ambulatory Visit: Payer: Self-pay

## 2019-10-11 ENCOUNTER — Ambulatory Visit (INDEPENDENT_AMBULATORY_CARE_PROVIDER_SITE_OTHER): Payer: Self-pay | Admitting: Internal Medicine

## 2019-10-11 VITALS — BP 142/90 | HR 61 | Temp 96.8°F | Ht 67.0 in | Wt 165.6 lb

## 2019-10-11 DIAGNOSIS — E785 Hyperlipidemia, unspecified: Secondary | ICD-10-CM

## 2019-10-11 MED ORDER — EZETIMIBE 10 MG PO TABS: 10.0000 mg | ORAL_TABLET | Freq: Every day | ORAL | 3 refills | Status: DC

## 2019-10-11 NOTE — Progress Notes (Signed)
LIPID CLINIC CONSULT NOTE  Chief Complaint:  Manage dyslipidemia  Primary Care Physician: Darrell Morale, MD  Primary Cardiologist:  No primary care provider on file.  HPI:  Darrell Baldwin is a 74 y.o. male who is being seen today for the evaluation of dyslipidemia at the request of Darrell Morale, MD.  This is a pleasant 74 year old male kindly referred for evaluation and management of dyslipidemia.  He has a past history of hypertension which has been recently treated.  Prior to that he said he had not seen a doctor in 40 years.  He had recently retired.  He previously has been very active.  He says he continues to exercise and is asymptomatic, denying any chest pain or worsening shortness of breath.  He says that he has a second degree black belt in is a lot of other things.  Recently was placed on statin therapy with atorvastatin due to concerns about elevated cholesterol.  His LDL has been between 170 and 190.  There is heart disease in his family.  He said his father had a defibrillator and died at age 1.  After taking a statin for a while, he developed some cramping in his calves.  He thought this might have also been related to drinking lemonade.  He subsequently stopped both but I suspect it was related to the statin.  For the most part though symptoms have improved.  10/11/2019  [name] returns today for follow-up.  He reported he could not tolerate Crestor.  He saw his PCP in January who discontinued it and started Zetia 10 mg daily.  Plan to repeat lipids in 3 months.  [name] reports he never got the medication was not clear that he needed to take it.  Today he comes in off of any medications for cholesterol management and has not had any repeat lipids.  It would be of little utility to repeat the lipids today since he is not on medication.  PMHx:  Past Medical History:  Diagnosis Date  . Allergic rhinitis   . Hypertension     Past Surgical History:  Procedure  Laterality Date  . COLONOSCOPY  04/11/2014   per Dr. Olevia Baldwin, diverticulosis and internal hemorrhoids, no polyps, repeat in 10 yrs     FAMHx:  Family History  Problem Relation Age of Onset  . Breast cancer Sister   . Diabetes Sister   . Lung cancer Brother   . Liver cancer Sister   . Diabetes Sister   . Colon cancer Neg Hx     SOCHx:   reports that he has never smoked. He has never used smokeless tobacco. He reports that he does not drink alcohol or use drugs.  ALLERGIES:  Allergies  Allergen Reactions  . Statins     Muscle cramps    ROS: Pertinent items noted in HPI and remainder of comprehensive ROS otherwise negative.  HOME MEDS: Current Outpatient Medications on File Prior to Visit  Medication Sig Dispense Refill  . amLODipine (NORVASC) 10 MG tablet Take 1 tablet (10 mg total) by mouth daily. 90 tablet 3  . chlorpheniramine-HYDROcodone (TUSSIONEX PENNKINETIC ER) 10-8 MG/5ML SUER Take 5 mLs by mouth every 12 (twelve) hours as needed for cough. 115 mL 0  . dextromethorphan (DELSYM) 30 MG/5ML liquid Take by mouth as needed for cough.    . ezetimibe (ZETIA) 10 MG tablet Take 1 tablet (10 mg total) by mouth daily. 30 tablet 3  . fluticasone (FLONASE)  50 MCG/ACT nasal spray Place 2 sprays into both nostrils daily. 16 g 11  . ibuprofen (ADVIL) 800 MG tablet Take 1 tablet (800 mg total) by mouth every 6 (six) hours as needed for mild pain. 120 tablet 5  . loratadine (CLARITIN) 10 MG tablet Take 10 mg by mouth 2 (two) times daily.    . sildenafil (VIAGRA) 100 MG tablet Take 1 tablet (100 mg total) by mouth as needed for erectile dysfunction. 10 tablet 11   No current facility-administered medications on file prior to visit.    LABS/IMAGING: No results found for this or any previous visit (from the past 48 hour(s)). No results found.  LIPID PANEL:    Component Value Date/Time   CHOL 237 (H) 05/02/2019 0730   TRIG 115.0 05/02/2019 0730   HDL 43.80 05/02/2019 0730    CHOLHDL 5 05/02/2019 0730   VLDL 23.0 05/02/2019 0730   LDLCALC 170 (H) 05/02/2019 0730   LDLDIRECT 187.2 02/27/2013 1159    WEIGHTS: Wt Readings from Last 3 Encounters:  10/11/19 165 lb 9.6 oz (75.1 kg)  08/13/19 164 lb 12.8 oz (74.8 kg)  07/10/19 167 lb 3.2 oz (75.8 kg)    VITALS: BP (!) 142/90   Pulse 61   Temp (!) 96.8 F (36 C)   Ht 5\' 7"  (1.702 m)   Wt 165 lb 9.6 oz (75.1 kg)   SpO2 97%   BMI 25.94 kg/m   EXAM: Deferred  EKG: Deferred  ASSESSMENT: 1. 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 Intermediate 10-year Framingham risk 3. Family history of coronary disease in his father 1. Essential hypertension  5. Statin myalgias  PLAN: 1.   [name] was not tolerant of rosuvastatin and previously did not tolerate atorvastatin due to myalgia and cramping.  He was switched to ezetimibe but never got the medication filled and was not aware that he is to take it.  Will reorder it today and plan a repeat lipid profile in about 3 months with follow-up at that time.  Darrell Haven, MD, [facility], FACP  [place]  [facility]  Medical Director of the Advanced Lipid Disorders &  Cardiovascular Risk Reduction Clinic Diplomate of the American Board of Clinical Lipidology Attending Cardiologist  Direct Dial: [phone]  Fax: [phone]  Website:  [email or web address] Darrell Baldwin 10/11/2019, 8:52 AM

## 2019-10-11 NOTE — Patient Instructions (Signed)
Medication Instructions:  START zetia 10mg  daily  *If you need a refill on your cardiac medications before your next appointment, please call your pharmacy*   Lab Work: FASTING lab work in about 3 months to check cholesterol  This can be done at Dr. office (Mon-Fri from 8-4:30pm - no appointment needed)   If you have labs (blood work) drawn today and your tests are completely normal, you will receive your results only by: Blanchie Dessert MyChart Message (if you have MyChart) OR . A paper copy in the mail If you have any lab test that is abnormal or we need to change your treatment, we will call you to review the results.   Testing/Procedures: NONE   Follow-Up: At [facility], you and your health needs are our priority.  As part of our continuing mission to provide you with exceptional heart care, we have created designated Provider Care Teams.  These Care Teams include your primary Cardiologist (physician) and Advanced Practice Providers (APPs -  Physician Assistants and Nurse Practitioners) who all work together to provide you with the care you need, when you need it.  We recommend signing up for the patient portal called "MyChart".  Sign up information is provided on this After Visit Summary.  MyChart is used to connect with patients for Virtual Visits (Telemedicine).  Patients are able to view lab/test results, encounter notes, upcoming appointments, etc.  Non-urgent messages can be sent to your provider as well.   To learn more about what you can do with MyChart, go to [facility].    Your next appointment:   3-4 month(s)  The format for your next appointment:   Either In Person or Virtual  Provider:   [email or web address] Kirtland Bouchard Hilty, MD   Other Instructions

## 2019-12-31 DIAGNOSIS — E785 Hyperlipidemia, unspecified: Secondary | ICD-10-CM | POA: Diagnosis not present

## 2019-12-31 LAB — LIPID PANEL
Chol/HDL Ratio: 4.6 ratio (ref 0.0–5.0)
Cholesterol, Total: 232 mg/dL — ABNORMAL HIGH (ref 100–199)
HDL: 50 mg/dL (ref >39)
LDL Chol Calc (NIH): 166 mg/dL — ABNORMAL HIGH (ref 0–99)
Triglycerides: 91 mg/dL (ref 0–149)
VLDL Cholesterol Cal: 16 mg/dL (ref 5–40)

## 2020-01-02 ENCOUNTER — Ambulatory Visit (INDEPENDENT_AMBULATORY_CARE_PROVIDER_SITE_OTHER): Payer: Self-pay | Admitting: Internal Medicine

## 2020-01-02 ENCOUNTER — Encounter: Payer: Self-pay | Admitting: Internal Medicine

## 2020-01-02 ENCOUNTER — Other Ambulatory Visit: Payer: Self-pay

## 2020-01-02 VITALS — BP 182/100 | HR 72 | Ht 67.0 in | Wt 167.0 lb

## 2020-01-02 DIAGNOSIS — I10 Essential (primary) hypertension: Secondary | ICD-10-CM

## 2020-01-02 DIAGNOSIS — Z9114 Patient's other noncompliance with medication regimen: Secondary | ICD-10-CM

## 2020-01-02 DIAGNOSIS — E785 Hyperlipidemia, unspecified: Secondary | ICD-10-CM

## 2020-01-02 NOTE — Patient Instructions (Signed)

## 2020-01-02 NOTE — Progress Notes (Signed)
LIPID CLINIC CONSULT NOTE  Chief Complaint:  Follow-up dyslipidemia  Primary Care Physician: Laurey Morale, MD  Primary Cardiologist:  No primary care provider on file.  HPI:  Darrell Baldwin is a 74 y.o. male who is being seen today for the evaluation of dyslipidemia at the request of Laurey Morale, MD.  This is a pleasant 74 year old male kindly referred for evaluation and management of dyslipidemia.  He has a past history of hypertension which has been recently treated.  Prior to that he said he had not seen a doctor in 40 years.  He had recently retired.  He previously has been very active.  He says he continues to exercise and is asymptomatic, denying any chest pain or worsening shortness of breath.  He says that he has a second degree black belt in is a lot of other things.  Recently was placed on statin therapy with atorvastatin due to concerns about elevated cholesterol.  His LDL has been between 170 and 190.  There is heart disease in his family.  He said his father had a defibrillator and died at age 89.  After taking a statin for a while, he developed some cramping in his calves.  He thought this might have also been related to drinking lemonade.  He subsequently stopped both but I suspect it was related to the statin.  For the most part though symptoms have improved.  10/11/2019  Mr. Muench returns today for follow-up.  He reported he could not tolerate Crestor.  He saw his PCP in January who discontinued it and started Zetia 10 mg daily.  Plan to repeat lipids in 3 months.  Mr. Vladimir Creeks reports he never got the medication was not clear that he needed to take it.  Today he comes in off of any medications for cholesterol management and has not had any repeat lipids.  It would be of little utility to repeat the lipids today since he is not on medication.  01/02/2020  Mr. Woolverton returns today for follow-up.  When I previously saw him he had been prescribed ezetimibe but was not  taking it.  I advised him to start taking it and repeat lipids in 3 months.  He had those labs drawn 2 days ago which shows persistent elevated LDL cholesterol 166, previously 170 suggestive that he may not be taking the medication.  He says he is taking the medication but cannot describe it or say that he takes it regularly.  He is accompanied by significant other today who seem to suggest that he may not be taking the medication.  In addition his blood pressure was significantly elevated today 182/100.  He is supposed to be on amlodipine 10 mg daily but cannot convincingly tell me that he took that as well.  I am very concerned about medication noncompliance.  PMHx:  Past Medical History:  Diagnosis Date  . Allergic rhinitis   . Hypertension     Past Surgical History:  Procedure Laterality Date  . COLONOSCOPY  04/11/2014   per Dr. Olevia Perches, diverticulosis and internal hemorrhoids, no polyps, repeat in 10 yrs     FAMHx:  Family History  Problem Relation Age of Onset  . Breast cancer Sister   . Diabetes Sister   . Lung cancer Brother   . Liver cancer Sister   . Diabetes Sister   . Colon cancer Neg Hx     SOCHx:   reports that he has never smoked. He has  never used smokeless tobacco. He reports that he does not drink alcohol and does not use drugs.  ALLERGIES:  Allergies  Allergen Reactions  . Statins     Muscle cramps    ROS: Pertinent items noted in HPI and remainder of comprehensive ROS otherwise negative.  HOME MEDS: Current Outpatient Medications on File Prior to Visit  Medication Sig Dispense Refill  . amLODipine (NORVASC) 10 MG tablet Take 1 tablet (10 mg total) by mouth daily. 90 tablet 3  . chlorpheniramine-HYDROcodone (TUSSIONEX PENNKINETIC ER) 10-8 MG/5ML SUER Take 5 mLs by mouth every 12 (twelve) hours as needed for cough. 115 mL 0  . dextromethorphan (DELSYM) 30 MG/5ML liquid Take by mouth as needed for cough.    . ezetimibe (ZETIA) 10 MG tablet Take 1 tablet  (10 mg total) by mouth daily. 90 tablet 3  . fluticasone (FLONASE) 50 MCG/ACT nasal spray Place 2 sprays into both nostrils daily. 16 g 11  . ibuprofen (ADVIL) 800 MG tablet Take 1 tablet (800 mg total) by mouth every 6 (six) hours as needed for mild pain. 120 tablet 5  . loratadine (CLARITIN) 10 MG tablet Take 10 mg by mouth 2 (two) times daily.    . sildenafil (VIAGRA) 100 MG tablet Take 1 tablet (100 mg total) by mouth as needed for erectile dysfunction. 10 tablet 11   No current facility-administered medications on file prior to visit.    LABS/IMAGING: No results found for this or any previous visit (from the past 48 hour(s)). No results found.  LIPID PANEL:    Component Value Date/Time   CHOL 232 (H) 12/31/2019 0819   TRIG 91 12/31/2019 0819   HDL 50 12/31/2019 0819   CHOLHDL 4.6 12/31/2019 0819   CHOLHDL 5 05/02/2019 0730   VLDL 23.0 05/02/2019 0730   LDLCALC 166 (H) 12/31/2019 0819   LDLDIRECT 187.2 02/27/2013 1159    WEIGHTS: Wt Readings from Last 3 Encounters:  01/02/20 167 lb (75.8 kg)  10/11/19 165 lb 9.6 oz (75.1 kg)  08/13/19 164 lb 12.8 oz (74.8 kg)    VITALS: BP (!) 182/100   Pulse 72   Ht 5\' 7"  (1.702 m)   Wt 167 lb (75.8 kg)   SpO2 97%   BMI 26.16 kg/m   EXAM: Deferred  EKG: Deferred  ASSESSMENT: 1. 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 Intermediate 10-year Framingham risk 3. Family history of coronary disease in his father 28. Uncontrolled hypertension  5. Statin myalgias 6. Concern for medication noncompliance  PLAN: 1.   Mr. Patras is likely noncompliant with medications.  His LDL cholesterol is not significantly lower as expected with ezetimibe.  Blood pressure is also poorly controlled today.  I have encouraged him to follow-up with his PCP and would recommend that the PCP reach out to him to help with medication adherence.  If he is still not treated to target and is compliant with therapy I am happy to see him back, otherwise  would recommend follow-up with me as needed due to issues with compliance.  Leone Haven, MD, [facility], FACP  [place]  [facility]  Medical Director of the [facility]id [facility] &  Cardiovascular Risk Reduction Clinic Diplomate of the American Board of Clinical Lipidology Attending Cardiologist  Direct Dial: [phone]  Fax: [phone]  Website:  [email or web address] Herley Bernardini 01/02/2020, 10:00 AM

## 2020-01-03 ENCOUNTER — Other Ambulatory Visit: Payer: Self-pay

## 2020-01-06 ENCOUNTER — Encounter: Payer: Self-pay | Admitting: Family Medicine

## 2020-01-06 ENCOUNTER — Ambulatory Visit (INDEPENDENT_AMBULATORY_CARE_PROVIDER_SITE_OTHER): Payer: Self-pay | Admitting: Family Medicine

## 2020-01-06 ENCOUNTER — Other Ambulatory Visit: Payer: Self-pay

## 2020-01-06 VITALS — BP 140/70 | HR 65 | Temp 97.2°F | Wt 168.2 lb

## 2020-01-06 DIAGNOSIS — I10 Essential (primary) hypertension: Secondary | ICD-10-CM

## 2020-01-06 MED ORDER — AMLODIPINE BESYLATE 10 MG PO TABS: 10.0000 mg | ORAL_TABLET | Freq: Every day | ORAL | 3 refills | Status: DC

## 2020-01-06 NOTE — Progress Notes (Signed)
   Subjective:    Patient ID: Darrell Baldwin, male    DOB: Nov 17, 1945, 74 y.o.   MRN: [id number]  HPI Here to follow up on HTN. He feels well. His BP had been high, but he admits to not taking the medication every day. For the past few weeks he has been taking it regularly, and the BP has been stable.    Review of Systems  Constitutional: Negative.   Respiratory: Negative.   Cardiovascular: Negative.        Objective:   Physical Exam Constitutional:      Appearance: Normal appearance.  Cardiovascular:     Rate and Rhythm: Normal rate and regular rhythm.     Pulses: Normal pulses.     Heart sounds: Normal heart sounds.  Pulmonary:     Effort: Pulmonary effort is normal.     Breath sounds: Normal breath sounds.  Neurological:     Mental Status: He is alert.           Assessment & Plan:  HTN, stable. I reminded him to take all medications as rpescribed.  [name], MD

## 2020-03-25 ENCOUNTER — Ambulatory Visit: Payer: Self-pay | Admitting: Family Medicine

## 2020-03-26 ENCOUNTER — Ambulatory Visit (INDEPENDENT_AMBULATORY_CARE_PROVIDER_SITE_OTHER): Payer: Medicare HMO | Admitting: Family Medicine

## 2020-03-26 ENCOUNTER — Encounter: Payer: Self-pay | Admitting: Family Medicine

## 2020-03-26 ENCOUNTER — Other Ambulatory Visit: Payer: Self-pay

## 2020-03-26 VITALS — BP 140/70 | HR 42 | Temp 98.3°F | Wt 171.0 lb

## 2020-03-26 DIAGNOSIS — S86812A Strain of other muscle(s) and tendon(s) at lower leg level, left leg, initial encounter: Secondary | ICD-10-CM | POA: Diagnosis not present

## 2020-03-26 DIAGNOSIS — B351 Tinea unguium: Secondary | ICD-10-CM

## 2020-03-26 MED ORDER — TERBINAFINE HCL 250 MG PO TABS: 250.0000 mg | ORAL_TABLET | Freq: Every day | ORAL | 1 refills | Status: AC

## 2020-03-26 NOTE — Progress Notes (Signed)
   Subjective:    Patient ID: Darrell Baldwin, male    DOB: 19-May-1946, 74 y.o.   MRN: [id number]  HPI Here for several years of darkening of his toenails. They are not painful. Also 2 weeks ago as he stepped out of his truck onto the ground he felt a sudden sharp pain in the left calf. It swelled at first but then the swelling went down. It feels better now but it is still tender.    Review of Systems  Constitutional: Negative.   Respiratory: Negative.   Cardiovascular: Negative.   Musculoskeletal: Positive for myalgias.       Objective:   Physical Exam Cardiovascular:     Rate and Rhythm: Normal rate and regular rhythm.     Pulses: Normal pulses.     Heart sounds: Normal heart sounds.  Pulmonary:     Effort: Pulmonary effort is normal.     Breath sounds: Normal breath sounds.  Musculoskeletal:     Comments: The left calf is mildly tender. No cords and no swelling   Skin:    Comments: All 10 toenails are thick and dark   Neurological:     Mental Status: He is alert.           Assessment & Plan:  He has a calf muscle strain. This should heal over time. He needs to rest it and apply heat as needed. For the toenails, treat with Terbinafine.  [name], MD

## 2020-04-30 ENCOUNTER — Encounter: Payer: Self-pay | Admitting: Family Medicine

## 2020-04-30 ENCOUNTER — Ambulatory Visit (INDEPENDENT_AMBULATORY_CARE_PROVIDER_SITE_OTHER): Payer: Medicare HMO

## 2020-04-30 ENCOUNTER — Other Ambulatory Visit: Payer: Self-pay

## 2020-04-30 ENCOUNTER — Ambulatory Visit (INDEPENDENT_AMBULATORY_CARE_PROVIDER_SITE_OTHER): Payer: Medicare HMO | Admitting: Family Medicine

## 2020-04-30 VITALS — BP 160/100 | HR 72 | Temp 98.3°F | Ht 67.0 in | Wt 167.2 lb

## 2020-04-30 DIAGNOSIS — Z0000 Encounter for general adult medical examination without abnormal findings: Secondary | ICD-10-CM

## 2020-04-30 DIAGNOSIS — R059 Cough, unspecified: Secondary | ICD-10-CM

## 2020-04-30 DIAGNOSIS — Z23 Encounter for immunization: Secondary | ICD-10-CM

## 2020-04-30 NOTE — Addendum Note (Signed)
Addended by: Wilford Corner on: 04/30/2020 10:43 AM   Modules accepted: Orders

## 2020-04-30 NOTE — Addendum Note (Signed)
Addended by: Wilford Corner on: 04/30/2020 10:46 AM   Modules accepted: Orders

## 2020-04-30 NOTE — Progress Notes (Signed)
   Subjective:    Patient ID: Darrell Baldwin, male    DOB: July 24, 1946, 74 y.o.   MRN: [id number]  HPI Here for a well exam. He feels fine. His BP is at this morning because he has not taken any medication yet.    Review of Systems  Constitutional: Negative.   HENT: Negative.   Eyes: Negative.   Respiratory: Negative.   Cardiovascular: Negative.   Gastrointestinal: Negative.   Genitourinary: Negative.   Musculoskeletal: Negative.   Skin: Negative.   Neurological: Negative.   Psychiatric/Behavioral: Negative.        Objective:   Physical Exam Constitutional:      General: He is not in acute distress.    Appearance: He is well-developed. He is not diaphoretic.  HENT:     Head: Normocephalic and atraumatic.     Right Ear: External ear normal.     Left Ear: External ear normal.     Nose: Nose normal.     Mouth/Throat:     Pharynx: No oropharyngeal exudate.  Eyes:     General: No scleral icterus.       Right eye: No discharge.        Left eye: No discharge.     Conjunctiva/sclera: Conjunctivae normal.     Pupils: Pupils are equal, round, and reactive to light.  Neck:     Thyroid: No thyromegaly.     Vascular: No JVD.     Trachea: No tracheal deviation.  Cardiovascular:     Rate and Rhythm: Normal rate and regular rhythm.     Heart sounds: Normal heart sounds. No murmur heard.  No friction rub. No gallop.   Pulmonary:     Effort: Pulmonary effort is normal. No respiratory distress.     Breath sounds: Normal breath sounds. No wheezing or rales.  Chest:     Chest wall: No tenderness.  Abdominal:     General: Bowel sounds are normal. There is no distension.     Palpations: Abdomen is soft. There is no mass.     Tenderness: There is no abdominal tenderness. There is no guarding or rebound.  Genitourinary:    Penis: Normal. No tenderness.      Testes: Normal.     Prostate: Normal.     Rectum: Normal. Guaiac result negative.  Musculoskeletal:        General: No  tenderness. Normal range of motion.     Cervical back: Neck supple.  Lymphadenopathy:     Cervical: No cervical adenopathy.  Skin:    General: Skin is warm and dry.     Coloration: Skin is not pale.     Findings: No erythema or rash.  Neurological:     Mental Status: He is alert and oriented to person, place, and time.     Cranial Nerves: No cranial nerve deficit.     Motor: No abnormal muscle tone.     Coordination: Coordination normal.     Deep Tendon Reflexes: Reflexes are normal and symmetric. Reflexes normal.  Psychiatric:        Behavior: Behavior normal.        Thought Content: Thought content normal.        Judgment: Judgment normal.           Assessment & Plan:  Well exam. We discussed diet and exercise. Get fasting labs.  [name], MD

## 2020-05-01 LAB — CBC WITH DIFFERENTIAL/PLATELET
Absolute Monocytes: 320 cells/uL (ref 200–950)
Basophils Absolute: 31 cells/uL (ref 0–200)
Basophils Relative: 0.8 %
Eosinophils Absolute: 109 cells/uL (ref 15–500)
Eosinophils Relative: 2.8 %
HCT: 39.5 % (ref 38.5–50.0)
Hemoglobin: 13.5 g/dL (ref 13.2–17.1)
Lymphs Abs: 1650 cells/uL (ref 850–3900)
MCH: 31.8 pg (ref 27.0–33.0)
MCHC: 34.2 g/dL (ref 32.0–36.0)
MCV: 93.2 fL (ref 80.0–100.0)
MPV: 9 fL (ref 7.5–12.5)
Monocytes Relative: 8.2 %
Neutro Abs: 1790 cells/uL (ref 1500–7800)
Neutrophils Relative %: 45.9 %
Platelets: 216 10*3/uL (ref 140–400)
RBC: 4.24 10*6/uL (ref 4.20–5.80)
RDW: 14 % (ref 11.0–15.0)
Total Lymphocyte: 42.3 %
WBC: 3.9 10*3/uL (ref 3.8–10.8)

## 2020-05-01 LAB — BASIC METABOLIC PANEL
BUN/Creatinine Ratio: 11 (calc) (ref 6–22)
BUN: 14 mg/dL (ref 7–25)
CO2: 27 mmol/L (ref 20–32)
Calcium: 9.3 mg/dL (ref 8.6–10.3)
Chloride: 106 mmol/L (ref 98–110)
Creat: 1.27 mg/dL — ABNORMAL HIGH (ref 0.70–1.18)
Glucose, Bld: 101 mg/dL — ABNORMAL HIGH (ref 65–99)
Potassium: 4.8 mmol/L (ref 3.5–5.3)
Sodium: 141 mmol/L (ref 135–146)

## 2020-05-01 LAB — LIPID PANEL
Cholesterol: 217 mg/dL — ABNORMAL HIGH (ref <200)
HDL: 50 mg/dL (ref >=40)
LDL Cholesterol (Calc): 145 mg/dL (calc) — ABNORMAL HIGH
Non-HDL Cholesterol (Calc): 167 mg/dL (calc) — ABNORMAL HIGH (ref <130)
Total CHOL/HDL Ratio: 4.3 (calc) (ref <5.0)
Triglycerides: 107 mg/dL (ref <150)

## 2020-05-01 LAB — HEPATIC FUNCTION PANEL
AG Ratio: 1.6 (calc) (ref 1.0–2.5)
ALT: 11 U/L (ref 9–46)
AST: 16 U/L (ref 10–35)
Albumin: 4.2 g/dL (ref 3.6–5.1)
Alkaline phosphatase (APISO): 113 U/L (ref 35–144)
Bilirubin, Direct: 0.1 mg/dL (ref 0.0–0.2)
Globulin: 2.7 g/dL (calc) (ref 1.9–3.7)
Indirect Bilirubin: 0.5 mg/dL (calc) (ref 0.2–1.2)
Total Bilirubin: 0.6 mg/dL (ref 0.2–1.2)
Total Protein: 6.9 g/dL (ref 6.1–8.1)

## 2020-05-01 LAB — TSH: TSH: 1 mIU/L (ref 0.40–4.50)

## 2020-05-01 LAB — PSA: PSA: 2.48 ng/mL (ref <=4.0)

## 2020-05-04 ENCOUNTER — Other Ambulatory Visit: Payer: Self-pay

## 2020-05-05 ENCOUNTER — Ambulatory Visit (INDEPENDENT_AMBULATORY_CARE_PROVIDER_SITE_OTHER): Payer: Medicare HMO | Admitting: Family Medicine

## 2020-05-05 ENCOUNTER — Encounter: Payer: Self-pay | Admitting: Family Medicine

## 2020-05-05 ENCOUNTER — Telehealth: Payer: Self-pay

## 2020-05-05 VITALS — BP 124/80 | HR 78 | Temp 98.2°F | Ht 67.0 in | Wt 167.3 lb

## 2020-05-05 DIAGNOSIS — J189 Pneumonia, unspecified organism: Secondary | ICD-10-CM

## 2020-05-05 MED ORDER — AZITHROMYCIN 250 MG PO TABS: ORAL_TABLET | ORAL | 0 refills | Status: AC

## 2020-05-05 NOTE — Telephone Encounter (Signed)
Per Dr. Clent Ridges - order entered for Zpak. Sig: take 2 tablets today and then take 1 everyday until completed.   Rx has been sent to [facility] per patient request.

## 2020-05-05 NOTE — Progress Notes (Signed)
   Subjective:    Patient ID: Darrell Baldwin, male    DOB: 30-Jun-1946, 74 y.o.   MRN: [id number]  HPI Here to follow up on results from his physical here on 04-30-20. That day he mentioned a chronic dry cough, so we sent him for a CXR. That showed a patch of a possible infiltrate in the LLL, so we sent him a Zpack. He will pick this up today. He says he still feels fine except for the cough.   Review of Systems  Constitutional: Negative.   Respiratory: Positive for cough. Negative for shortness of breath and wheezing.   Cardiovascular: Negative.        Objective:   Physical Exam Constitutional:      Appearance: Normal appearance.  Cardiovascular:     Rate and Rhythm: Normal rate and regular rhythm.     Pulses: Normal pulses.     Heart sounds: Normal heart sounds.  Pulmonary:     Effort: Pulmonary effort is normal. No respiratory distress.     Breath sounds: Normal breath sounds. No stridor. No wheezing, rhonchi or rales.  Neurological:     Mental Status: He is alert.           Assessment & Plan:  LLL pneumonia, he will take the Zpack and then report back to [place]. [name], MD

## 2020-05-26 ENCOUNTER — Telehealth: Payer: Self-pay

## 2020-05-26 NOTE — Telephone Encounter (Signed)
New message    The wife in the office asking the nurse to call her back to discuss her husband xray.

## 2020-07-22 ENCOUNTER — Other Ambulatory Visit: Payer: Self-pay | Admitting: Internal Medicine

## 2020-08-03 ENCOUNTER — Telehealth: Payer: Self-pay | Admitting: Family Medicine

## 2020-08-03 NOTE — Telephone Encounter (Signed)
Left message asking pt to call office Please change appointment to virtual shannon will be out of office

## 2020-08-07 ENCOUNTER — Ambulatory Visit (INDEPENDENT_AMBULATORY_CARE_PROVIDER_SITE_OTHER): Payer: Medicare HMO

## 2020-08-07 DIAGNOSIS — Z0000 Encounter for general adult medical examination without abnormal findings: Secondary | ICD-10-CM

## 2020-08-07 NOTE — Progress Notes (Signed)
Subjective:   Darrell Baldwin is a 75 y.o. male who presents for an Initial Medicare Annual Wellness Visit.  I connected with Darrell Baldwin  today by telephone and verified that I am speaking with the correct person using two identifiers. Location patient: home Location provider: work Persons participating in the virtual visit: patient, provider.   I discussed the limitations, risks, security and privacy concerns of performing an evaluation and management service by telephone and the availability of in person appointments. I also discussed with the patient that there may be a patient responsible charge related to this service. The patient expressed understanding and verbally consented to this telephonic visit.    Interactive audio and video telecommunications were attempted between this provider and patient, however failed, due to patient having technical difficulties OR patient did not have access to video capability.  We continued and completed visit with audio only.      Review of Systems    N/A  Cardiac Risk Factors include: male gender;dyslipidemia;advanced age (>1255men, 26>65 women);hypertension     Objective:    Today's Vitals   There is no height or weight on file to calculate BMI.  Advanced Directives 08/07/2020 03/26/2014  Does Patient Have a Medical Advance Directive? No No  Would patient like information on creating a medical advance directive? No - Patient declined -    Current Medications (verified) Outpatient Encounter Medications as of 08/07/2020  Medication Sig  . amLODipine (NORVASC) 10 MG tablet Take 1 tablet (10 mg total) by mouth daily.  Marland Kitchen. ezetimibe (ZETIA) 10 MG tablet Take 1 tablet (10 mg total) by mouth daily.  . fluticasone (FLONASE) 50 MCG/ACT nasal spray Place 2 sprays into both nostrils daily.  Marland Kitchen. ibuprofen (ADVIL) 800 MG tablet Take 1 tablet (800 mg total) by mouth every 6 (six) hours as needed for mild pain.  Marland Kitchen. loratadine (CLARITIN) 10 MG tablet  Take 10 mg by mouth 2 (two) times daily.  Marland Kitchen. terbinafine (LAMISIL) 250 MG tablet Take 1 tablet (250 mg total) by mouth daily.  . chlorpheniramine-HYDROcodone (TUSSIONEX PENNKINETIC ER) 10-8 MG/5ML SUER Take 5 mLs by mouth every 12 (twelve) hours as needed for cough. (Patient not taking: Reported on 08/07/2020)  . dextromethorphan (DELSYM) 30 MG/5ML liquid Take by mouth as needed for cough. (Patient not taking: Reported on 08/07/2020)  . sildenafil (VIAGRA) 100 MG tablet Take 1 tablet (100 mg total) by mouth as needed for erectile dysfunction. (Patient not taking: Reported on 08/07/2020)   No facility-administered encounter medications on file as of 08/07/2020.    Allergies (verified) Statins   History: Past Medical History:  Diagnosis Date  . Allergic rhinitis   . Hypertension    Past Surgical History:  Procedure Laterality Date  . COLONOSCOPY  04/11/2014   per Dr. Juanda ChanceBrodie, diverticulosis and internal hemorrhoids, no polyps, repeat in 10 yrs    Family History  Problem Relation Age of Onset  . Breast cancer Sister   . Diabetes Sister   . Lung cancer Brother   . Liver cancer Sister   . Diabetes Sister   . Colon cancer Neg Hx    Social History   Socioeconomic History  . Marital status: Married    Spouse name: Not on file  . Number of children: Not on file  . Years of education: Not on file  . Highest education level: Not on file  Occupational History  . Not on file  Tobacco Use  . Smoking status: Never Smoker  . Smokeless  tobacco: Never Used  Vaping Use  . Vaping Use: Never used  Substance and Sexual Activity  . Alcohol use: No    Alcohol/week: 0.0 standard drinks  . Drug use: No  . Sexual activity: Not on file  Other Topics Concern  . Not on file  Social History Narrative  . Not on file   Social Determinants of Health   Financial Resource Strain: Low Risk   . Difficulty of Paying Living Expenses: Not hard at all  Food Insecurity: No Food Insecurity  . Worried  About Programme researcher, broadcasting/film/video in the Last Year: Never true  . Ran Out of Food in the Last Year: Never true  Transportation Needs: No Transportation Needs  . Lack of Transportation (Medical): No  . Lack of Transportation (Non-Medical): No  Physical Activity: Insufficiently Active  . Days of Exercise per Week: 7 days  . Minutes of Exercise per Session: 20 min  Stress: No Stress Concern Present  . Feeling of Stress : Not at all  Social Connections: Moderately Integrated  . Frequency of Communication with Friends and Family: More than three times a week  . Frequency of Social Gatherings with Friends and Family: Once a week  . Attends Religious Services: Never  . Active Member of Clubs or Organizations: Yes  . Attends Banker Meetings: Never  . Marital Status: Married    Tobacco Counseling Counseling given: Not Answered   Clinical Intake:  Pre-visit preparation completed: Yes  Pain : No/denies pain     Nutritional Risks: None Diabetes: No  How often do you need to have someone help you when you read instructions, pamphlets, or other written materials from your doctor or pharmacy?: 1 - Never What is the last grade level you completed in school?: College  Diabetic?No   Interpreter Needed?: No  Information entered by :: SCrews,LPN   Activities of Daily Living In your present state of health, do you have any difficulty performing the following activities: 08/07/2020  Hearing? N  Vision? N  Difficulty concentrating or making decisions? N  Walking or climbing stairs? N  Dressing or bathing? N  Doing errands, shopping? N  Preparing Food and eating ? N  Using the Toilet? N  In the past six months, have you accidently leaked urine? N  Do you have problems with loss of bowel control? N  Managing your Medications? N  Managing your Finances? N  Housekeeping or managing your Housekeeping? N  Some recent data might be hidden    Patient Care Team: [name], MD  as PCP - General (Family Medicine)  Indicate any recent Medical Services you may have received from other than [facility] providers in the past year (date may be approximate).     Assessment:   This is a routine wellness examination for [place].  Hearing/Vision screen  Hearing Screening   125Hz  250Hz  500Hz  1000Hz  2000Hz  3000Hz  4000Hz  6000Hz  8000Hz   Right ear:           Left ear:           Vision Screening Comments: Patient states gets eyes examined once per year with CD[facility] physical. Does not see and Ophthalmologist   Dietary issues and exercise activities discussed: Current Exercise Habits: Home exercise routine, Type of exercise: strength training/weights;walking, Time (Minutes): 20, Frequency (Times/Week): 7, Weekly Exercise (Minutes/Week): 140, Intensity: Mild  Goals    . Patient Stated     I will continue to do stretches and walk daily     .  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       Depression Screen PHQ 2/9 Scores 08/07/2020 05/08/2018 08/04/2016 08/04/2014  PHQ - 2 Score 0 0 0 0  PHQ- 9 Score 0 - - -    Fall Risk Fall Risk  08/07/2020 05/08/2018 08/04/2016 08/04/2014  Falls in the past year? 0 No No No  Number falls in past yr: 0 - - -  Injury with Fall? 0 - - -  Risk for fall due to : No Fall Risks - - -  Follow up Falls evaluation completed;Falls prevention discussed - - -    FALL RISK PREVENTION PERTAINING TO THE HOME:  Any stairs in or around the home? Yes  If so, are there any without handrails? No  Home free of loose throw rugs in walkways, pet beds, electrical cords, etc? Yes  Adequate lighting in your home to reduce risk of falls? Yes   ASSISTIVE DEVICES UTILIZED TO PREVENT FALLS:  Life alert? No  Use of a cane, walker or w/c? No  Grab bars in the bathroom? Yes  Shower chair or bench in shower? No  Elevated toilet seat or a handicapped toilet? No   Cognitive Function:   Normal cognitive status assessed by direct observation by this Nurse Health  Advisor. No abnormalities found.        Immunizations Immunization History  Administered Date(s) Administered  . Fluad Quad(high Dose 65+) 04/30/2019, 04/30/2020  . Influenza, High Dose Seasonal PF 04/26/2016, 05/23/2017, 05/08/2018  . Influenza,inj,Quad PF,6+ Mos 04/02/2013  . Moderna Sars-Covid-2 Vaccination 08/23/2019, 09/20/2019  . Pneumococcal Conjugate-13 05/08/2018  . Pneumococcal Polysaccharide-23 04/30/2019  . Tdap 04/30/2020    TDAP status: Up to date  Flu Vaccine status: Up to date  Pneumococcal vaccine status: Up to date  Covid-19 vaccine status: Completed vaccines  Qualifies for Shingles Vaccine? Yes   Zostavax completed No   Shingrix Completed?: No.    Education has been provided regarding the importance of this vaccine. Patient has been advised to call insurance company to determine out of pocket expense if they have not yet received this vaccine. Advised may also receive vaccine at local pharmacy or Health Dept. Verbalized acceptance and understanding.  Screening Tests Health Maintenance  Topic Date Due  . Hepatitis C Screening  Never done  . COVID-19 Vaccine (3 - Booster for Moderna series) 03/19/2020  . COLONOSCOPY (Pts 45-77yrs Insurance coverage will need to be confirmed)  04/11/2024  . TETANUS/TDAP  04/30/2030  . INFLUENZA VACCINE  Completed  . PNA vac Low Risk Adult  Completed    Health Maintenance  Health Maintenance Due  Topic Date Due  . Hepatitis C Screening  Never done  . COVID-19 Vaccine (3 - Booster for Moderna series) 03/19/2020    Colorectal cancer screening: Type of screening: Colonoscopy. Completed 04/11/2014. Repeat every 10 years  Lung Cancer Screening: (Low Dose CT Chest recommended if Age 35-80 years, 30 pack-year currently smoking OR have quit w/in 15years.) does not qualify.   Lung Cancer Screening Referral: N/A   Additional Screening:  Hepatitis C Screening: does qualify;  Vision Screening: Recommended annual  ophthalmology exams for early detection of glaucoma and other disorders of the eye. Is the patient up to date with their annual eye exam?  No  Who is the provider or what is the name of the office in which the patient attends annual eye exams? Does not have an eye doctor  If pt is not established  with a provider, would they like to be referred to a provider to establish care? No .   Dental Screening: Recommended annual dental exams for proper oral hygiene  Community Resource Referral / Chronic Care Management: CRR required this visit?  No   CCM required this visit?  No      Plan:     I have personally reviewed and noted the following in the patient's chart:   . Medical and social history . Use of alcohol, tobacco or illicit drugs  . Current medications and supplements . Functional ability and status . Nutritional status . Physical activity . Advanced directives . List of other physicians . Hospitalizations, surgeries, and ER visits in previous 12 months . Vitals . Screenings to include cognitive, depression, and falls . Referrals and appointments  In addition, I have reviewed and discussed with patient certain preventive protocols, quality metrics, and best practice recommendations. A written personalized care plan for preventive services as well as general preventive health recommendations were provided to patient.     Theodora Blow, LPN   [id number]   Nurse Notes: None

## 2020-08-07 NOTE — Patient Instructions (Signed)
[name] , Thank you for taking time to come for your Medicare Wellness Visit. I appreciate your ongoing commitment to your health goals. Please review the following plan we discussed and let me know if I can assist you in the future.   Screening recommendations/referrals: Colonoscopy: Up to date, next due 04/11/2024 Recommended yearly ophthalmology/optometry visit for glaucoma screening and checkup Recommended yearly dental visit for hygiene and checkup  Vaccinations: Influenza vaccine: Up to date, next due fall 2022 Pneumococcal vaccine: Completed series  Tdap vaccine: Up to date, next due 04/30/2030 Shingles vaccine: Currently due for Shingrix, if you wish to receive we recommend that you do so at your local pharmacy as it is less expensive     Advanced directives: Advance directive discussed with you today. Even though you declined this today please call our office should you change your mind and we can give you the proper paperwork for you to fill out.   Conditions/risks identified: None   Next appointment: 08/11/2021 @3 :30 PM with Nurse Health Advisor   Preventive Care 65 Years and Older, Male Preventive care refers to lifestyle choices and visits with your health care provider that can promote health and wellness. What does preventive care include?  A yearly physical exam. This is also called an annual well check.  Dental exams once or twice a year.  Routine eye exams. Ask your health care provider how often you should have your eyes checked.  Personal lifestyle choices, including:  Daily care of your teeth and gums.  Regular physical activity.  Eating a healthy diet.  Avoiding tobacco and drug use.  Limiting alcohol use.  Practicing safe sex.  Taking low doses of aspirin every day.  Taking vitamin and mineral supplements as recommended by your health care provider. What happens during an annual well check? The services and screenings done by your health  care provider during your annual well check will depend on your age, overall health, lifestyle risk factors, and family history of disease. Counseling  Your health care provider may ask you questions about your:  Alcohol use.  Tobacco use.  Drug use.  Emotional well-being.  Home and relationship well-being.  Sexual activity.  Eating habits.  History of falls.  Memory and ability to understand (cognition).  Work and work . Screening  You may have the following tests or measurements:  Height, weight, and BMI.  Blood pressure.  Lipid and cholesterol levels. These may be checked every 5 years, or more frequently if you are over 85 years old.  Skin check.  Lung cancer screening. You may have this screening every year starting at age 55 if you have a 30-pack-year history of smoking and currently smoke or have quit within the past 15 years.  Fecal occult blood test (FOBT) of the stool. You may have this test every year starting at age 78.  Flexible sigmoidoscopy or colonoscopy. You may have a sigmoidoscopy every 5 years or a colonoscopy every 10 years starting at age 6.  Prostate cancer screening. Recommendations will vary depending on your family history and other risks.  Hepatitis C blood test.  Hepatitis B blood test.  Sexually transmitted disease (STD) testing.  Diabetes screening. This is done by checking your blood sugar (glucose) after you have not eaten for a while (fasting). You may have this done every 1-3 years.  Abdominal aortic aneurysm (AAA) screening. You may need this if you are a current or former smoker.  Osteoporosis. You may be screened starting at  age 48 if you are at high risk. Talk with your health care provider about your test results, treatment options, and if necessary, the need for more tests. Vaccines  Your health care provider may recommend certain vaccines, such as:  Influenza vaccine. This is recommended every  year.  Tetanus, diphtheria, and acellular pertussis (Tdap, Td) vaccine. You may need a Td booster every 10 years.  Zoster vaccine. You may need this after age 70.  Pneumococcal 13-valent conjugate (PCV13) vaccine. One dose is recommended after age 26.  Pneumococcal polysaccharide (PPSV23) vaccine. One dose is recommended after age 55. Talk to your health care provider about which screenings and vaccines you need and how often you need them. This information is not intended to replace advice given to you by your health care provider. Make sure you discuss any questions you have with your health care provider. Document Released: 08/07/2015 Document Revised: 03/30/2016 Document Reviewed: 05/12/2015 Elsevier Interactive Patient Education  2017 [place] Prevention in the Home Falls can cause injuries. They can happen to people of all ages. There are many things you can do to make your home safe and to help prevent falls. What can I do on the outside of my home?  Regularly fix the edges of walkways and driveways and fix any cracks.  Remove anything that might make you trip as you walk through a door, such as a raised step or threshold.  Trim any bushes or trees on the path to your home.  Use bright outdoor lighting.  Clear any walking paths of anything that might make someone trip, such as rocks or tools.  Regularly check to see if handrails are loose or broken. Make sure that both sides of any steps have handrails.  Any raised decks and porches should have guardrails on the edges.  Have any leaves, snow, or ice cleared regularly.  Use sand or salt on walking paths during winter.  Clean up any spills in your garage right away. This includes oil or grease spills. What can I do in the bathroom?  Use night lights.  Install grab bars by the toilet and in the tub and shower. Do not use towel bars as grab bars.  Use non-skid mats or decals in the tub or shower.  If you  need to sit down in the shower, use a plastic, non-slip stool.  Keep the floor dry. Clean up any water that spills on the floor as soon as it happens.  Remove soap buildup in the tub or shower regularly.  Attach bath mats securely with double-sided non-slip rug tape.  Do not have throw rugs and other things on the floor that can make you trip. What can I do in the bedroom?  Use night lights.  Make sure that you have a light by your bed that is easy to reach.  Do not use any sheets or blankets that are too big for your bed. They should not hang down onto the floor.  Have a firm chair that has side arms. You can use this for support while you get dressed.  Do not have throw rugs and other things on the floor that can make you trip. What can I do in the kitchen?  Clean up any spills right away.  Avoid walking on wet floors.  Keep items that you use a lot in easy-to-reach places.  If you need to reach something above you, use a strong step stool that has a grab bar.  Keep electrical cords out of the way.  Do not use floor polish or wax that makes floors slippery. If you must use wax, use non-skid floor wax.  Do not have throw rugs and other things on the floor that can make you trip. What can I do with my stairs?  Do not leave any items on the stairs.  Make sure that there are handrails on both sides of the stairs and use them. Fix handrails that are broken or loose. Make sure that handrails are as long as the stairways.  Check any carpeting to make sure that it is firmly attached to the stairs. Fix any carpet that is loose or worn.  Avoid having throw rugs at the top or bottom of the stairs. If you do have throw rugs, attach them to the floor with carpet tape.  Make sure that you have a light switch at the top of the stairs and the bottom of the stairs. If you do not have them, ask someone to add them for you. What else can I do to help prevent falls?  Wear shoes  that:  Do not have high heels.  Have rubber bottoms.  Are comfortable and fit you well.  Are closed at the toe. Do not wear sandals.  If you use a stepladder:  Make sure that it is fully opened. Do not climb a closed stepladder.  Make sure that both sides of the stepladder are locked into place.  Ask someone to hold it for you, if possible.  Clearly mark and make sure that you can see:  Any grab bars or handrails.  First and last steps.  Where the edge of each step is.  Use tools that help you move around (mobility aids) if they are needed. These include:  Canes.  Walkers.  Scooters.  Crutches.  Turn on the lights when you go into a dark area. Replace any light bulbs as soon as they burn out.  Set up your furniture so you have a clear path. Avoid moving your furniture around.  If any of your floors are uneven, fix them.  If there are any pets around you, be aware of where they are.  Review your medicines with your doctor. Some medicines can make you feel dizzy. This can increase your chance of falling. Ask your doctor what other things that you can do to help prevent falls. This information is not intended to replace advice given to you by your health care provider. Make sure you discuss any questions you have with your health care provider. Document Released: 05/07/2009 Document Revised: 12/17/2015 Document Reviewed: 08/15/2014 Elsevier Interactive Patient Education  2017 [employer].

## 2020-08-13 DIAGNOSIS — Z20822 Contact with and (suspected) exposure to covid-19: Secondary | ICD-10-CM | POA: Diagnosis not present

## 2020-09-29 ENCOUNTER — Ambulatory Visit: Payer: Medicare HMO | Admitting: Family Medicine

## 2020-09-30 ENCOUNTER — Encounter: Payer: Self-pay | Admitting: Family Medicine

## 2020-09-30 ENCOUNTER — Other Ambulatory Visit: Payer: Self-pay

## 2020-09-30 ENCOUNTER — Ambulatory Visit (INDEPENDENT_AMBULATORY_CARE_PROVIDER_SITE_OTHER): Payer: Medicare HMO | Admitting: Family Medicine

## 2020-09-30 VITALS — BP 148/90 | HR 62 | Temp 98.3°F | Wt 163.0 lb

## 2020-09-30 DIAGNOSIS — J019 Acute sinusitis, unspecified: Secondary | ICD-10-CM | POA: Diagnosis not present

## 2020-09-30 MED ORDER — AZITHROMYCIN 250 MG PO TABS: ORAL_TABLET | ORAL | 0 refills | Status: DC

## 2020-09-30 NOTE — Progress Notes (Signed)
   Subjective:    Patient ID: CREIG LANDIN, male    DOB: 02/20/1946, 75 y.o.   MRN: [id number]  HPI Here for one week of sinus pressure, PND, and blowing yellow mucus from the nose. No cough or fever or body aches or NVD.    Review of Systems  Constitutional: Negative.   HENT: Positive for congestion, postnasal drip and sinus pressure. Negative for ear pain and sore throat.   Eyes: Negative.   Respiratory: Negative.   Cardiovascular: Negative.        Objective:   Physical Exam Constitutional:      Appearance: Normal appearance.  HENT:     Right Ear: Tympanic membrane, ear canal and external ear normal.     Left Ear: Tympanic membrane, ear canal and external ear normal.     Nose: Nose normal.     Mouth/Throat:     Pharynx: Oropharynx is clear.  Eyes:     Conjunctiva/sclera: Conjunctivae normal.  Cardiovascular:     Rate and Rhythm: Normal rate and regular rhythm.     Pulses: Normal pulses.     Heart sounds: Normal heart sounds.  Pulmonary:     Effort: Pulmonary effort is normal.     Breath sounds: Normal breath sounds.  Lymphadenopathy:     Cervical: No cervical adenopathy.  Neurological:     Mental Status: He is alert.           Assessment & Plan:  Sinusitis, treat with a Zpack. Add Mucinex prn. [name], MD

## 2020-10-02 ENCOUNTER — Other Ambulatory Visit: Payer: Self-pay | Admitting: Family Medicine

## 2020-10-27 ENCOUNTER — Ambulatory Visit (INDEPENDENT_AMBULATORY_CARE_PROVIDER_SITE_OTHER): Payer: Medicare HMO | Admitting: Family Medicine

## 2020-10-27 ENCOUNTER — Other Ambulatory Visit: Payer: Self-pay

## 2020-10-27 ENCOUNTER — Encounter: Payer: Self-pay | Admitting: Family Medicine

## 2020-10-27 VITALS — BP 138/90 | HR 68 | Temp 98.1°F | Wt 168.4 lb

## 2020-10-27 DIAGNOSIS — M5441 Lumbago with sciatica, right side: Secondary | ICD-10-CM

## 2020-10-27 MED ORDER — METHYLPREDNISOLONE 4 MG PO TBPK: ORAL_TABLET | ORAL | 0 refills | Status: DC

## 2020-10-27 NOTE — Progress Notes (Signed)
   Subjective:    Patient ID: Darrell Baldwin, male    DOB: 16-May-1946, 75 y.o.   MRN: [id number]  HPI Here for 3 weeks of sharp pain in the right lower back that radiates down the right leg. The main time it bothers him is in bed at night. He can stand and walk fairly well. No numbness or weakness in the leg. He has tried his wife's Meloxicam and her muscle relaxer with no benefit. 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    Review of Systems  Constitutional: Negative.   Respiratory: Negative.   Cardiovascular: Negative.   Musculoskeletal: Positive for back pain.       Objective:   Physical Exam Constitutional:      General: He is not in acute distress.    Appearance: Normal appearance.     Comments: He gets up and down from the exam table easily   Cardiovascular:     Rate and Rhythm: Normal rate and regular rhythm.     Pulses: Normal pulses.     Heart sounds: Normal heart sounds.  Pulmonary:     Effort: Pulmonary effort is normal.     Breath sounds: Normal breath sounds.  Musculoskeletal:     Comments: His lower back exam is normal, no spasms, no tenderness, and full ROM. Negative SLR's   Neurological:     Mental Status: He is alert.           Assessment & Plan:  Low back pain with right sided sciatica. He will try a Medrol dose pack. Recheck as needed.  [name], MD

## 2020-11-17 ENCOUNTER — Other Ambulatory Visit: Payer: Self-pay | Admitting: Family Medicine

## 2020-12-12 ENCOUNTER — Other Ambulatory Visit: Payer: Self-pay | Admitting: Family Medicine

## 2021-02-11 ENCOUNTER — Other Ambulatory Visit: Payer: Self-pay | Admitting: Family Medicine

## 2021-02-23 ENCOUNTER — Other Ambulatory Visit: Payer: Self-pay

## 2021-02-23 ENCOUNTER — Encounter: Payer: Self-pay | Admitting: Family Medicine

## 2021-02-23 ENCOUNTER — Ambulatory Visit (INDEPENDENT_AMBULATORY_CARE_PROVIDER_SITE_OTHER): Payer: Medicare HMO | Admitting: Family Medicine

## 2021-02-23 VITALS — BP 142/88 | HR 72 | Temp 98.1°F | Wt 165.4 lb

## 2021-02-23 DIAGNOSIS — M10072 Idiopathic gout, left ankle and foot: Secondary | ICD-10-CM | POA: Diagnosis not present

## 2021-02-23 DIAGNOSIS — G5622 Lesion of ulnar nerve, left upper limb: Secondary | ICD-10-CM | POA: Diagnosis not present

## 2021-02-23 DIAGNOSIS — M109 Gout, unspecified: Secondary | ICD-10-CM | POA: Insufficient documentation

## 2021-02-23 MED ORDER — METHYLPREDNISOLONE ACETATE 80 MG/ML IJ SUSP
80.0000 mg | Freq: Once | INTRAMUSCULAR | Status: AC
Start: 2021-02-23 — End: 2021-02-23
  Administered 2021-02-23: 80 mg via INTRAMUSCULAR

## 2021-02-23 MED ORDER — PREDNISONE 10 MG PO TABS: ORAL_TABLET | ORAL | 0 refills | Status: DC

## 2021-02-23 MED ORDER — METHYLPREDNISOLONE ACETATE 40 MG/ML IJ SUSP
40.0000 mg | Freq: Once | INTRAMUSCULAR | Status: AC
  Administered 2021-02-23: 40 mg via INTRAMUSCULAR

## 2021-02-23 NOTE — Progress Notes (Signed)
   Subjective:    Patient ID: Darrell Baldwin, male    DOB: [birth year], 75 y.o.   MRN: [id number]  HPI Here for 2 issues. First he has had numbness and tingling in the left forearm and the left 4th and 5th fingers for several years, and this is getting worse. He saw Dr. Roda Shutters in 2019 who then had Dr. Alvester Morin perform a nerve conduction study. 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 Darrell Baldwin did not want to pursue surgery at that time, but now he is willing to consider this. He wants to see someone else for a second opinion  however. Also one week ago he developed the sudden onset of severe pain and swelling in the left great toe and forefoot. No recent trauma.    Review of Systems  Constitutional: Negative.   Respiratory: Negative.    Cardiovascular: Negative.   Musculoskeletal:  Positive for arthralgias and joint swelling.  Neurological:  Positive for numbness.      Objective:   Physical Exam Constitutional:      Comments: Limping in pain  Cardiovascular:     Rate and Rhythm: Normal rate and regular rhythm.     Pulses: Normal pulses.     Heart sounds: Normal heart sounds.  Pulmonary:     Effort: Pulmonary effort is normal.     Breath sounds: Normal breath sounds.  Musculoskeletal:     Comments: Left forefoot and the left great MTP joint is swollen, warm, and very tender  Neurological:     Mental Status: He is alert.          Assessment & Plan:  This is an acute flare of gout. We will treat with a DepoMedrol shot today, followed by a 12 day taper of Prednisone. For the ulnar nerve entrapment, we will refer him to Hand Surgery.  Darrell Crane, MD

## 2021-02-23 NOTE — Addendum Note (Signed)
Addended by: Carola Rhine on: 02/23/2021 04:11 PM   Modules accepted: Orders

## 2021-05-04 ENCOUNTER — Ambulatory Visit (INDEPENDENT_AMBULATORY_CARE_PROVIDER_SITE_OTHER): Payer: Medicare HMO | Admitting: Family Medicine

## 2021-05-04 ENCOUNTER — Encounter: Payer: Self-pay | Admitting: Family Medicine

## 2021-05-04 ENCOUNTER — Other Ambulatory Visit: Payer: Self-pay

## 2021-05-04 VITALS — BP 148/92 | HR 67 | Temp 97.9°F | Wt 163.0 lb

## 2021-05-04 DIAGNOSIS — Z23 Encounter for immunization: Secondary | ICD-10-CM | POA: Diagnosis not present

## 2021-05-04 DIAGNOSIS — G4762 Sleep related leg cramps: Secondary | ICD-10-CM

## 2021-05-04 DIAGNOSIS — L989 Disorder of the skin and subcutaneous tissue, unspecified: Secondary | ICD-10-CM | POA: Diagnosis not present

## 2021-05-04 NOTE — Addendum Note (Signed)
Addended by: Carola Rhine on: 05/04/2021 01:23 PM   Modules accepted: Orders

## 2021-05-04 NOTE — Progress Notes (Signed)
   Subjective:    Patient ID: Darrell Baldwin, male    DOB: 1945-09-25, 75 y.o.   MRN: [id number]  HPI Here for 2 issues. First he often has leg cramps at night. These mostly occur in the calves. He drinks plenty of water every day. Also he has had a lump on the top of the head for the past 2 years, and it has been slowly getting bigger.    Review of Systems  Constitutional: Negative.   Respiratory: Negative.    Cardiovascular: Negative.   Musculoskeletal:  Positive for myalgias.      Objective:   Physical Exam Constitutional:      Appearance: Normal appearance.  Cardiovascular:     Rate and Rhythm: Normal rate and regular rhythm.     Pulses: Normal pulses.     Heart sounds: Normal heart sounds.  Pulmonary:     Effort: Pulmonary effort is normal.     Breath sounds: Normal breath sounds.  Musculoskeletal:     Comments: The legs are normal on exam, no tenderness   Skin:    Comments: There is a 40mm raised nodular lesion on the vertex of the scalp. It appears to be a nevus.  Neurological:     Mental Status: He is alert.          Assessment & Plan:  Nocturnal leg cramps, he will try taking 2 or 3 magnesium tablets (400 mg) at bedtime. We will refer him to Dermatology to remove the skin lesion. [name], MD

## 2021-06-23 DIAGNOSIS — H02836 Dermatochalasis of left eye, unspecified eyelid: Secondary | ICD-10-CM | POA: Diagnosis not present

## 2021-06-23 DIAGNOSIS — H02833 Dermatochalasis of right eye, unspecified eyelid: Secondary | ICD-10-CM | POA: Diagnosis not present

## 2021-06-23 DIAGNOSIS — H25813 Combined forms of age-related cataract, bilateral: Secondary | ICD-10-CM | POA: Diagnosis not present

## 2021-07-30 ENCOUNTER — Telehealth: Payer: Self-pay

## 2021-07-30 NOTE — Telephone Encounter (Signed)
--  Spoke with wife. She wants to know if they need latest Covid vaccine that she has seen advertised on tv. His last booster was in the summer. CDC states that a new booster came out in September. Advised to make sure a newer booster was available and to get it, if indicated.  07/29/2021 4:32:13 PM Clinical Call Durwin Nora, RN, Morrie Sheldon   07/30/21 1238: Pt advised that it is safe for him to receive the bivalent covid booster. Confirmed that his last vaccination was "over a year ago". Pt verb understanding.

## 2021-08-02 ENCOUNTER — Telehealth: Payer: Self-pay | Admitting: Family Medicine

## 2021-08-02 MED ORDER — METHYLPREDNISOLONE 4 MG PO TBPK: ORAL_TABLET | ORAL | 5 refills | Status: DC

## 2021-08-02 NOTE — Telephone Encounter (Signed)
He has another gout flare, so we will send in a Medrol dose pack

## 2021-08-10 ENCOUNTER — Ambulatory Visit: Payer: Medicare HMO

## 2021-08-11 ENCOUNTER — Ambulatory Visit (INDEPENDENT_AMBULATORY_CARE_PROVIDER_SITE_OTHER): Payer: Medicare HMO

## 2021-08-11 VITALS — Ht 67.0 in | Wt 163.0 lb

## 2021-08-11 DIAGNOSIS — Z0000 Encounter for general adult medical examination without abnormal findings: Secondary | ICD-10-CM

## 2021-08-11 NOTE — Progress Notes (Signed)
Subjective:   Darrell Baldwin is a 76 y.o. male who presents for Medicare Annual/Subsequent preventive examination.  Review of Systems    No ROS      Objective:    Today's Vitals   08/11/21 1524  Weight: 163 lb (73.9 kg)  Height: 5\' 7"  (1.702 m)   Body mass index is 25.53 kg/m.  Advanced Directives 08/11/2021 08/07/2020 03/26/2014  Does Patient Have a Medical Advance Directive? Yes No No  Type of 05/26/2014 of [place];Living will - -  Does patient want to make changes to medical advance directive? No - Patient declined - -  Copy of Healthcare Power of Attorney in Chart? No - copy requested - -  Would patient like information on creating a medical advance directive? - No - Patient declined -    Current Medications (verified) Outpatient Encounter Medications as of 08/11/2021  Medication Sig   amLODipine (NORVASC) 10 MG tablet TAKE 1 TABLET BY MOUTH EVERY DAY   chlorpheniramine-HYDROcodone (TUSSIONEX PENNKINETIC ER) 10-8 MG/5ML SUER Take 5 mLs by mouth every 12 (twelve) hours as needed for cough.   dextromethorphan (DELSYM) 30 MG/5ML liquid Take by mouth as needed for cough.   ezetimibe (ZETIA) 10 MG tablet Take 1 tablet (10 mg total) by mouth daily.   fluticasone (FLONASE) 50 MCG/ACT nasal spray Place 2 sprays into both nostrils daily.   ibuprofen (ADVIL) 800 MG tablet TAKE 1 TABLET (800 MG TOTAL) BY MOUTH EVERY 6 (SIX) HOURS AS NEEDED FOR MILD PAIN   loratadine (CLARITIN) 10 MG tablet Take 10 mg by mouth 2 (two) times daily.   methylPREDNISolone (MEDROL DOSEPAK) 4 MG TBPK tablet As directed   sildenafil (VIAGRA) 100 MG tablet Take 1 tablet (100 mg total) by mouth as needed for erectile dysfunction.   terbinafine (LAMISIL) 250 MG tablet Take 1 tablet (250 mg total) by mouth daily.   No facility-administered encounter medications on file as of 08/11/2021.    Allergies (verified) Statins   History: Past Medical History:  Diagnosis Date   Allergic  rhinitis    Hypertension    Past Surgical History:  Procedure Laterality Date   COLONOSCOPY  04/11/2014   per Dr. 04/13/2014, diverticulosis and internal hemorrhoids, no polyps, repeat in 10 yrs    Family History  Problem Relation Age of Onset   Breast cancer Sister    Diabetes Sister    Lung cancer Brother    Liver cancer Sister    Diabetes Sister    Colon cancer Neg Hx    Social History   Socioeconomic History   Marital status: Married    Spouse name: Not on file   Number of children: Not on file   Years of education: Not on file   Highest education level: Not on file  Occupational History   Not on file  Tobacco Use   Smoking status: Never   Smokeless tobacco: Never  Vaping Use   Vaping Use: Never used  Substance and Sexual Activity   Alcohol use: No    Alcohol/week: 0.0 standard drinks   Drug use: No   Sexual activity: Not on file  Other Topics Concern   Not on file  Social History Narrative   Not on file   Social Determinants of Health   Financial Resource Strain: Low Risk    Difficulty of Paying Living Expenses: Not hard at all  Food Insecurity: No Food Insecurity   Worried About Juanda Chance of Food in the Last Year:  Never true   Ran Out of Food in the Last Year: Never true  Transportation Needs: No Transportation Needs   Lack of Transportation (Medical): No   Lack of Transportation (Non-Medical): No  Physical Activity: Sufficiently Active   Days of Exercise per Week: 7 days   Minutes of Exercise per Session: 30 min  Stress: No Stress Concern Present   Feeling of Stress : Not at all  Social Connections: Socially Integrated   Frequency of Communication with Friends and Family: More than three times a week   Frequency of Social Gatherings with Friends and Family: More than three times a week   Attends Religious Services: More than 4 times per year   Active Member of [employer] or Organizations: Yes   Attends Engineer, structuralClub or Organization Meetings: More than 4 times per  year   Marital Status: Married    Tobacco Counseling Counseling given: Not Answered   Clinical Intake:   Diabetic? No   Activities of Daily Living In your present state of health, do you have any difficulty performing the following activities: 08/11/2021  Hearing? N  Vision? N  Difficulty concentrating or making decisions? N  Walking or climbing stairs? N  Dressing or bathing? N  Doing errands, shopping? N  Preparing Food and eating ? N  Using the Toilet? N  In the past six months, have you accidently leaked urine? N  Do you have problems with loss of bowel control? N  Managing your Medications? N  Managing your Finances? N  Housekeeping or managing your Housekeeping? N  Some recent data might be hidden    Patient Care Team: Nelwyn SalisburyFry, [name], MD as PCP - General (Family Medicine)  Indicate any recent Medical Services you may have received from other than [facility] providers in the past year (date may be approximate).     Assessment:   This is a routine wellness examination for Darrell Baldwin. Virtual Visit via Telephone Note  I connected with  [name] on 08/11/21 at  3:15 PM EST by telephone and verified that I am speaking with the correct person using two identifiers.  Location: Patient: Home  Provider: Office Persons participating in the virtual visit: patient/Nurse Health Advisor   I discussed the limitations, risks, security and privacy concerns of performing an evaluation and management service by telephone and the availability of in person appointments. The patient expressed understanding and agreed to proceed.  Interactive audio and video telecommunications were attempted between this nurse and patient, however failed, due to patient having technical difficulties OR patient did not have access to video capability.  We continued and completed visit with audio only.  Some vital signs may be absent or patient reported.   Tillie RungBeverly W Nizar Cutler, LPN   Hearing/Vision  screen Hearing Screening - Comments:: No difficulty hearing Vision Screening - Comments:: Wears reading glasses  Dietary issues and exercise activities discussed: Current Exercise Habits: Home exercise routine, Type of exercise: walking;stretching, Time (Minutes): 30, Frequency (Times/Week): 7, Weekly Exercise (Minutes/Week): 210, Intensity: Moderate   Goals Addressed             This Visit's Progress    Patient Stated       I will continue to do stretches and walk daily.        Depression Screen PHQ 2/9 Scores 08/11/2021 08/07/2020 05/08/2018 08/04/2016 08/04/2014  PHQ - 2 Score 0 0 0 0 0  PHQ- 9 Score - 0 - - -    Fall Risk Fall Risk  08/11/2021 08/07/2020 05/08/2018 08/04/2016 08/04/2014  Falls in the past year? 0 0 No No No  Number falls in past yr: 0 0 - - -  Injury with Fall? 0 0 - - -  Risk for fall due to : - No Fall Risks - - -  Follow up - Falls evaluation completed;Falls prevention discussed - - -    FALL RISK PREVENTION PERTAINING TO THE HOME:  Any stairs in or around the home? Yes  If so, are there any without handrails? No  Home free of loose throw rugs in walkways, pet beds, electrical cords, etc? Yes  Adequate lighting in your home to reduce risk of falls? No   ASSISTIVE DEVICES UTILIZED TO PREVENT FALLS:  Life alert? No  Use of a cane, walker or w/c? No  Grab bars in the bathroom? Yes  Shower chair or bench in shower? No  Elevated toilet seat or a handicapped toilet? Yes   TIMED UP AND GO:  Was the test performed? No . Audio Visit  Cognitive Function:      Immunizations Immunization History  Administered Date(s) Administered   Fluad Quad(high Dose 65+) 04/30/2019, 04/30/2020, 05/04/2021   Influenza, High Dose Seasonal PF 04/26/2016, 05/23/2017, 05/08/2018   Influenza,inj,Quad PF,6+ Mos 04/02/2013   Moderna Sars-Covid-2 Vaccination 08/23/2019, 09/20/2019, 05/30/2020   PFIZER(Purple Top)SARS-COV-2 Vaccination 05/24/2021   Pneumococcal  Conjugate-13 05/08/2018   Pneumococcal Polysaccharide-23 04/30/2019   Tdap 04/30/2020   Zoster Recombinat (Shingrix) 08/08/2021    Covid-19 vaccine status: Completed vaccines  Qualifies for Shingles Vaccine? Yes   Zostavax completed Yes   Shingrix Completed?: Yes  Screening Tests Health Maintenance  Topic Date Due   COVID-19 Vaccine (5 - Booster for Moderna series) 07/19/2021   Hepatitis C Screening  08/11/2022 (Originally 11/07/1963)   Zoster Vaccines- Shingrix (2 of 2) 10/03/2021   COLONOSCOPY (Pts [id number] Insurance coverage will need to be confirmed)  04/11/2024   TETANUS/TDAP  04/30/2030   Pneumonia Vaccine 5265+ Years old  Completed   INFLUENZA VACCINE  Completed   HPV VACCINES  Aged Out    Health Maintenance  Health Maintenance Due  Topic Date Due   COVID-19 Vaccine (5 - Booster for Moderna series) 07/19/2021    Additional Screening:  Hepatitis C Screening: does qualify; Completed No Order placed 08/11/21  Vision Screening: Recommended annual ophthalmology exams for early detection of glaucoma and other disorders of the eye. Is the patient up to date with their annual eye exam?  Yes  Who is the provider or what is the name of the office in which the patient attends annual eye exams? Followed by Dr Dione BoozeGroat Dental Screening: Recommended annual dental exams for proper oral hygiene  Community Resource Referral / Chronic Care Management:  CRR required this visit?  No   CCM required this visit?  No      Plan:     I have personally reviewed and noted the following in the patients chart:   Medical and social history Use of alcohol, tobacco or illicit drugs  Current medications and supplements including opioid prescriptions. Patient is not currently taking opioid prescriptions. Functional ability and status Nutritional status Physical activity Advanced directives List of other physicians Hospitalizations, surgeries, and ER visits in previous 12  months Vitals Screenings to include cognitive, depression, and falls Referrals and appointments  In addition, I have reviewed and discussed with patient certain preventive protocols, quality metrics, and best practice recommendations. A written personalized care plan for preventive services as well as general  preventive health recommendations were provided to patient.     [name], LPN   12/29/3708   Nurse Note: Order for Hep-C Screening placed today 08/11/21

## 2021-08-11 NOTE — Patient Instructions (Addendum)
[name] , Thank you for taking time to come for your Medicare Wellness Visit. I appreciate your ongoing commitment to your health goals. Please review the following plan we discussed and let me know if I can assist you in the future.   These are the goals we discussed:  Goals      Patient Stated     I will continue to do stretches and walk daily.      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         This is a list of the screening recommended for you and due dates:  Health Maintenance  Topic Date Due   COVID-19 Vaccine (5 - Booster for Moderna series) 07/19/2021   Hepatitis C Screening: USPSTF Recommendation to screen - Ages 18-79 yo.  08/11/2022*   Zoster (Shingles) Vaccine (2 of 2) 10/03/2021   Colon Cancer Screening  04/11/2024   Tetanus Vaccine  04/30/2030   Pneumonia Vaccine  Completed   Flu Shot  Completed   HPV Vaccine  Aged Out  *Topic was postponed. The date shown is not the original due date.    Advanced directives: Yes  Conditions/risks identified: None  Next appointment: Follow up in one year for your annual wellness visit. Preventive Care 4365 Years and Older, Male Preventive care refers to lifestyle choices and visits with your health care provider that can promote health and wellness. What does preventive care include? A yearly physical exam. This is also called an annual well check. Dental exams once or twice a year. Routine eye exams. Ask your health care provider how often you should have your eyes checked. Personal lifestyle choices, including: Daily care of your teeth and gums. Regular physical activity. Eating a healthy diet. Avoiding tobacco and drug use. Limiting alcohol use. Practicing safe sex. Taking low doses of aspirin every day. Taking vitamin and mineral supplements as recommended by your health care provider. What happens during an annual well check? The services and screenings done by your health care provider during your  annual well check will depend on your age, overall health, lifestyle risk factors, and family history of disease. Counseling  Your health care provider may ask you questions about your: Alcohol use. Tobacco use. Drug use. Emotional well-being. Home and relationship well-being. Sexual activity. Eating habits. History of falls. Memory and ability to understand (cognition). Work and work Astronomerenvironment. Screening  You may have the following tests or measurements: Height, weight, and BMI. Blood pressure. Lipid and cholesterol levels. These may be checked every 5 years, or more frequently if you are over 76 years old. Skin check. Lung cancer screening. You may have this screening every year starting at age 76 if you have a 30-pack-year history of smoking and currently smoke or have quit within the past 15 years. Fecal occult blood test (FOBT) of the stool. You may have this test every year starting at age 76. Flexible sigmoidoscopy or colonoscopy. You may have a sigmoidoscopy every 5 years or a colonoscopy every 10 years starting at age 76. Prostate cancer screening. Recommendations will vary depending on your family history and other risks. Hepatitis C blood test. Hepatitis B blood test. Sexually transmitted disease (STD) testing. Diabetes screening. This is done by checking your blood sugar (glucose) after you have not eaten for a while (fasting). You may have this done every 1-3 years. Abdominal aortic aneurysm (AAA) screening. You may need this if you are a current or former smoker. Osteoporosis.  You may be screened starting at age 55 if you are at high risk. Talk with your health care provider about your test results, treatment options, and if necessary, the need for more tests. Vaccines  Your health care provider may recommend certain vaccines, such as: Influenza vaccine. This is recommended every year. Tetanus, diphtheria, and acellular pertussis (Tdap, Td) vaccine. You may need a Td  booster every 10 years. Zoster vaccine. You may need this after age 56. Pneumococcal 13-valent conjugate (PCV13) vaccine. One dose is recommended after age 29. Pneumococcal polysaccharide (PPSV23) vaccine. One dose is recommended after age 73. Talk to your health care provider about which screenings and vaccines you need and how often you need them. This information is not intended to replace advice given to you by your health care provider. Make sure you discuss any questions you have with your health care provider. Document Released: 08/07/2015 Document Revised: 03/30/2016 Document Reviewed: 05/12/2015 Elsevier Interactive Patient Education  2017 [employer].  Fall Prevention in the Home Falls can cause injuries. They can happen to people of all ages. There are many things you can do to make your home safe and to help prevent falls. What can I do on the outside of my home? Regularly fix the edges of walkways and driveways and fix any cracks. Remove anything that might make you trip as you walk through a door, such as a raised step or threshold. Trim any bushes or trees on the path to your home. Use bright outdoor lighting. Clear any walking paths of anything that might make someone trip, such as rocks or tools. Regularly check to see if handrails are loose or broken. Make sure that both sides of any steps have handrails. Any raised decks and porches should have guardrails on the edges. Have any leaves, snow, or ice cleared regularly. Use sand or salt on walking paths during winter. Clean up any spills in your garage right away. This includes oil or grease spills. What can I do in the bathroom? Use night lights. Install grab bars by the toilet and in the tub and shower. Do not use towel bars as grab bars. Use non-skid mats or decals in the tub or shower. If you need to sit down in the shower, use a plastic, non-slip stool. Keep the floor dry. Clean up any water that spills on the floor  as soon as it happens. Remove soap buildup in the tub or shower regularly. Attach bath mats securely with double-sided non-slip rug tape. Do not have throw rugs and other things on the floor that can make you trip. What can I do in the bedroom? Use night lights. Make sure that you have a light by your bed that is easy to reach. Do not use any sheets or blankets that are too big for your bed. They should not hang down onto the floor. Have a firm chair that has side arms. You can use this for support while you get dressed. Do not have throw rugs and other things on the floor that can make you trip. What can I do in the kitchen? Clean up any spills right away. Avoid walking on wet floors. Keep items that you use a lot in easy-to-reach places. If you need to reach something above you, use a strong step stool that has a grab bar. Keep electrical cords out of the way. Do not use floor polish or wax that makes floors slippery. If you must use wax, use non-skid floor wax.  Do not have throw rugs and other things on the floor that can make you trip. What can I do with my stairs? Do not leave any items on the stairs. Make sure that there are handrails on both sides of the stairs and use them. Fix handrails that are broken or loose. Make sure that handrails are as long as the stairways. Check any carpeting to make sure that it is firmly attached to the stairs. Fix any carpet that is loose or worn. Avoid having throw rugs at the top or bottom of the stairs. If you do have throw rugs, attach them to the floor with carpet tape. Make sure that you have a light switch at the top of the stairs and the bottom of the stairs. If you do not have them, ask someone to add them for you. What else can I do to help prevent falls? Wear shoes that: Do not have high heels. Have rubber bottoms. Are comfortable and fit you well. Are closed at the toe. Do not wear sandals. If you use a stepladder: Make sure that it is  fully opened. Do not climb a closed stepladder. Make sure that both sides of the stepladder are locked into place. Ask someone to hold it for you, if possible. Clearly mark and make sure that you can see: Any grab bars or handrails. First and last steps. Where the edge of each step is. Use tools that help you move around (mobility aids) if they are needed. These include: Canes. Walkers. Scooters. Crutches. Turn on the lights when you go into a dark area. Replace any light bulbs as soon as they burn out. Set up your furniture so you have a clear path. Avoid moving your furniture around. If any of your floors are uneven, fix them. If there are any pets around you, be aware of where they are. Review your medicines with your doctor. Some medicines can make you feel dizzy. This can increase your chance of falling. Ask your doctor what other things that you can do to help prevent falls. This information is not intended to replace advice given to you by your health care provider. Make sure you discuss any questions you have with your health care provider. Document Released: 05/07/2009 Document Revised: 12/17/2015 Document Reviewed: 08/15/2014 Elsevier Interactive Patient Education  2017 [employer].

## 2021-08-18 ENCOUNTER — Other Ambulatory Visit (INDEPENDENT_AMBULATORY_CARE_PROVIDER_SITE_OTHER): Payer: Medicare HMO

## 2021-08-18 DIAGNOSIS — Z209 Contact with and (suspected) exposure to unspecified communicable disease: Secondary | ICD-10-CM | POA: Diagnosis not present

## 2021-08-19 LAB — HEPATITIS C ANTIBODY
Hepatitis C Ab: NONREACTIVE
SIGNAL TO CUT-OFF: <0.02 (ref <1.00)

## 2021-09-07 ENCOUNTER — Encounter: Payer: Self-pay | Admitting: Family Medicine

## 2021-09-07 ENCOUNTER — Ambulatory Visit (INDEPENDENT_AMBULATORY_CARE_PROVIDER_SITE_OTHER): Payer: Medicare HMO | Admitting: Family Medicine

## 2021-09-07 VITALS — BP 160/100 | HR 80 | Temp 98.0°F | Wt 166.0 lb

## 2021-09-07 DIAGNOSIS — M10072 Idiopathic gout, left ankle and foot: Secondary | ICD-10-CM | POA: Diagnosis not present

## 2021-09-07 MED ORDER — METHYLPREDNISOLONE ACETATE 80 MG/ML IJ SUSP
80.0000 mg | Freq: Once | INTRAMUSCULAR | Status: AC
  Administered 2021-09-07: 80 mg via INTRAMUSCULAR

## 2021-09-07 MED ORDER — METHYLPREDNISOLONE ACETATE 40 MG/ML IJ SUSP
40.0000 mg | Freq: Once | INTRAMUSCULAR | Status: AC
  Administered 2021-09-07: 40 mg via INTRAMUSCULAR

## 2021-09-07 MED ORDER — METHYLPREDNISOLONE 4 MG PO TBPK: ORAL_TABLET | ORAL | 5 refills | Status: DC

## 2021-09-07 NOTE — Addendum Note (Signed)
Addended by: Wyvonne Lenz on: 09/07/2021 10:48 AM   Modules accepted: Orders

## 2021-09-07 NOTE — Progress Notes (Signed)
° °  Subjective:    Patient ID: Darrell Baldwin, male    DOB: 06/08/1946, 76 y.o.   MRN: [id number]  HPI Here for another flare of gout in the right great toe. This started 2 days ago. No recent trauma. He also had flares last August and last month.    Review of Systems  Constitutional: Negative.   Respiratory: Negative.    Cardiovascular: Negative.   Musculoskeletal:  Positive for arthralgias.      Objective:   Physical Exam Constitutional:      General: He is not in acute distress.    Appearance: Normal appearance.  Cardiovascular:     Rate and Rhythm: Normal rate and regular rhythm.     Pulses: Normal pulses.     Heart sounds: Normal heart sounds.  Pulmonary:     Effort: Pulmonary effort is normal.     Breath sounds: Normal breath sounds.  Musculoskeletal:     Comments: Right great toe is very tender and swollen at the MTP joint   Neurological:     Mental Status: He is alert.          Assessment & Plan:  Gout. We will treat this with a shot of DepoMedrol followed by a Medrol dose pack. We will check a uric acid level today.  [name], MD

## 2021-09-08 LAB — URIC ACID: Uric Acid, Serum: 7.1 mg/dL (ref 4.0–7.8)

## 2021-09-09 ENCOUNTER — Other Ambulatory Visit: Payer: Self-pay

## 2021-09-09 DIAGNOSIS — M10072 Idiopathic gout, left ankle and foot: Secondary | ICD-10-CM

## 2021-09-09 MED ORDER — ALLOPURINOL 100 MG PO TABS
100.0000 mg | ORAL_TABLET | Freq: Every day | ORAL | 3 refills | Status: DC
Start: 2021-09-09 — End: 2022-03-16

## 2021-09-28 ENCOUNTER — Encounter: Payer: Self-pay | Admitting: Family Medicine

## 2021-09-28 ENCOUNTER — Ambulatory Visit (INDEPENDENT_AMBULATORY_CARE_PROVIDER_SITE_OTHER): Payer: Medicare HMO | Admitting: Family Medicine

## 2021-09-28 VITALS — BP 152/90 | HR 81 | Temp 98.4°F | Wt 163.4 lb

## 2021-09-28 DIAGNOSIS — M10072 Idiopathic gout, left ankle and foot: Secondary | ICD-10-CM

## 2021-09-28 NOTE — Progress Notes (Signed)
? ?  Subjective:  ? ? Patient ID: DUWAN ADRIAN, male    DOB: [birth year], 76 y.o.   MRN: [id number] ? ?HPI ?Here to discuss his gout medication. At our last visit we started him on Allopurinol 100 mg daily. However when he picked it up at the pharmacy, the pharmacist told him this was a BP medication. This concerned Johnnell so he never started taking it. He asks our opinion.  ? ? ?Review of Systems  ?Constitutional: Negative.   ?Respiratory: Negative.    ?Cardiovascular: Negative.   ?Musculoskeletal:  Positive for arthralgias.  ? ?   ?Objective:  ? Physical Exam ?Constitutional:   ?   Appearance: Normal appearance.  ?Cardiovascular:  ?   Rate and Rhythm: Normal rate and regular rhythm.  ?   Pulses: Normal pulses.  ?   Heart sounds: Normal heart sounds.  ?Pulmonary:  ?   Effort: Pulmonary effort is normal.  ?   Breath sounds: Normal breath sounds.  ?Neurological:  ?   Mental Status: He is alert.  ? ? ? ? ? ?   ?Assessment & Plan:  ?Gout.. I explained that the pharmacist made an error and this this medication is truly for gout and not HTN. He understands and will start it tonight.  ?[name], MD ? ? ?

## 2021-12-15 ENCOUNTER — Encounter: Payer: Self-pay | Admitting: Internal Medicine

## 2021-12-15 ENCOUNTER — Ambulatory Visit (INDEPENDENT_AMBULATORY_CARE_PROVIDER_SITE_OTHER): Payer: Medicare HMO | Admitting: Internal Medicine

## 2021-12-15 VITALS — BP 164/80 | HR 82 | Temp 98.2°F | Wt 166.3 lb

## 2021-12-15 DIAGNOSIS — J302 Other seasonal allergic rhinitis: Secondary | ICD-10-CM

## 2021-12-15 MED ORDER — HYDROCOD POLI-CHLORPHE POLI ER 10-8 MG/5ML PO SUER: 5.0000 mL | Freq: Two times a day (BID) | ORAL | 0 refills | Status: DC | PRN

## 2021-12-15 MED ORDER — MONTELUKAST SODIUM 10 MG PO TABS: 10.0000 mg | ORAL_TABLET | Freq: Every day | ORAL | 1 refills | Status: AC

## 2021-12-15 MED ORDER — LEVOCETIRIZINE DIHYDROCHLORIDE 5 MG PO TABS: 5.0000 mg | ORAL_TABLET | Freq: Every evening | ORAL | 1 refills | Status: AC

## 2021-12-15 MED ORDER — FLUTICASONE PROPIONATE 50 MCG/ACT NA SUSP: 2.0000 | Freq: Every day | NASAL | 11 refills | Status: AC

## 2021-12-15 NOTE — Progress Notes (Signed)
Acute office Visit     CC/Reason for Visit: Discuss allergies  HPI: Darrell Baldwin is a 76 y.o. male who is coming in today for the above mentioned reasons.  His seasonal allergies have been especially bad this year.  He constantly has postnasal drip, sneezing, coughing, watery eyes.  He only takes Claritin-D occasionally.  He is requesting refill of his cough medication.  Past Medical/Surgical History: Past Medical History:  Diagnosis Date   Allergic rhinitis    Hypertension     Past Surgical History:  Procedure Laterality Date   COLONOSCOPY  04/11/2014   per Dr. Juanda Chance, diverticulosis and internal hemorrhoids, no polyps, repeat in 10 yrs     Social History:  reports that he has never smoked. He has never used smokeless tobacco. He reports that he does not drink alcohol and does not use drugs.  Allergies: Allergies  Allergen Reactions   Statins     Muscle cramps    Family History:  Family History  Problem Relation Age of Onset   Breast cancer Sister    Diabetes Sister    Lung cancer Brother    Liver cancer Sister    Diabetes Sister    Colon cancer Neg Hx      Current Outpatient Medications:    allopurinol (ZYLOPRIM) 100 MG tablet, Take 1 tablet (100 mg total) by mouth daily., Disp: 90 tablet, Rfl: 3   amLODipine (NORVASC) 10 MG tablet, TAKE 1 TABLET BY MOUTH EVERY DAY, Disp: 90 tablet, Rfl: 3   chlorpheniramine-HYDROcodone (TUSSIONEX PENNKINETIC ER) 10-8 MG/5ML, Take 5 mLs by mouth every 12 (twelve) hours as needed for cough., Disp: 70 mL, Rfl: 0   dextromethorphan (DELSYM) 30 MG/5ML liquid, Take by mouth as needed for cough., Disp: , Rfl:    ezetimibe (ZETIA) 10 MG tablet, Take 1 tablet (10 mg total) by mouth daily., Disp: 90 tablet, Rfl: 3   ibuprofen (ADVIL) 800 MG tablet, TAKE 1 TABLET (800 MG TOTAL) BY MOUTH EVERY 6 (SIX) HOURS AS NEEDED FOR MILD PAIN, Disp: 120 tablet, Rfl: 1   levocetirizine (XYZAL) 5 MG tablet, Take 1 tablet (5 mg total) by mouth  every evening., Disp: 90 tablet, Rfl: 1   methylPREDNISolone (MEDROL DOSEPAK) 4 MG TBPK tablet, As directed, Disp: 21 tablet, Rfl: 5   montelukast (SINGULAIR) 10 MG tablet, Take 1 tablet (10 mg total) by mouth at bedtime., Disp: 90 tablet, Rfl: 1   sildenafil (VIAGRA) 100 MG tablet, Take 1 tablet (100 mg total) by mouth as needed for erectile dysfunction., Disp: 10 tablet, Rfl: 11   terbinafine (LAMISIL) 250 MG tablet, Take 1 tablet (250 mg total) by mouth daily., Disp: 90 tablet, Rfl: 1   fluticasone (FLONASE) 50 MCG/ACT nasal spray, Place 2 sprays into both nostrils daily., Disp: 16 g, Rfl: 11  Review of Systems:  Constitutional: Denies fever, chills, diaphoresis, appetite change and fatigue.  HEENT: Denies photophobia, eye pain, redness, mouth sores, trouble swallowing, neck pain, neck stiffness and tinnitus.   Respiratory: Denies SOB, DOE,  chest tightness,  and wheezing.   Cardiovascular: Denies chest pain, palpitations and leg swelling.  Gastrointestinal: Denies nausea, vomiting, abdominal pain, diarrhea, constipation, blood in stool and abdominal distention.  Genitourinary: Denies dysuria, urgency, frequency, hematuria, flank pain and difficulty urinating.  Endocrine: Denies: hot or cold intolerance, sweats, changes in hair or nails, polyuria, polydipsia. Musculoskeletal: Denies myalgias, back pain, joint swelling, arthralgias and gait problem.  Skin: Denies pallor, rash and wound.  Neurological: Denies  dizziness, seizures, syncope, weakness, light-headedness, numbness and headaches.  Hematological: Denies adenopathy. Easy bruising, personal or family bleeding history  Psychiatric/Behavioral: Denies suicidal ideation, mood changes, confusion, nervousness, sleep disturbance and agitation    Physical Exam: Vitals:   12/15/21 1441 12/15/21 1445  BP: (!) 170/110 (!) 164/80  Pulse: 82   Temp: 98.2 F (36.8 C)   TempSrc: Oral   SpO2: 98%   Weight: 166 lb 4.8 oz (75.4 kg)     Body  mass index is 26.05 kg/m.   Constitutional: NAD, calm, comfortable Eyes: PERRL, lids and conjunctivae normal ENMT: Mucous membranes are moist. Posterior pharynx is erythematous but clear of any exudate or lesions. Normal dentition.  Respiratory: clear to auscultation bilaterally, no wheezing, no crackles. Normal respiratory effort. No accessory muscle use.  Cardiovascular: Regular rate and rhythm, no murmurs / rubs / gallops. No extremity edema.  Neurologic: Grossly intact and nonfocal Psychiatric: Normal judgment and insight. Alert and oriented x 3. Normal mood.    Impression and Plan:  Seasonal allergies - Plan: levocetirizine (XYZAL) 5 MG tablet, montelukast (SINGULAIR) 10 MG tablet, fluticasone (FLONASE) 50 MCG/ACT nasal spray, chlorpheniramine-HYDROcodone (TUSSIONEX PENNKINETIC ER) 10-8 MG/5ML  -Will switch antihistamine to levocetirizine which he will take every day, will add montelukast daily, will refill Flonase and Tussionex to use as needed.  If no improvement in about 6 to 8 weeks, can consider allergist referral.  Time spent:33 minutes reviewing chart, interviewing and examining patient and formulating plan of care.    [name], MD [facility]

## 2021-12-21 ENCOUNTER — Ambulatory Visit (INDEPENDENT_AMBULATORY_CARE_PROVIDER_SITE_OTHER): Payer: Medicare HMO | Admitting: Family Medicine

## 2021-12-21 ENCOUNTER — Encounter: Payer: Self-pay | Admitting: Family Medicine

## 2021-12-21 VITALS — BP 168/82 | HR 66 | Temp 98.5°F | Wt 168.0 lb

## 2021-12-21 DIAGNOSIS — J40 Bronchitis, not specified as acute or chronic: Secondary | ICD-10-CM | POA: Diagnosis not present

## 2021-12-21 MED ORDER — AZITHROMYCIN 250 MG PO TABS: ORAL_TABLET | ORAL | 0 refills | Status: DC

## 2021-12-21 NOTE — Progress Notes (Signed)
   Subjective:    Patient ID: Darrell Baldwin, male    DOB: [birth year], 76 y.o.   MRN: [id number]  HPI Here for 2 weeks of chest tightness and a dry cough. No fever. His wife has similar symptoms.    Review of Systems  Constitutional: Negative.   HENT: Negative.    Eyes: Negative.   Respiratory:  Positive for cough and chest tightness. Negative for shortness of breath and wheezing.   Cardiovascular: Negative.   Gastrointestinal: Negative.       Objective:   Physical Exam Constitutional:      General: He is not in acute distress.    Appearance: Normal appearance.  HENT:     Right Ear: Tympanic membrane, ear canal and external ear normal.     Left Ear: Tympanic membrane, ear canal and external ear normal.     Nose: Nose normal.     Mouth/Throat:     Pharynx: Oropharynx is clear.  Eyes:     Conjunctiva/sclera: Conjunctivae normal.  Cardiovascular:     Rate and Rhythm: Normal rate and regular rhythm.     Pulses: Normal pulses.     Heart sounds: Normal heart sounds.  Pulmonary:     Effort: No respiratory distress.     Breath sounds: Wheezing and rhonchi present. No rales.  Neurological:     Mental Status: He is alert.          Assessment & Plan:  Bronchitis, treat with a Zpack.recheck as needed. Alysia Penna, MD

## 2022-01-07 ENCOUNTER — Telehealth: Payer: Self-pay | Admitting: Family Medicine

## 2022-01-07 MED ORDER — AZITHROMYCIN 250 MG PO TABS: ORAL_TABLET | ORAL | 0 refills | Status: DC

## 2022-01-07 NOTE — Telephone Encounter (Signed)
Last OV 12/21/21

## 2022-01-07 NOTE — Telephone Encounter (Signed)
Done

## 2022-01-07 NOTE — Telephone Encounter (Signed)
Pt wife call and stated he need another z-pack sent to  [id number] - [place], [place] - [address] Phone:  [phone]  Fax:  [phone]    because he still got a bad cough that want go away

## 2022-01-17 IMAGING — DX DG CHEST 2V
2 series · 2 of 2 positions shown · non-contrast
Comparison: Chest x-ray dated July 28, 2017.

CLINICAL DATA: Dry cough.

EXAM:
CHEST - 2 VIEW

[chest pa]
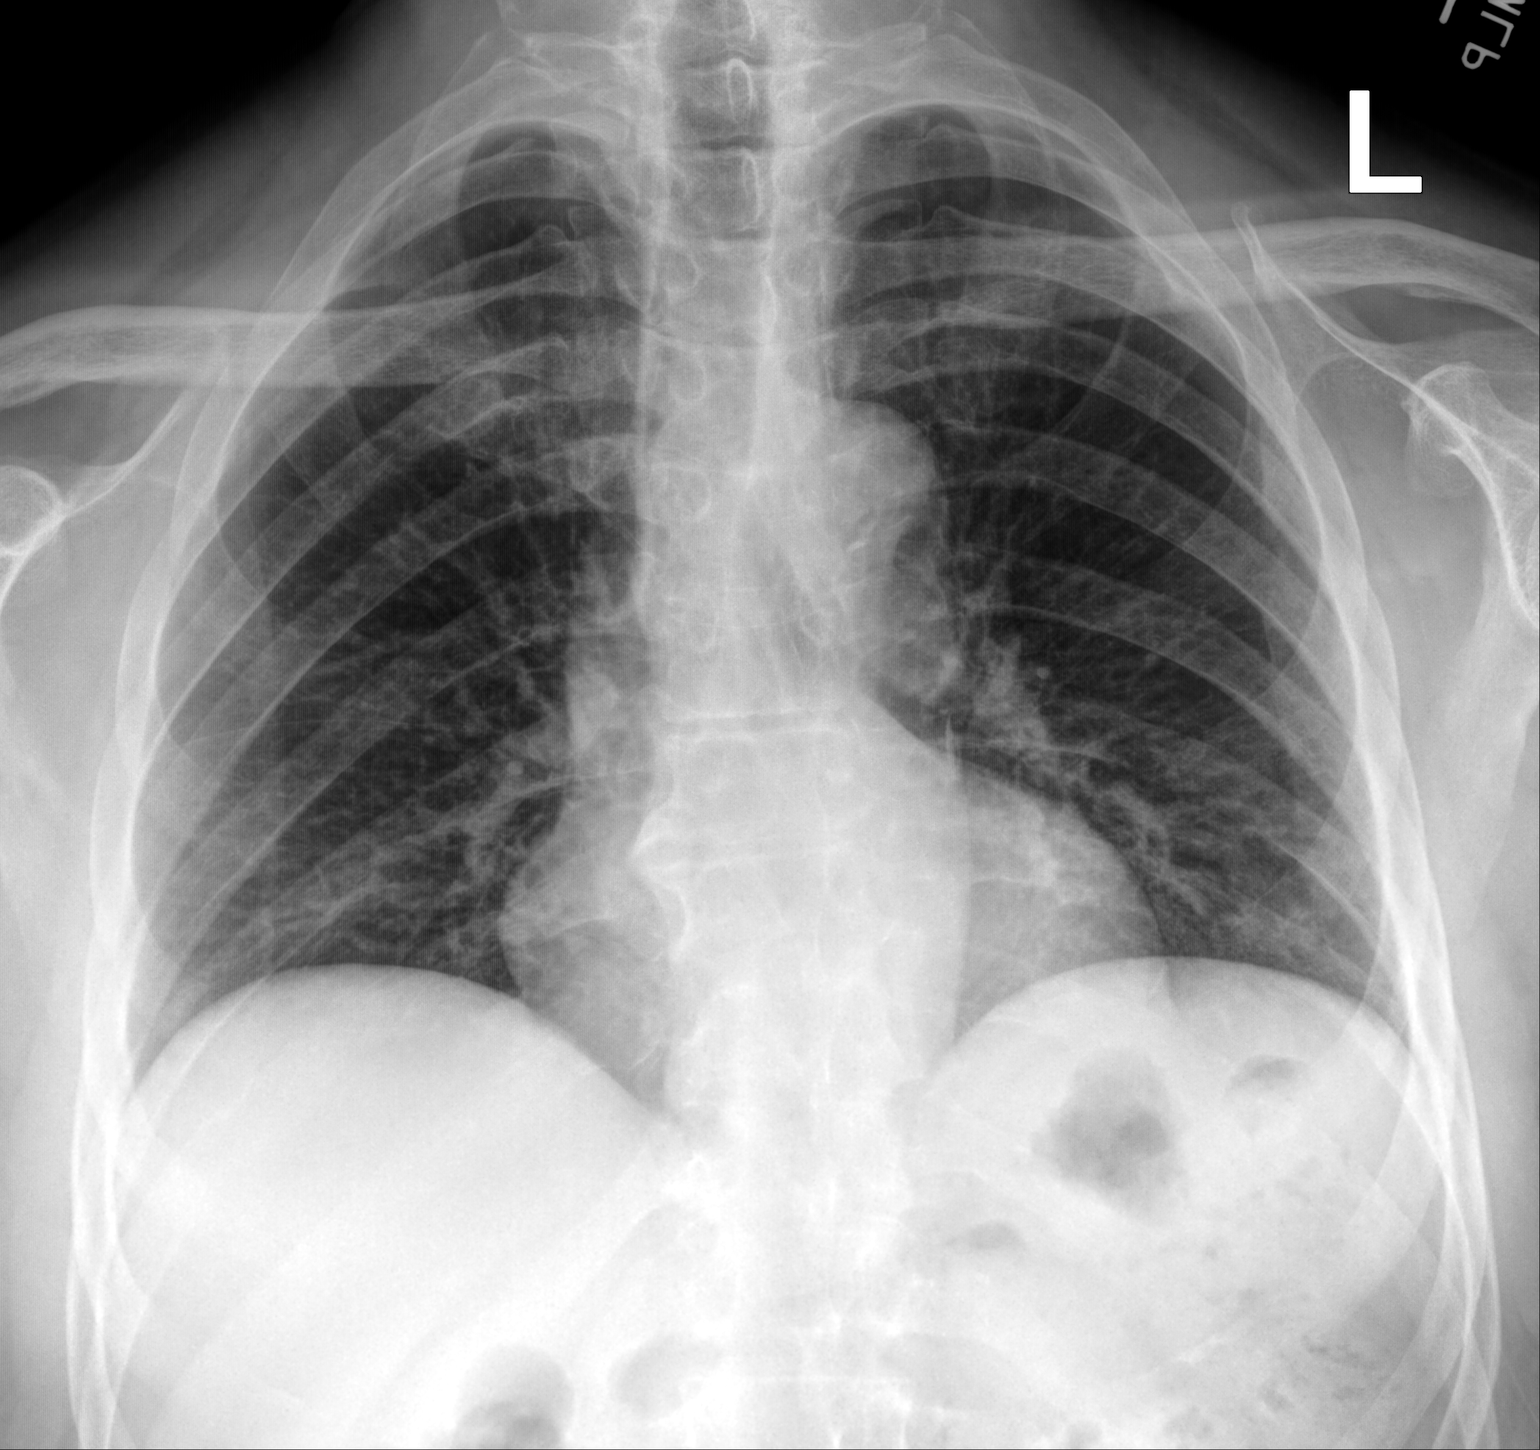

[chest lat]
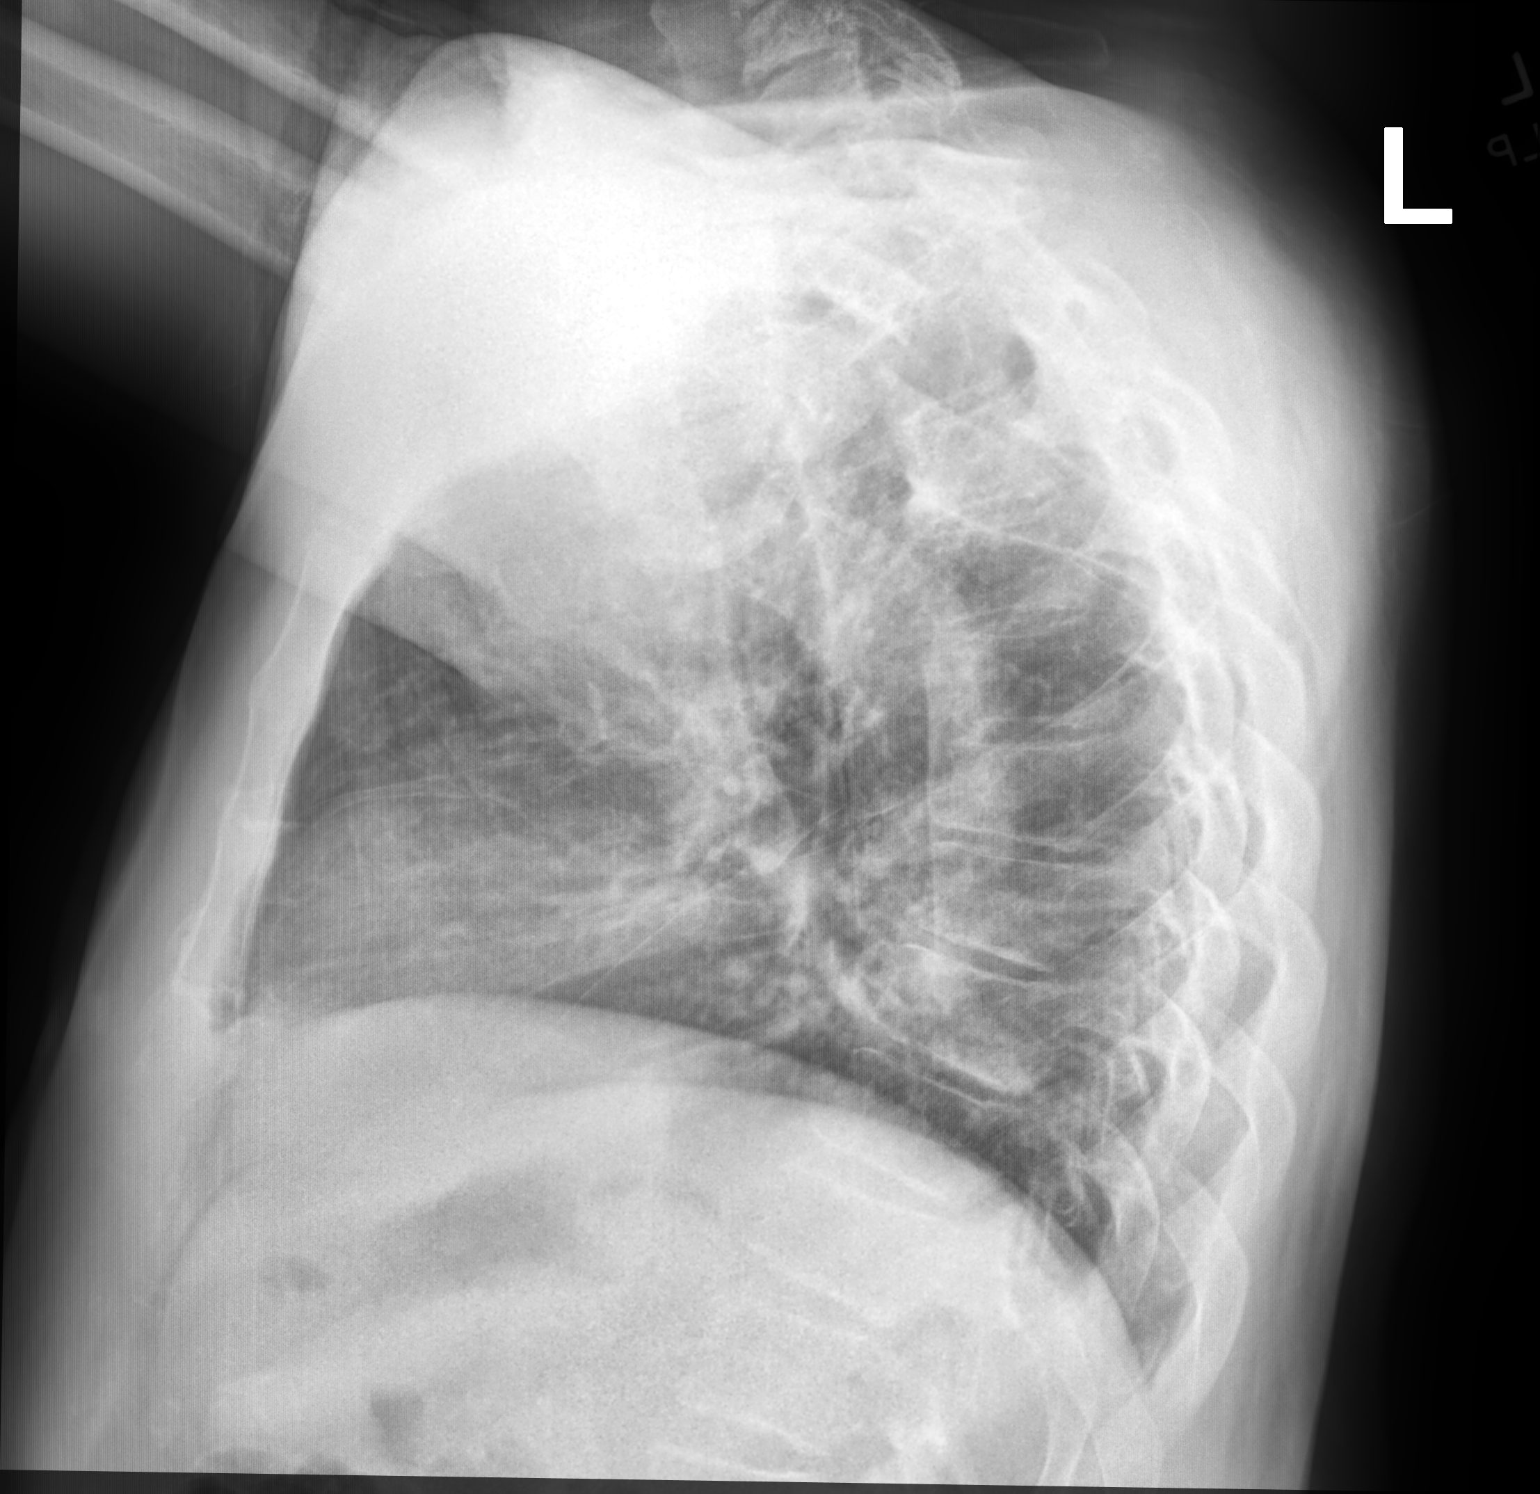

[2 of 2 positions shown; findings below may reference images not displayed]

FINDINGS: The heart size and mediastinal contours are within normal limits.
Mild patchy opacity in the left lower lobe. No pleural effusion or
pneumothorax. No acute osseous abnormality.
IMPRESSION: 1. Left lower lobe atelectasis versus infiltrate.

## 2022-02-22 ENCOUNTER — Ambulatory Visit (INDEPENDENT_AMBULATORY_CARE_PROVIDER_SITE_OTHER): Payer: Medicare HMO | Admitting: Family Medicine

## 2022-02-22 ENCOUNTER — Encounter: Payer: Self-pay | Admitting: Family Medicine

## 2022-02-22 VITALS — BP 136/82 | HR 82 | Temp 98.4°F | Wt 169.5 lb

## 2022-02-22 DIAGNOSIS — J4520 Mild intermittent asthma, uncomplicated: Secondary | ICD-10-CM | POA: Diagnosis not present

## 2022-02-22 DIAGNOSIS — J302 Other seasonal allergic rhinitis: Secondary | ICD-10-CM | POA: Diagnosis not present

## 2022-02-22 MED ORDER — ALBUTEROL SULFATE HFA 108 (90 BASE) MCG/ACT IN AERS: 2.0000 | INHALATION_SPRAY | RESPIRATORY_TRACT | 5 refills | Status: AC | PRN

## 2022-02-22 MED ORDER — HYDROCOD POLI-CHLORPHE POLI ER 10-8 MG/5ML PO SUER: 5.0000 mL | Freq: Two times a day (BID) | ORAL | 0 refills | Status: AC | PRN

## 2022-02-22 NOTE — Progress Notes (Signed)
   Subjective:    Patient ID: Darrell Baldwin, male    DOB: 03/23/1946, 76 y.o.   MRN: [id number]  HPI Here complaining of occasional episodes of mild SOB. No chest pain. This is not associated with exercise. His wife says he wheezes at times. He has some dry coughing as well.    Review of Systems  Constitutional: Negative.   Respiratory:  Positive for shortness of breath and wheezing.   Cardiovascular: Negative.        Objective:   Physical Exam Constitutional:      Appearance: Normal appearance. He is not ill-appearing.  Cardiovascular:     Rate and Rhythm: Normal rate and regular rhythm.     Pulses: Normal pulses.     Heart sounds: Normal heart sounds.  Pulmonary:     Effort: Pulmonary effort is normal.     Breath sounds: Normal breath sounds.  Neurological:     Mental Status: He is alert.           Assessment & Plan:  He likely has some mild asthma. He will try an albuterol inhaler as needed.  Darrell Crane, MD

## 2022-03-16 ENCOUNTER — Ambulatory Visit (INDEPENDENT_AMBULATORY_CARE_PROVIDER_SITE_OTHER): Payer: Medicare HMO | Admitting: Family Medicine

## 2022-03-16 ENCOUNTER — Encounter: Payer: Self-pay | Admitting: Family Medicine

## 2022-03-16 VITALS — BP 130/82 | HR 78 | Temp 97.9°F | Wt 167.4 lb

## 2022-03-16 DIAGNOSIS — M10072 Idiopathic gout, left ankle and foot: Secondary | ICD-10-CM | POA: Diagnosis not present

## 2022-03-16 DIAGNOSIS — Z9109 Other allergy status, other than to drugs and biological substances: Secondary | ICD-10-CM | POA: Diagnosis not present

## 2022-03-16 MED ORDER — METHYLPREDNISOLONE ACETATE 80 MG/ML IJ SUSP
80.0000 mg | Freq: Once | INTRAMUSCULAR | Status: AC
  Administered 2022-03-16: 80 mg via INTRAMUSCULAR

## 2022-03-16 MED ORDER — PREDNISONE 10 MG PO TABS: ORAL_TABLET | ORAL | 0 refills | Status: DC

## 2022-03-16 MED ORDER — ALLOPURINOL 300 MG PO TABS: 300.0000 mg | ORAL_TABLET | Freq: Every day | ORAL | 3 refills | Status: DC

## 2022-03-16 NOTE — Addendum Note (Signed)
Addended by: Carola Rhine on: 03/16/2022 02:38 PM   Modules accepted: Orders

## 2022-03-16 NOTE — Progress Notes (Signed)
   Subjective:    Patient ID: Darrell Baldwin, male    DOB: [birth year], 76 y.o.   MRN: [id number]  HPI Here for 2 issues. First 2 days ago he got another flare of gout. The left great toe became swollen and very painful. No recent trauma. His uric acid level on 09-17-21 was 7.1, and at that time he started on Allopurinol 100 mg daily. The other issue is his allergies. These cause a constant PND which either causes him to cough or to clear his throat. He takes Xyzal, Flonase, and Singulair daily but these do not seem to help.   Review of Systems  Constitutional: Negative.   HENT:  Positive for postnasal drip. Negative for congestion, ear pain, sinus pain and sore throat.   Eyes: Negative.   Respiratory:  Positive for cough. Negative for shortness of breath and wheezing.   Cardiovascular: Negative.   Musculoskeletal:  Positive for arthralgias.       Objective:   Physical Exam Constitutional:      Appearance: Normal appearance.  HENT:     Right Ear: Tympanic membrane, ear canal and external ear normal.     Left Ear: Tympanic membrane, ear canal and external ear normal.     Nose: Nose normal.     Mouth/Throat:     Pharynx: Oropharynx is clear.  Eyes:     Conjunctiva/sclera: Conjunctivae normal.  Pulmonary:     Effort: Pulmonary effort is normal.     Breath sounds: Normal breath sounds.  Musculoskeletal:     Comments: Left great toe is swollen and red and warm and tender over the MTP joint   Lymphadenopathy:     Cervical: No cervical adenopathy.  Neurological:     Mental Status: He is alert.           Assessment & Plan:  For the gout, he is given a shot of DepoMedrol today and we will follow this with a Prednisone taper. We will increase the Allopurinol to 300 mg daily. For the allergies, we will refer him to Allergy for testing.  [name], MD

## 2022-03-18 ENCOUNTER — Encounter: Payer: Medicare HMO | Admitting: Family Medicine

## 2022-04-04 ENCOUNTER — Encounter: Payer: Self-pay | Admitting: Family Medicine

## 2022-04-04 ENCOUNTER — Ambulatory Visit (INDEPENDENT_AMBULATORY_CARE_PROVIDER_SITE_OTHER): Payer: Medicare HMO | Admitting: Family Medicine

## 2022-04-04 VITALS — BP 120/80 | HR 66 | Temp 97.9°F | Ht 67.0 in | Wt 163.0 lb

## 2022-04-04 DIAGNOSIS — M5442 Lumbago with sciatica, left side: Secondary | ICD-10-CM | POA: Diagnosis not present

## 2022-04-04 DIAGNOSIS — R739 Hyperglycemia, unspecified: Secondary | ICD-10-CM | POA: Diagnosis not present

## 2022-04-04 DIAGNOSIS — I10 Essential (primary) hypertension: Secondary | ICD-10-CM | POA: Diagnosis not present

## 2022-04-04 DIAGNOSIS — N529 Male erectile dysfunction, unspecified: Secondary | ICD-10-CM | POA: Diagnosis not present

## 2022-04-04 DIAGNOSIS — N401 Enlarged prostate with lower urinary tract symptoms: Secondary | ICD-10-CM | POA: Diagnosis not present

## 2022-04-04 DIAGNOSIS — Z23 Encounter for immunization: Secondary | ICD-10-CM

## 2022-04-04 DIAGNOSIS — Z9109 Other allergy status, other than to drugs and biological substances: Secondary | ICD-10-CM | POA: Diagnosis not present

## 2022-04-04 DIAGNOSIS — M542 Cervicalgia: Secondary | ICD-10-CM

## 2022-04-04 DIAGNOSIS — E785 Hyperlipidemia, unspecified: Secondary | ICD-10-CM | POA: Diagnosis not present

## 2022-04-04 DIAGNOSIS — N138 Other obstructive and reflux uropathy: Secondary | ICD-10-CM

## 2022-04-04 DIAGNOSIS — R972 Elevated prostate specific antigen [PSA]: Secondary | ICD-10-CM

## 2022-04-04 DIAGNOSIS — G4700 Insomnia, unspecified: Secondary | ICD-10-CM

## 2022-04-04 DIAGNOSIS — G8929 Other chronic pain: Secondary | ICD-10-CM

## 2022-04-04 LAB — CBC WITH DIFFERENTIAL/PLATELET
Basophils Absolute: 0 10*3/uL (ref 0.0–0.1)
Basophils Relative: 0.4 % (ref 0.0–3.0)
Eosinophils Absolute: 0.1 10*3/uL (ref 0.0–0.7)
Eosinophils Relative: 1.8 % (ref 0.0–5.0)
HCT: 41.4 % (ref 39.0–52.0)
Hemoglobin: 14 g/dL (ref 13.0–17.0)
Lymphocytes Relative: 41.9 % (ref 12.0–46.0)
Lymphs Abs: 1.6 10*3/uL (ref 0.7–4.0)
MCHC: 33.7 g/dL (ref 30.0–36.0)
MCV: 95.5 fl (ref 78.0–100.0)
Monocytes Absolute: 0.3 10*3/uL (ref 0.1–1.0)
Monocytes Relative: 7.1 % (ref 3.0–12.0)
Neutro Abs: 1.9 10*3/uL (ref 1.4–7.7)
Neutrophils Relative %: 48.8 % (ref 43.0–77.0)
Platelets: 205 10*3/uL (ref 150.0–400.0)
RBC: 4.33 Mil/uL (ref 4.22–5.81)
RDW: 14.1 % (ref 11.5–15.5)
WBC: 3.8 10*3/uL — ABNORMAL LOW (ref 4.0–10.5)

## 2022-04-04 LAB — BASIC METABOLIC PANEL
BUN: 16 mg/dL (ref 6–23)
CO2: 29 mEq/L (ref 19–32)
Calcium: 9.2 mg/dL (ref 8.4–10.5)
Chloride: 107 mEq/L (ref 96–112)
Creatinine, Ser: 1.31 mg/dL (ref 0.40–1.50)
GFR: 52.91 mL/min — ABNORMAL LOW (ref >60.00)
Glucose, Bld: 94 mg/dL (ref 70–99)
Potassium: 4.8 mEq/L (ref 3.5–5.1)
Sodium: 143 mEq/L (ref 135–145)

## 2022-04-04 LAB — LIPID PANEL
Cholesterol: 255 mg/dL — ABNORMAL HIGH (ref 0–200)
HDL: 55.4 mg/dL (ref >39.00)
LDL Cholesterol: 182 mg/dL — ABNORMAL HIGH (ref 0–99)
NonHDL: 199.39
Total CHOL/HDL Ratio: 5
Triglycerides: 88 mg/dL (ref 0.0–149.0)
VLDL: 17.6 mg/dL (ref 0.0–40.0)

## 2022-04-04 LAB — PSA: PSA: 4.05 ng/mL — ABNORMAL HIGH (ref 0.10–4.00)

## 2022-04-04 LAB — HEPATIC FUNCTION PANEL
ALT: 16 U/L (ref 0–53)
AST: 19 U/L (ref 0–37)
Albumin: 3.9 g/dL (ref 3.5–5.2)
Alkaline Phosphatase: 111 U/L (ref 39–117)
Bilirubin, Direct: 0.1 mg/dL (ref 0.0–0.3)
Total Bilirubin: 0.6 mg/dL (ref 0.2–1.2)
Total Protein: 6.6 g/dL (ref 6.0–8.3)

## 2022-04-04 LAB — TSH: TSH: 0.69 u[IU]/mL (ref 0.35–5.50)

## 2022-04-04 LAB — HEMOGLOBIN A1C: Hgb A1c MFr Bld: 6.3 % (ref 4.6–6.5)

## 2022-04-04 MED ORDER — EZETIMIBE 10 MG PO TABS: 10.0000 mg | ORAL_TABLET | Freq: Every day | ORAL | 3 refills | Status: AC

## 2022-04-04 NOTE — Progress Notes (Signed)
Subjective:    Patient ID: Darrell Baldwin, male    DOB: 09/29/1945, 76 y.o.   MRN: [id number]  HPI Here to follow up on issues. He feels well today. His BP has been stable. His allergies are well controlled. He sleeps well, and he seldom has to get up to urinate during the night.    Review of Systems  Constitutional: Negative.   HENT: Negative.    Eyes: Negative.   Respiratory: Negative.    Cardiovascular: Negative.   Gastrointestinal: Negative.   Genitourinary: Negative.   Musculoskeletal:  Positive for arthralgias.  Skin: Negative.   Neurological: Negative.   Psychiatric/Behavioral: Negative.         Objective:   Physical Exam Constitutional:      General: He is not in acute distress.    Appearance: Normal appearance. He is well-developed. He is not diaphoretic.  HENT:     Head: Normocephalic and atraumatic.     Right Ear: External ear normal.     Left Ear: External ear normal.     Nose: Nose normal.     Mouth/Throat:     Pharynx: No oropharyngeal exudate.  Eyes:     General: No scleral icterus.       Right eye: No discharge.        Left eye: No discharge.     Conjunctiva/sclera: Conjunctivae normal.     Pupils: Pupils are equal, round, and reactive to light.  Neck:     Thyroid: No thyromegaly.     Vascular: No JVD.     Trachea: No tracheal deviation.  Cardiovascular:     Rate and Rhythm: Normal rate and regular rhythm.     Heart sounds: Normal heart sounds. No murmur heard.    No friction rub. No gallop.  Pulmonary:     Effort: Pulmonary effort is normal. No respiratory distress.     Breath sounds: Normal breath sounds. No wheezing or rales.  Chest:     Chest wall: No tenderness.  Abdominal:     General: Bowel sounds are normal. There is no distension.     Palpations: Abdomen is soft. There is no mass.     Tenderness: There is no abdominal tenderness. There is no guarding or rebound.  Genitourinary:    Penis: Normal. No tenderness.      Testes:  Normal.     Prostate: Normal.     Rectum: Normal. Guaiac result negative.  Musculoskeletal:        General: No tenderness. Normal range of motion.     Cervical back: Neck supple.  Lymphadenopathy:     Cervical: No cervical adenopathy.  Skin:    General: Skin is warm and dry.     Coloration: Skin is not pale.     Findings: No erythema or rash.  Neurological:     Mental Status: He is alert and oriented to person, place, and time.     Cranial Nerves: No cranial nerve deficit.     Motor: No abnormal muscle tone.     Coordination: Coordination normal.     Deep Tendon Reflexes: Reflexes are normal and symmetric. Reflexes normal.  Psychiatric:        Behavior: Behavior normal.        Thought Content: Thought content normal.        Judgment: Judgment normal.           Assessment & Plan:  His HTN  is well controlled. His allergies and neck pain  are stable. We will get fasting labs to check lipids, PSA, etc. He is given a flu shot. We spent a total of ( 31  ) minutes reviewing records and discussing these issues.  We spent a total of ( 31  ) minutes reviewing records and discussing these issues.  [name], MD

## 2022-04-04 NOTE — Addendum Note (Signed)
Addended by: Carola Rhine on: 04/04/2022 11:27 AM   Modules accepted: Orders

## 2022-04-05 ENCOUNTER — Other Ambulatory Visit: Payer: Self-pay

## 2022-04-05 DIAGNOSIS — N401 Enlarged prostate with lower urinary tract symptoms: Secondary | ICD-10-CM

## 2022-04-05 NOTE — Addendum Note (Signed)
Addended by: [name] A on: 04/05/2022 08:03 AM   Modules accepted: Orders

## 2022-05-11 DIAGNOSIS — J301 Allergic rhinitis due to pollen: Secondary | ICD-10-CM | POA: Diagnosis not present

## 2022-05-11 DIAGNOSIS — J3089 Other allergic rhinitis: Secondary | ICD-10-CM | POA: Diagnosis not present

## 2022-05-11 DIAGNOSIS — R058 Other specified cough: Secondary | ICD-10-CM | POA: Diagnosis not present

## 2022-05-19 DIAGNOSIS — J3089 Other allergic rhinitis: Secondary | ICD-10-CM | POA: Diagnosis not present

## 2022-05-19 DIAGNOSIS — J301 Allergic rhinitis due to pollen: Secondary | ICD-10-CM | POA: Diagnosis not present

## 2022-06-29 ENCOUNTER — Encounter: Payer: Self-pay | Admitting: Family Medicine

## 2022-06-29 ENCOUNTER — Ambulatory Visit (INDEPENDENT_AMBULATORY_CARE_PROVIDER_SITE_OTHER): Payer: Medicare HMO | Admitting: Family Medicine

## 2022-06-29 VITALS — BP 118/80 | HR 71 | Temp 98.6°F | Wt 166.1 lb

## 2022-06-29 DIAGNOSIS — H02836 Dermatochalasis of left eye, unspecified eyelid: Secondary | ICD-10-CM | POA: Diagnosis not present

## 2022-06-29 DIAGNOSIS — H25813 Combined forms of age-related cataract, bilateral: Secondary | ICD-10-CM | POA: Diagnosis not present

## 2022-06-29 DIAGNOSIS — S39012A Strain of muscle, fascia and tendon of lower back, initial encounter: Secondary | ICD-10-CM | POA: Diagnosis not present

## 2022-06-29 DIAGNOSIS — H02833 Dermatochalasis of right eye, unspecified eyelid: Secondary | ICD-10-CM | POA: Diagnosis not present

## 2022-06-29 MED ORDER — METHOCARBAMOL 750 MG PO TABS: 750.0000 mg | ORAL_TABLET | Freq: Four times a day (QID) | ORAL | 2 refills | Status: DC | PRN

## 2022-06-29 MED ORDER — PREDNISONE 20 MG PO TABS: ORAL_TABLET | ORAL | 0 refills | Status: DC

## 2022-06-29 NOTE — Progress Notes (Signed)
   Subjective:    Patient ID: Darrell Baldwin, male    DOB: Jan 31, 1946, 76 y.o.   MRN: [id number]  HPI Here for the sudden onset of low back pain one week ago. He was lifting some heavy boxes the day before this. The pain starts in the lower back and radiates around both flanks to the front. No pain down the legs. No numbness or weakness in the legs. He took some 800 mg Ibuprofen with no relief. 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    Review of Systems  Constitutional: Negative.   Respiratory: Negative.    Cardiovascular: Negative.   Musculoskeletal:  Positive for back pain.       Objective:   Physical Exam Constitutional:      Comments: In some pain   Cardiovascular:     Rate and Rhythm: Normal rate and regular rhythm.     Pulses: Normal pulses.     Heart sounds: Normal heart sounds.  Pulmonary:     Effort: Pulmonary effort is normal.     Breath sounds: Normal breath sounds.  Musculoskeletal:     Comments: He is tender over the lower spine and ROM is limited by pain. SLR are negative   Neurological:     Mental Status: He is alert.           Assessment & Plan:  Lumbar strain. He can apply heat. Given a Prednisone taper beginning with 40 mg BID. Add Methocarbamol as needed.  [name], MD

## 2022-07-11 ENCOUNTER — Other Ambulatory Visit: Payer: Medicare HMO

## 2022-08-03 ENCOUNTER — Ambulatory Visit (INDEPENDENT_AMBULATORY_CARE_PROVIDER_SITE_OTHER): Payer: Medicare HMO | Admitting: Family Medicine

## 2022-08-03 ENCOUNTER — Encounter: Payer: Self-pay | Admitting: Family Medicine

## 2022-08-03 VITALS — BP 130/78 | HR 74 | Temp 98.2°F | Wt 167.0 lb

## 2022-08-03 DIAGNOSIS — S76312A Strain of muscle, fascia and tendon of the posterior muscle group at thigh level, left thigh, initial encounter: Secondary | ICD-10-CM | POA: Diagnosis not present

## 2022-08-03 NOTE — Progress Notes (Signed)
   Subjective:    Patient ID: ARAFAT COCUZZA, male    DOB: 05-15-1946, 77 y.o.   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 While he was walking across the floor, he hot a wet spot and his legs went out from beneath him in different directions. No head trauma. He immediately felt pain in the posterior left thigh, and over the next 3 days he developed extensive bruising in the area. Today the bruising is fading away slowly, and the pain has greatly diminished. He ice the area the first few days, and he has taken Tylenol.    Review of Systems  Constitutional: Negative.   Respiratory: Negative.    Cardiovascular: Negative.   Musculoskeletal:  Positive for myalgias.       Objective:   Physical Exam Constitutional:      Appearance: Normal appearance.     Comments: He walks easily   Cardiovascular:     Rate and Rhythm: Normal rate and regular rhythm.     Pulses: Normal pulses.     Heart sounds: Normal heart sounds.  Pulmonary:     Effort: Pulmonary effort is normal.     Breath sounds: Normal breath sounds.  Musculoskeletal:     Comments: The posterior left thigh has a large area of faint erythema and yellow ecchymosis. The area is mildly tender. No swelling or masses.   Neurological:     Mental Status: He is alert.           Assessment & Plan:  He has a strain of the left hamstring and likely a small tear. This seems to be healing nicely. He will take it easy for a few days, and this should heal on its own. Recheck as needed.  Alysia Penna, MD

## 2022-08-15 ENCOUNTER — Ambulatory Visit (INDEPENDENT_AMBULATORY_CARE_PROVIDER_SITE_OTHER): Payer: Medicare HMO

## 2022-08-15 VITALS — Wt 167.0 lb

## 2022-08-15 DIAGNOSIS — Z0000 Encounter for general adult medical examination without abnormal findings: Secondary | ICD-10-CM

## 2022-08-15 NOTE — Patient Instructions (Signed)
[name] , Thank you for taking time to come for your Medicare Wellness Visit. I appreciate your ongoing commitment to your health goals. Please review the following plan we discussed and let me know if I can assist you in the future.   These are the goals we discussed:  Goals      Patient Stated     I will continue to do stretches and walk daily.      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      Patient Stated     Live long and healthy         This is a list of the screening recommended for you and due dates:  Health Maintenance  Topic Date Due   Zoster (Shingles) Vaccine (2 of 2) 10/03/2021   COVID-19 Vaccine (5 - 2023-24 season) 03/25/2022   Medicare Annual Wellness Visit  08/16/2023   DTaP/Tdap/Td vaccine (2 - Td or Tdap) 04/30/2030   Pneumonia Vaccine  Completed   Flu Shot  Completed   Hepatitis C Screening: USPSTF Recommendation to screen - Ages 18-79 yo.  Completed   HPV Vaccine  Aged Out   Colon Cancer Screening  Discontinued    Advanced directives: Please bring a copy of your health care power of attorney and living will to the office at your convenience.  Conditions/risks identified: live long and healthy   Next appointment: Follow up in one year for your annual wellness visit.   Preventive Care 48 Years and Older, Male  Preventive care refers to lifestyle choices and visits with your health care provider that can promote health and wellness. What does preventive care include? A yearly physical exam. This is also called an annual well check. Dental exams once or twice a year. Routine eye exams. Ask your health care provider how often you should have your eyes checked. Personal lifestyle choices, including: Daily care of your teeth and gums. Regular physical activity. Eating a healthy diet. Avoiding tobacco and drug use. Limiting alcohol use. Practicing safe sex. Taking low doses of aspirin every day. Taking vitamin and mineral supplements as  recommended by your health care provider. What happens during an annual well check? The services and screenings done by your health care provider during your annual well check will depend on your age, overall health, lifestyle risk factors, and family history of disease. Counseling  Your health care provider may ask you questions about your: Alcohol use. Tobacco use. Drug use. Emotional well-being. Home and relationship well-being. Sexual activity. Eating habits. History of falls. Memory and ability to understand (cognition). Work and work Statistician. Screening  You may have the following tests or measurements: Height, weight, and BMI. Blood pressure. Lipid and cholesterol levels. These may be checked every 5 years, or more frequently if you are over 8 years old. Skin check. Lung cancer screening. You may have this screening every year starting at age 94 if you have a 30-pack-year history of smoking and currently smoke or have quit within the past 15 years. Fecal occult blood test (FOBT) of the stool. You may have this test every year starting at age 83. Flexible sigmoidoscopy or colonoscopy. You may have a sigmoidoscopy every 5 years or a colonoscopy every 10 years starting at age 53. Prostate cancer screening. Recommendations will vary depending on your family history and other risks. Hepatitis C blood test. Hepatitis B blood test. Sexually transmitted disease (STD) testing. Diabetes screening. This is done by checking your  blood sugar (glucose) after you have not eaten for a while (fasting). You may have this done every 1-3 years. Abdominal aortic aneurysm (AAA) screening. You may need this if you are a current or former smoker. Osteoporosis. You may be screened starting at age 31 if you are at high risk. Talk with your health care provider about your test results, treatment options, and if necessary, the need for more tests. Vaccines  Your health care provider may recommend  certain vaccines, such as: Influenza vaccine. This is recommended every year. Tetanus, diphtheria, and acellular pertussis (Tdap, Td) vaccine. You may need a Td booster every 10 years. Zoster vaccine. You may need this after age 32. Pneumococcal 13-valent conjugate (PCV13) vaccine. One dose is recommended after age 36. Pneumococcal polysaccharide (PPSV23) vaccine. One dose is recommended after age 71. Talk to your health care provider about which screenings and vaccines you need and how often you need them. This information is not intended to replace advice given to you by your health care provider. Make sure you discuss any questions you have with your health care provider. Document Released: 08/07/2015 Document Revised: 03/30/2016 Document Reviewed: 05/12/2015 Elsevier Interactive Patient Education  2017 [place] Prevention in the Home Falls can cause injuries. They can happen to people of all ages. There are many things you can do to make your home safe and to help prevent falls. What can I do on the outside of my home? Regularly fix the edges of walkways and driveways and fix any cracks. Remove anything that might make you trip as you walk through a door, such as a raised step or threshold. Trim any bushes or trees on the path to your home. Use bright outdoor lighting. Clear any walking paths of anything that might make someone trip, such as rocks or tools. Regularly check to see if handrails are loose or broken. Make sure that both sides of any steps have handrails. Any raised decks and porches should have guardrails on the edges. Have any leaves, snow, or ice cleared regularly. Use sand or salt on walking paths during winter. Clean up any spills in your garage right away. This includes oil or grease spills. What can I do in the bathroom? Use night lights. Install grab bars by the toilet and in the tub and shower. Do not use towel bars as grab bars. Use non-skid mats or  decals in the tub or shower. If you need to sit down in the shower, use a plastic, non-slip stool. Keep the floor dry. Clean up any water that spills on the floor as soon as it happens. Remove soap buildup in the tub or shower regularly. Attach bath mats securely with double-sided non-slip rug tape. Do not have throw rugs and other things on the floor that can make you trip. What can I do in the bedroom? Use night lights. Make sure that you have a light by your bed that is easy to reach. Do not use any sheets or blankets that are too big for your bed. They should not hang down onto the floor. Have a firm chair that has side arms. You can use this for support while you get dressed. Do not have throw rugs and other things on the floor that can make you trip. What can I do in the kitchen? Clean up any spills right away. Avoid walking on wet floors. Keep items that you use a lot in easy-to-reach places. If you need to reach something above  you, use a strong step stool that has a grab bar. Keep electrical cords out of the way. Do not use floor polish or wax that makes floors slippery. If you must use wax, use non-skid floor wax. Do not have throw rugs and other things on the floor that can make you trip. What can I do with my stairs? Do not leave any items on the stairs. Make sure that there are handrails on both sides of the stairs and use them. Fix handrails that are broken or loose. Make sure that handrails are as long as the stairways. Check any carpeting to make sure that it is firmly attached to the stairs. Fix any carpet that is loose or worn. Avoid having throw rugs at the top or bottom of the stairs. If you do have throw rugs, attach them to the floor with carpet tape. Make sure that you have a light switch at the top of the stairs and the bottom of the stairs. If you do not have them, ask someone to add them for you. What else can I do to help prevent falls? Wear shoes that: Do not  have high heels. Have rubber bottoms. Are comfortable and fit you well. Are closed at the toe. Do not wear sandals. If you use a stepladder: Make sure that it is fully opened. Do not climb a closed stepladder. Make sure that both sides of the stepladder are locked into place. Ask someone to hold it for you, if possible. Clearly mark and make sure that you can see: Any grab bars or handrails. First and last steps. Where the edge of each step is. Use tools that help you move around (mobility aids) if they are needed. These include: Canes. Walkers. Scooters. Crutches. Turn on the lights when you go into a dark area. Replace any light bulbs as soon as they burn out. Set up your furniture so you have a clear path. Avoid moving your furniture around. If any of your floors are uneven, fix them. If there are any pets around you, be aware of where they are. Review your medicines with your doctor. Some medicines can make you feel dizzy. This can increase your chance of falling. Ask your doctor what other things that you can do to help prevent falls. This information is not intended to replace advice given to you by your health care provider. Make sure you discuss any questions you have with your health care provider. Document Released: 05/07/2009 Document Revised: 12/17/2015 Document Reviewed: 08/15/2014 Elsevier Interactive Patient Education  2017 [employer].

## 2022-08-15 NOTE — Progress Notes (Signed)
I connected with  Darrell Baldwin on 08/15/22 by a audio enabled telemedicine application and verified that I am speaking with the correct person using two identifiers.  Patient Location: Home  Provider Location: Office/Clinic  I discussed the limitations of evaluation and management by telemedicine. The patient expressed understanding and agreed to proceed.   Subjective:   Darrell Baldwin is a 77 y.o. male who presents for Medicare Annual/Subsequent preventive examination.  Review of Systems     Cardiac Risk Factors include: advanced age (>52men, >85 women);hypertension;dyslipidemia;male gender     Objective:    Today's Vitals   08/15/22 1529  Weight: 167 lb (75.8 kg)   Body mass index is 26.16 kg/m.     08/15/2022    3:35 PM 08/11/2021    3:33 PM 08/07/2020    3:41 PM 03/26/2014   12:57 PM  Advanced Directives  Does Patient Have a Medical Advance Directive? Yes Yes No No  Type of Paramedic of [place];Living will [place];Living will    Does patient want to make changes to medical advance directive?  No - Patient declined    Copy of [place] in Chart? No - copy requested No - copy requested    Would patient like information on creating a medical advance directive?   No - Patient declined     Current Medications (verified) Outpatient Encounter Medications as of 08/15/2022  Medication Sig   albuterol (VENTOLIN HFA) 108 (90 Base) MCG/ACT inhaler Inhale 2 puffs into the lungs every 4 (four) hours as needed for wheezing or shortness of breath.   allopurinol (ZYLOPRIM) 300 MG tablet Take 1 tablet (300 mg total) by mouth daily.   amLODipine (NORVASC) 10 MG tablet TAKE 1 TABLET BY MOUTH EVERY DAY   COMIRNATY syringe    EPINEPHrine 0.3 mg/0.3 mL IJ SOAJ injection as directed Injection prn for 30 days   ezetimibe (ZETIA) 10 MG tablet Take 1 tablet (10 mg total) by mouth daily.   fluticasone (FLONASE) 50 MCG/ACT  nasal spray Place 2 sprays into both nostrils daily.   ibuprofen (ADVIL) 800 MG tablet TAKE 1 TABLET (800 MG TOTAL) BY MOUTH EVERY 6 (SIX) HOURS AS NEEDED FOR MILD PAIN   levocetirizine (XYZAL) 5 MG tablet Take 1 tablet (5 mg total) by mouth every evening.   methocarbamol (ROBAXIN) 750 MG tablet Take 1 tablet (750 mg total) by mouth every 6 (six) hours as needed for muscle spasms.   montelukast (SINGULAIR) 10 MG tablet Take 1 tablet (10 mg total) by mouth at bedtime.   sildenafil (VIAGRA) 100 MG tablet Take 1 tablet (100 mg total) by mouth as needed for erectile dysfunction.   terbinafine (LAMISIL) 250 MG tablet Take 1 tablet (250 mg total) by mouth daily.   chlorpheniramine-HYDROcodone (TUSSIONEX PENNKINETIC ER) 10-8 MG/5ML Take 5 mLs by mouth every 12 (twelve) hours as needed for cough. (Patient not taking: Reported on 08/15/2022)   dextromethorphan (DELSYM) 30 MG/5ML liquid Take by mouth as needed for cough. (Patient not taking: Reported on 08/15/2022)   [DISCONTINUED] predniSONE (DELTASONE) 20 MG tablet Take 2 pills twice a day for 3 days, then 1 pill twice for 3 days, then 1 pill a day for 3 days   No facility-administered encounter medications on file as of 08/15/2022.    Allergies (verified) Statins   History: Past Medical History:  Diagnosis Date   Allergic rhinitis    Hypertension    Past Surgical History:  Procedure  Laterality Date   COLONOSCOPY  04/11/2014   per Dr. Olevia Perches, diverticulosis and internal hemorrhoids, no polyps, repeat in 71 yrs    Family History  Problem Relation Age of Onset   Breast cancer Sister    Diabetes Sister    Lung cancer Brother    Liver cancer Sister    Diabetes Sister    Colon cancer Neg Hx    Social History   Socioeconomic History   Marital status: Married    Spouse name: Not on file   Number of children: Not on file   Years of education: Not on file   Highest education level: Not on file  Occupational History   Not on file  Tobacco  Use   Smoking status: Never   Smokeless tobacco: Never  Vaping Use   Vaping Use: Never used  Substance and Sexual Activity   Alcohol use: No    Alcohol/week: 0.0 standard drinks of alcohol   Drug use: No   Sexual activity: Not on file  Other Topics Concern   Not on file  Social History Narrative   Not on file   Social Determinants of Health   Financial Resource Strain: Low Risk  (08/15/2022)   Overall Financial Resource Strain (CARDIA)    Difficulty of Paying Living Expenses: Not hard at all  Food Insecurity: No Food Insecurity (08/15/2022)   Hunger Vital Sign    Worried About Running Out of Food in the Last Year: Never true    [place] in the Last Year: Never true  Transportation Needs: No Transportation Needs (08/15/2022)   PRAPARE - Hydrologist (Medical): No    Lack of Transportation (Non-Medical): No  Physical Activity: Sufficiently Active (08/15/2022)   Exercise Vital Sign    Days of Exercise per Week: 7 days    Minutes of Exercise per Session: 30 min  Stress: No Stress Concern Present (08/15/2022)   [place]    Feeling of Stress : Not at all  Social Connections: [place] (08/15/2022)   Social Connection and Isolation Panel [NHANES]    Frequency of Communication with Friends and Family: More than three times a week    Frequency of Social Gatherings with Friends and Family: More than three times a week    Attends Religious Services: More than 4 times per year    Active Member of [employer] or Organizations: Yes    Attends Archivist Meetings: 1 to 4 times per year    Marital Status: Married    Tobacco Counseling Counseling given: Not Answered   Clinical Intake:  Pre-visit preparation completed: Yes  Pain : No/denies pain     BMI - recorded: 26.16 Nutritional Status: BMI 25 -29 Overweight Nutritional Risks: None Diabetes: No  How often do  you need to have someone help you when you read instructions, pamphlets, or other written materials from your doctor or pharmacy?: 1 - Never  Diabetic?no  Interpreter Needed?: No  Information entered by :: Charlott Rakes, LPN   Activities of Daily Living    08/15/2022    3:36 PM  In your present state of health, do you have any difficulty performing the following activities:  Hearing? 0  Vision? 0  Difficulty concentrating or making decisions? 0  Walking or climbing stairs? 0  Dressing or bathing? 0  Doing errands, shopping? 0  Preparing Food and eating ? N  Using the Toilet? N  In the past six months, have you accidently leaked urine? N  Do you have problems with loss of bowel control? N  Managing your Medications? N  Managing your Finances? N  Housekeeping or managing your Housekeeping? N    Patient Care Team: [name], MD as PCP - General (Family Medicine)  Indicate any recent Medical Services you may have received from other than [facility] providers in the past year (date may be approximate).     Assessment:   This is a routine wellness examination for [place].  Hearing/Vision screen Hearing Screening - Comments:: Pt denies any hearing issue  Vision Screening - Comments:: Pt follows up with Dr Dione Booze for annual eye exams   Dietary issues and exercise activities discussed: Current Exercise Habits: Home exercise routine, Type of exercise: Other - see comments, Time (Minutes): 30, Frequency (Times/Week): 7, Weekly Exercise (Minutes/Week): 210   Goals Addressed             This Visit's Progress    Patient Stated       Live long and healthy        Depression Screen    08/15/2022    3:34 PM 08/03/2022   11:13 AM 04/04/2022   10:25 AM 12/21/2021    3:52 PM 12/15/2021    2:49 PM 09/28/2021    4:50 PM 08/11/2021    3:27 PM  PHQ 2/9 Scores  PHQ - 2 Score 0 0 3 0 3 0 0  PHQ- 9 Score 0 0 3 0 6 1     Fall Risk    08/15/2022    3:36 PM 08/03/2022   11:14 AM  08/03/2022   11:13 AM 04/04/2022   10:25 AM 12/21/2021    3:52 PM  Fall Risk   Falls in the past year? 1 1 0    Number falls in past yr: 1 0 0 0 0  Injury with Fall? 1 1 0 0 0  Comment hamstring injury Hamstring injury     Risk for fall due to : Impaired vision No Fall Risks No Fall Risks No Fall Risks No Fall Risks  Follow up Falls prevention discussed Falls prevention discussed;Falls evaluation completed;Follow up appointment Falls evaluation completed Falls evaluation completed Falls evaluation completed    FALL RISK PREVENTION PERTAINING TO THE HOME:  Any stairs in or around the home? Yes  If so, are there any without handrails? No  Home free of loose throw rugs in walkways, pet beds, electrical cords, etc? Yes  Adequate lighting in your home to reduce risk of falls? Yes   ASSISTIVE DEVICES UTILIZED TO PREVENT FALLS:  Life alert? No  Use of a cane, walker or w/c? No  Grab bars in the bathroom? Yes  Shower chair or bench in shower? Yes  Elevated toilet seat or a handicapped toilet? No   TIMED UP AND GO:  Was the test performed? No .   Cognitive Function:        08/15/2022    3:37 PM 08/11/2021    3:30 PM  6CIT Screen  What Year? 0 points 0 points  What month? 0 points 0 points  What time? 0 points 0 points  Count back from 20 0 points 0 points  Months in reverse 0 points 0 points  Repeat phrase 0 points 0 points  Total Score 0 points 0 points    Immunizations Immunization History  Administered Date(s) Administered   Fluad Quad(high Dose 65+) 04/30/2019, 04/30/2020, 05/04/2021, 04/04/2022  Influenza, High Dose Seasonal PF 04/26/2016, 05/23/2017, 05/08/2018   Influenza,inj,Quad PF,6+ Mos 04/02/2013   Moderna Sars-Covid-2 Vaccination 08/23/2019, 09/20/2019, 05/30/2020   PFIZER(Purple Top)SARS-COV-2 Vaccination 05/24/2021   Pneumococcal Conjugate-13 05/08/2018   Pneumococcal Polysaccharide-23 04/30/2019   Tdap 04/30/2020   Zoster Recombinat (Shingrix) 08/08/2021     TDAP status: Up to date  Flu Vaccine status: Up to date  Pneumococcal vaccine status: Up to date  Covid-19 vaccine status: Completed vaccines  Qualifies for Shingles Vaccine? Yes   Zostavax completed Yes   Shingrix Completed?: No.    Education has been provided regarding the importance of this vaccine. Patient has been advised to call insurance company to determine out of pocket expense if they have not yet received this vaccine. Advised may also receive vaccine at local pharmacy or Health Dept. Verbalized acceptance and understanding.  Screening Tests Health Maintenance  Topic Date Due   Zoster Vaccines- Shingrix (2 of 2) 10/03/2021   COVID-19 Vaccine (5 - 2023-24 season) 03/25/2022   Medicare Annual Wellness (AWV)  08/16/2023   DTaP/Tdap/Td (2 - Td or Tdap) 04/30/2030   Pneumonia Vaccine 15+ Years old  Completed   INFLUENZA VACCINE  Completed   Hepatitis C Screening  Completed   HPV VACCINES  Aged Out   COLONOSCOPY (Pts 45-34yrs Insurance coverage will need to be confirmed)  Discontinued    Health Maintenance  Health Maintenance Due  Topic Date Due   Zoster Vaccines- Shingrix (2 of 2) 10/03/2021   COVID-19 Vaccine (5 - 2023-24 season) 03/25/2022    Colorectal cancer screening: No longer required.    Additional Screening:  Hepatitis C Screening: Completed 08/18/21  Vision Screening: Recommended annual ophthalmology exams for early detection of glaucoma and other disorders of the eye. Is the patient up to date with their annual eye exam?  Yes  Who is the provider or what is the name of the office in which the patient attends annual eye exams? Dr Dione Booze  If pt is not established with a provider, would they like to be referred to a provider to establish care? No .   Dental Screening: Recommended annual dental exams for proper oral hygiene  Community Resource Referral / Chronic Care Management: CRR required this visit?  No   CCM required this visit?  No       Plan:     I have personally reviewed and noted the following in the patient's chart:   Medical and social history Use of alcohol, tobacco or illicit drugs  Current medications and supplements including opioid prescriptions. Patient is not currently taking opioid prescriptions. Functional ability and status Nutritional status Physical activity Advanced directives List of other physicians Hospitalizations, surgeries, and ER visits in previous 12 months Vitals Screenings to include cognitive, depression, and falls Referrals and appointments  In addition, I have reviewed and discussed with patient certain preventive protocols, quality metrics, and best practice recommendations. A written personalized care plan for preventive services as well as general preventive health recommendations were provided to patient.     Marzella Schlein, LPN   1/61/0960   Nurse Notes: none

## 2022-08-16 ENCOUNTER — Telehealth: Payer: Self-pay | Admitting: Family Medicine

## 2022-08-16 NOTE — Telephone Encounter (Signed)
Is this about immunizations?

## 2022-08-16 NOTE — Telephone Encounter (Signed)
Pt's spouse called to ask what shots is he getting and what shots has he already had?  Please return her call.

## 2022-08-17 NOTE — Telephone Encounter (Signed)
Spoke with patient's wife.    She stated that message was complete on 08/16/22

## 2022-10-11 ENCOUNTER — Encounter: Payer: Self-pay | Admitting: Family Medicine

## 2022-10-11 ENCOUNTER — Ambulatory Visit (INDEPENDENT_AMBULATORY_CARE_PROVIDER_SITE_OTHER): Payer: Medicare HMO | Admitting: Family Medicine

## 2022-10-11 VITALS — BP 140/82 | HR 71 | Temp 97.6°F | Wt 166.4 lb

## 2022-10-11 DIAGNOSIS — I10 Essential (primary) hypertension: Secondary | ICD-10-CM

## 2022-10-11 NOTE — Progress Notes (Signed)
   Subjective:    Patient ID: Darrell Baldwin, male    DOB: 1946-01-13, 77 y.o.   MRN: [id number]  HPI Here with his wife for elevated BP readings. He went to his dentist yesterday to have 4 teeth pulled, and his BP was 172/109 and then 189/113. They cancelled the procedure and told him to see [place]. Today the BP is down some. He feels fine, no headache or SOB or chest pain. He admits that he takes his Amlodipine only sporadically, and he had not taken it for about a week prior to this dentist visit. He has now taken 2 doses.   Review of Systems  Constitutional: Negative.   Respiratory: Negative.    Cardiovascular: Negative.   Neurological: Negative.        Objective:   Physical Exam Constitutional:      Appearance: Normal appearance.  Cardiovascular:     Rate and Rhythm: Normal rate and regular rhythm.     Pulses: Normal pulses.     Heart sounds: Normal heart sounds.  Pulmonary:     Effort: Pulmonary effort is normal.     Breath sounds: Normal breath sounds.  Neurological:     Mental Status: He is alert.           Assessment & Plan:  Uncontrolled HTN. The main issue is his non-compliance, and we have addressed this numerous times before. I again stressed that he needs to take this every day for it to work, and he says he understands this now. He will report back to [place] in 3-4 weeks.  Alysia Penna, MD

## 2022-12-14 ENCOUNTER — Other Ambulatory Visit: Payer: Self-pay

## 2022-12-14 ENCOUNTER — Telehealth: Payer: Self-pay | Admitting: Family Medicine

## 2022-12-14 MED ORDER — AMLODIPINE BESYLATE 10 MG PO TABS: 10.0000 mg | ORAL_TABLET | Freq: Every day | ORAL | 3 refills | Status: DC

## 2022-12-14 NOTE — Telephone Encounter (Signed)
Pt Rx sent to pt pharmacy °

## 2022-12-14 NOTE — Telephone Encounter (Signed)
Prescription Request  12/14/2022  LOV: 10/11/2022  What is the name of the medication or equipment? amLODipine (NORVASC) 10 MG tablet   Have you contacted your pharmacy to request a refill? Yes   Which pharmacy would you like this sent to?  [id number] - [place], [place] - [address] AT [facility] OF [address] [place] [address] Phone: [phone] Fax: [phone]    Patient notified that their request is being sent to the clinical staff for review and that they should receive a response within 2 business days.   Please advise at  home at [phone]

## 2023-03-06 ENCOUNTER — Ambulatory Visit (INDEPENDENT_AMBULATORY_CARE_PROVIDER_SITE_OTHER): Payer: Medicare HMO | Admitting: Family Medicine

## 2023-03-06 ENCOUNTER — Encounter: Payer: Self-pay | Admitting: Family Medicine

## 2023-03-06 VITALS — BP 128/80 | HR 66 | Temp 98.2°F | Wt 165.2 lb

## 2023-03-06 DIAGNOSIS — S76012A Strain of muscle, fascia and tendon of left hip, initial encounter: Secondary | ICD-10-CM | POA: Diagnosis not present

## 2023-03-06 NOTE — Progress Notes (Signed)
   Subjective:    Patient ID: Darrell Baldwin, male    DOB: 1945/08/07, 77 y.o.   MRN: [id number]  HPI Here for 4 weeks of pain in the left buttock. He has been walking every day for exercise, and he remembers feeling a sudden sharp pain in the left hip area while he was walking up a hill. The pain has persisted since then. It does not radiate down the leg. Ibuprofen helps for awhile.    Review of Systems  Constitutional: Negative.   Respiratory: Negative.    Cardiovascular: Negative.   Musculoskeletal:  Positive for arthralgias.       Objective:   Physical Exam Constitutional:      General: He is not in acute distress.    Appearance: Normal appearance.  Cardiovascular:     Rate and Rhythm: Normal rate and regular rhythm.     Pulses: Normal pulses.     Heart sounds: Normal heart sounds.  Pulmonary:     Effort: Pulmonary effort is normal.     Breath sounds: Normal breath sounds.  Musculoskeletal:     Comments: Both hips show full ROM with no pain. He is tender in the lateral left buttock. The greater trochanter is not tender   Neurological:     Mental Status: He is alert.           Assessment & Plan:  He has a muscle strain which will heal over time. He will rest it and apply heat as needed. Take Ibuprofen as needed. Follow up as needed.  [name], MD

## 2023-03-15 ENCOUNTER — Telehealth (INDEPENDENT_AMBULATORY_CARE_PROVIDER_SITE_OTHER): Payer: Medicare HMO | Admitting: Adult Health

## 2023-03-15 ENCOUNTER — Ambulatory Visit (INDEPENDENT_AMBULATORY_CARE_PROVIDER_SITE_OTHER): Payer: Medicare HMO

## 2023-03-15 DIAGNOSIS — R6889 Other general symptoms and signs: Secondary | ICD-10-CM

## 2023-03-15 DIAGNOSIS — U071 COVID-19: Secondary | ICD-10-CM

## 2023-03-15 LAB — POC COVID19 BINAXNOW: SARS Coronavirus 2 Ag: POSITIVE — AB

## 2023-03-15 MED ORDER — MOLNUPIRAVIR EUA 200MG CAPSULE: 4.0000 | ORAL_CAPSULE | Freq: Two times a day (BID) | ORAL | 0 refills | Status: AC

## 2023-03-15 NOTE — Progress Notes (Addendum)
Virtual Visit via Video Note  I connected with Darrell Baldwin on 03/15/23 at  3:30 PM EDT by a video enabled telemedicine application and verified that I am speaking with the correct person using two identifiers.  Location patient: home Location provider:work or home office Persons participating in the virtual visit: patient, provider  I discussed the limitations of evaluation and management by telemedicine and the availability of in person appointments. The patient expressed understanding and agreed to proceed.   HPI: 77 year old male who  has a past medical history of Allergic rhinitis and Hypertension.  He is a patient of Dr. Clent Ridges, who I am seeing today for an acute issues. He reports that his symptoms started yesterday. His symptoms include that of a dry cough and chills.  He denies shortness of breath, fevers, body aches, or chest congestion.    ROS: See pertinent positives and negatives per HPI.  Past Medical History:  Diagnosis Date   Allergic rhinitis    Hypertension     Past Surgical History:  Procedure Laterality Date   COLONOSCOPY  04/11/2014   per Dr. Juanda Chance, diverticulosis and internal hemorrhoids, no polyps, repeat in 10 yrs     Family History  Problem Relation Age of Onset   Breast cancer Sister    Diabetes Sister    Lung cancer Brother    Liver cancer Sister    Diabetes Sister    Colon cancer Neg Hx        Current Outpatient Medications:    albuterol (VENTOLIN HFA) 108 (90 Base) MCG/ACT inhaler, Inhale 2 puffs into the lungs every 4 (four) hours as needed for wheezing or shortness of breath., Disp: 18 g, Rfl: 5   allopurinol (ZYLOPRIM) 300 MG tablet, Take 1 tablet (300 mg total) by mouth daily., Disp: 90 tablet, Rfl: 3   amLODipine (NORVASC) 10 MG tablet, Take 1 tablet (10 mg total) by mouth daily., Disp: 90 tablet, Rfl: 3   chlorpheniramine-HYDROcodone (TUSSIONEX PENNKINETIC ER) 10-8 MG/5ML, Take 5 mLs by mouth every 12 (twelve) hours as needed for  cough., Disp: 115 mL, Rfl: 0   COMIRNATY syringe, , Disp: , Rfl:    dextromethorphan (DELSYM) 30 MG/5ML liquid, Take by mouth as needed for cough., Disp: , Rfl:    EPINEPHrine 0.3 mg/0.3 mL IJ SOAJ injection, as directed Injection prn for 30 days, Disp: , Rfl:    ezetimibe (ZETIA) 10 MG tablet, Take 1 tablet (10 mg total) by mouth daily., Disp: 90 tablet, Rfl: 3   fluticasone (FLONASE) 50 MCG/ACT nasal spray, Place 2 sprays into both nostrils daily., Disp: 16 g, Rfl: 11   ibuprofen (ADVIL) 800 MG tablet, TAKE 1 TABLET (800 MG TOTAL) BY MOUTH EVERY 6 (SIX) HOURS AS NEEDED FOR MILD PAIN, Disp: 120 tablet, Rfl: 1   levocetirizine (XYZAL) 5 MG tablet, Take 1 tablet (5 mg total) by mouth every evening., Disp: 90 tablet, Rfl: 1   methocarbamol (ROBAXIN) 750 MG tablet, Take 1 tablet (750 mg total) by mouth every 6 (six) hours as needed for muscle spasms., Disp: 60 tablet, Rfl: 2   montelukast (SINGULAIR) 10 MG tablet, Take 1 tablet (10 mg total) by mouth at bedtime., Disp: 90 tablet, Rfl: 1   sildenafil (VIAGRA) 100 MG tablet, Take 1 tablet (100 mg total) by mouth as needed for erectile dysfunction., Disp: 10 tablet, Rfl: 11   terbinafine (LAMISIL) 250 MG tablet, Take 1 tablet (250 mg total) by mouth daily., Disp: 90 tablet, Rfl: 1  EXAM:  VITALS per patient if applicable:  GENERAL: alert, oriented, appears well and in no acute distress  HEENT: atraumatic, conjunttiva clear, no obvious abnormalities on inspection of external nose and ears  NECK: normal movements of the head and neck  LUNGS: on inspection no signs of respiratory distress, breathing rate appears normal, no obvious gross SOB, gasping or wheezing  CV: no obvious cyanosis  MS: moves all visible extremities without noticeable abnormality  PSYCH/NEURO: pleasant and cooperative, no obvious depression or anxiety, speech and thought processing grossly intact  ASSESSMENT AND PLAN:  Discussed the following assessment and plan:   1.  COVID-19 virus infection - POC COVID - positive  - Will treat due to age and past medical history - molnupiravir EUA (LAGEVRIO) 200 mg CAPS capsule; Take 4 capsules (800 mg total) by mouth 2 (two) times daily for 5 days.  Dispense: 40 capsule; Refill: 0     I discussed the assessment and treatment plan with the patient. The patient was provided an opportunity to ask questions and all were answered. The patient agreed with the plan and demonstrated an understanding of the instructions.   The patient was advised to call back or seek an in-person evaluation if the symptoms worsen or if the condition fails to improve as anticipated.   Shirline Frees, NP

## 2023-03-16 ENCOUNTER — Ambulatory Visit: Payer: Medicare HMO | Admitting: Adult Health

## 2023-04-04 ENCOUNTER — Ambulatory Visit (INDEPENDENT_AMBULATORY_CARE_PROVIDER_SITE_OTHER): Payer: Medicare HMO | Admitting: Family Medicine

## 2023-04-04 ENCOUNTER — Encounter: Payer: Self-pay | Admitting: Family Medicine

## 2023-04-04 VITALS — BP 122/80 | HR 60 | Temp 98.1°F | Wt 164.0 lb

## 2023-04-04 DIAGNOSIS — G8929 Other chronic pain: Secondary | ICD-10-CM | POA: Insufficient documentation

## 2023-04-04 DIAGNOSIS — M25552 Pain in left hip: Secondary | ICD-10-CM | POA: Diagnosis not present

## 2023-04-04 NOTE — Progress Notes (Signed)
   Subjective:    Patient ID: Darrell Baldwin, male    DOB: 1946/01/05, 77 y.o.   MRN: [id number]  HPI Here for left hip pain that started while he was walking about 8 weeks ago. He is taking Ibuprofen and applying topical rubs. It has improved somewhat but is still a problem.    Review of Systems  Constitutional: Negative.   Respiratory: Negative.    Cardiovascular: Negative.   Musculoskeletal:  Positive for arthralgias.       Objective:   Physical Exam Constitutional:      Appearance: Normal appearance.  Cardiovascular:     Rate and Rhythm: Normal rate and regular rhythm.     Pulses: Normal pulses.     Heart sounds: Normal heart sounds.  Pulmonary:     Effort: Pulmonary effort is normal.     Breath sounds: Normal breath sounds.  Neurological:     Mental Status: He is alert.           Assessment & Plan:  Chronic left hip pain,refer to Orthopedics.  [name], MD

## 2023-04-11 ENCOUNTER — Ambulatory Visit (INDEPENDENT_AMBULATORY_CARE_PROVIDER_SITE_OTHER): Payer: Medicare HMO | Admitting: Orthopaedic Surgery

## 2023-04-11 ENCOUNTER — Other Ambulatory Visit (INDEPENDENT_AMBULATORY_CARE_PROVIDER_SITE_OTHER): Payer: Medicare HMO

## 2023-04-11 DIAGNOSIS — M5442 Lumbago with sciatica, left side: Secondary | ICD-10-CM

## 2023-04-11 MED ORDER — PREDNISONE 5 MG (21) PO TBPK: ORAL_TABLET | ORAL | 0 refills | Status: DC

## 2023-04-11 MED ORDER — METHOCARBAMOL 750 MG PO TABS: 750.0000 mg | ORAL_TABLET | Freq: Three times a day (TID) | ORAL | 2 refills | Status: DC | PRN

## 2023-04-11 MED ORDER — TRAMADOL HCL 50 MG PO TABS: 50.0000 mg | ORAL_TABLET | Freq: Two times a day (BID) | ORAL | 1 refills | Status: DC | PRN

## 2023-04-11 NOTE — Progress Notes (Signed)
Office Visit Note   Patient: Darrell Baldwin           Date of Birth: 11/10/1945           MRN: [id number] Visit Date: 04/11/2023              Requested by: [name], MD [address] [place],  [place] [address] PCP: [name], MD   Assessment & Plan: Visit Diagnoses:  1. Acute left-sided low back pain with left-sided sciatica     Plan: Impression is chronic left-sided low back pain with recent exacerbation.  Symptoms of not improved with prescription medication or a physician guided home exercise program for over the past 6 weeks.  We would like to obtain a new MRI of the lumbar spine to evaluate for structural abnormalities.  He will follow-up with Ellin Goodie to go over the MRI.  I have sent in steroids, muscle relaxers and tramadol to take in the meantime.  Call with concerns or questions.  Follow-Up Instructions: Return for f/u with megan williams to discuss mri.   Orders:  Orders Placed This Encounter  Procedures   XR Lumbar Spine 2-3 Views   Meds ordered this encounter  Medications   predniSONE (STERAPRED UNI-PAK 21 TAB) 5 MG (21) TBPK tablet    Sig: Take as directed    Dispense:  21 tablet    Refill:  0   methocarbamol (ROBAXIN) 750 MG tablet    Sig: Take 1 tablet (750 mg total) by mouth every 8 (eight) hours as needed for muscle spasms.    Dispense:  20 tablet    Refill:  2   traMADol (ULTRAM) 50 MG tablet    Sig: Take 1 tablet (50 mg total) by mouth every 12 (twelve) hours as needed.    Dispense:  30 tablet    Refill:  1      Procedures: No procedures performed   Clinical Data: No additional findings.   Subjective: Chief Complaint  Patient presents with   Left Hip - Pain    HPI patient is a pleasant 77 year old gentleman who comes in today with chronic left-sided low back pain for the past month and a half.  No specific injury but he notes he did a lot of walking the day his pain started.  He denies any pain to the groin.  No  radiation down either leg.  Symptoms are worse when he is lying down to sleep.  He has been taking ibuprofen without relief.  No paresthesias and no bowel or bladder change.  Does have a history of low back pain where he has undergone 5 injections.  He tells me his primary care provider prescribed a steroid pack about a month ago which did not help.  He has been working on a guided home exercise program for over 6 weeks without relief.  Review of Systems as detailed in HPI.  All others reviewed and are negative.   Objective: Vital Signs: There were no vitals taken for this visit.  Physical Exam well-developed well-nourished gentleman in no acute distress.  Alert and oriented x 3.  Ortho Exam lumbar spine exam: He does have left-sided paraspinous musculature tenderness.  No spinous tenderness.  Increased pain with lumbar flexion and extension.  Negative straight leg raise both sides.  No focal weakness.  He is neurovascularly intact distally.  Specialty Comments:  No specialty comments available.  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   Statin intolerance 08/13/2019   Pain in left finger(s) 09/18/2018   Dyslipidemia 09/13/2018   OSA (obstructive sleep apnea) 10/17/2017   Seasonal allergies 05/29/2017   Insomnia 10/25/2016   Chronic neck pain 08/04/2016   Low back pain with left-sided sciatica 12/24/2014   Erectile dysfunction 08/15/2013   Environmental allergies 04/25/2013   HTN (hypertension) 04/02/2013   Past Medical History:  Diagnosis Date   Allergic rhinitis    Hypertension     Family History  Problem Relation Age of Onset   Breast cancer Sister    Diabetes Sister    Lung cancer Brother     Liver cancer Sister    Diabetes Sister    Colon cancer Neg Hx     Past Surgical History:  Procedure Laterality Date   COLONOSCOPY  04/11/2014   per Dr. Juanda Chance, diverticulosis and internal hemorrhoids, no polyps, repeat in 10 yrs    Social History   Occupational History   Not on file  Tobacco Use   Smoking status: Never   Smokeless tobacco: Never  Vaping Use   Vaping status: Never Used  Substance and Sexual Activity   Alcohol use: No    Alcohol/week: 0.0 standard drinks of alcohol   Drug use: No   Sexual activity: Not on file

## 2023-05-06 ENCOUNTER — Ambulatory Visit
Admission: RE | Admit: 2023-05-06 | Discharge: 2023-05-06 | Disposition: A | Discharge status: Home / Self Care | Payer: 59 | Source: Ambulatory Visit | Attending: Physician Assistant | Admitting: Physician Assistant

## 2023-05-06 DIAGNOSIS — M47816 Spondylosis without myelopathy or radiculopathy, lumbar region: Secondary | ICD-10-CM | POA: Diagnosis not present

## 2023-05-06 DIAGNOSIS — M48061 Spinal stenosis, lumbar region without neurogenic claudication: Secondary | ICD-10-CM | POA: Diagnosis not present

## 2023-05-06 DIAGNOSIS — M5126 Other intervertebral disc displacement, lumbar region: Secondary | ICD-10-CM | POA: Diagnosis not present

## 2023-05-06 DIAGNOSIS — M5442 Lumbago with sciatica, left side: Secondary | ICD-10-CM

## 2023-05-15 ENCOUNTER — Other Ambulatory Visit: Payer: Self-pay

## 2023-05-15 DIAGNOSIS — M5442 Lumbago with sciatica, left side: Secondary | ICD-10-CM

## 2023-05-15 NOTE — Progress Notes (Signed)
F/u with megan williams to discuss results and treatment

## 2023-05-16 ENCOUNTER — Encounter: Payer: Self-pay | Admitting: Physical Medicine and Rehabilitation

## 2023-05-16 ENCOUNTER — Ambulatory Visit: Payer: 59 | Admitting: Orthopaedic Surgery

## 2023-05-16 ENCOUNTER — Ambulatory Visit: Payer: 59 | Admitting: Physical Medicine and Rehabilitation

## 2023-05-16 DIAGNOSIS — M25552 Pain in left hip: Secondary | ICD-10-CM | POA: Diagnosis not present

## 2023-05-16 DIAGNOSIS — M7918 Myalgia, other site: Secondary | ICD-10-CM

## 2023-05-16 MED ORDER — MELOXICAM 15 MG PO TABS: 15.0000 mg | ORAL_TABLET | Freq: Every day | ORAL | 0 refills | Status: AC

## 2023-05-16 NOTE — Progress Notes (Signed)
Functional Pain Scale - descriptive words and definitions  Unmanageable (7)  Pain interferes with normal ADL's/nothing seems to help/sleep is very difficult/active distractions are very difficult to concentrate on. Severe range order  Average Pain  varies  Pain in left hip. MRI review

## 2023-05-16 NOTE — Progress Notes (Signed)
Darrell Baldwin - 77 y.o. male MRN [id number]  Date of birth: [birth year]  Office Visit Note: Visit Date: 05/16/2023 PCP: [name], MD Referred by: [name], MD  Subjective: Chief Complaint  Patient presents with   Left Hip - Pain   HPI: Darrell Baldwin is a 77 y.o. male who comes in today per the request of Dr. Glee Arvin for evaluation of chronic left sided lateral hip pain. He reports localized area of tenderness to left lateral hip. Pain ongoing for 4 months, states pain started while he was walking. His pain worsens with prolonged sitting and laying flat. He describes pain as sharp sensation, currently rates as 5 out of 10. Some relief of pain with home exercise regimen, rest and use of medications. No history of formal physical therapy. States his pain has gradually improved over the last several months. Recent lumbar MRI imaging exhibits severe bilateral facet arthropathy at L5-S1 resulting in moderate spinal canal stenosis with compression of the traversing bilateral S1 nerve roots, moderate spinal canal stenosis and severe bilateral neural foraminal narrowing at L4-L5. No history of lumbar surgery/injections. Patient denies focal weakness, numbness and tingling. No recent trauma or falls.    Review of Systems  Musculoskeletal:  Positive for joint pain.  Neurological:  Negative for tingling, sensory change, focal weakness and weakness.  All other systems reviewed and are negative.  Otherwise per HPI.  Assessment & Plan: Visit Diagnoses:    ICD-10-CM   1. Lateral pain of left hip  M25.552 Ambulatory referral to Physical Therapy    2. Myofascial pain syndrome  M79.18 Ambulatory referral to Physical Therapy       Plan: Findings:  Chronic left sided lateral hip pain. His pain has gradually improved over the last several months. He continues to conservative therapies and home exercise regimen. Patients clinical presentation and exam are complex. His symptoms do not  fit with classic presentation of lumbar spine or hip issue. No pain down the legs, no groin pain. His symptoms do not fit with classic claudication as he denies leg pain and continues to walk several miles a week. There is localized area to left lateral hip that is tender upon palpation today. Could be myofascial pain or a type of tendonitis. I discussed treatment plan with patient today in detail, next step is to place order for short course of formal physical therapy. I also prescribed short course of Meloxicam. Should his symptoms declare themselves as claudication we would consider performing lumbar epidural steroid injection as there is moderate spinal canal stenosis at L4-L5 and L5-S1. Patient can follow up with [place] as needed if his pain persists. No red flag symptoms noted upon exam today.     Meds & Orders:  Meds ordered this encounter  Medications   meloxicam (MOBIC) 15 MG tablet    Sig: Take 1 tablet (15 mg total) by mouth daily.    Dispense:  30 tablet    Refill:  0    Orders Placed This Encounter  Procedures   Ambulatory referral to Physical Therapy    Follow-up: Return if symptoms worsen or fail to improve.   Procedures: No procedures performed      Clinical History: CLINICAL DATA:  Low back pain, symptoms persist with > 6 wks treatment. Acute left-sided sciatica.   EXAM: MRI LUMBAR SPINE WITHOUT CONTRAST   TECHNIQUE: Multiplanar, multisequence MR imaging of the lumbar spine was performed. No intravenous contrast was administered.   COMPARISON:  Lumbar spine MRI 11/04/2014.   FINDINGS: Segmentation: Conventional numbering is assumed with 5 non-rib-bearing, lumbar type vertebral bodies.   Alignment:  Unchanged grade 1 anterolisthesis of L4 on L5.   Vertebrae: No fracture, evidence of discitis, or suspicious bone lesion.   Conus medullaris and cauda equina: Conus extends to the L1 level. Conus and cauda equina appear normal.   Paraspinal and other soft tissues:  Unremarkable.   Disc levels:   T12-L1:  Normal.   L1-L2:  Normal.   L2-L3:  Normal.   L3-L4: Disc bulge and facet arthropathy results in moderate bilateral neural foraminal narrowing.   L4-L5: Anterolisthesis with uncovered disc and severe bilateral facet arthropathy results in moderate spinal canal stenosis and severe bilateral neural foraminal narrowing, slightly worse from prior.   L5-S1: Increased disc bulge and severe bilateral facet arthropathy results in moderate spinal canal stenosis with compression of the traversing bilateral S1 nerve roots in the lateral recesses, and severe bilateral neural foraminal narrowing, all worse from prior.   IMPRESSION: 1. 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 2. Slightly worsened moderate spinal canal stenosis and severe bilateral neural foraminal narrowing at L4-L5.     Electronically Signed   By: Orvan Falconer M.D.   On: 05/15/2023 15:48   He reports that he has never smoked. He has never used smokeless tobacco. No results for input(s): "HGBA1C", "LABURIC" in the last 8760 hours.  Objective:  VS:  HT:    WT:   BMI:     BP:   HR: bpm  TEMP: ( )  RESP:  Physical Exam Vitals and nursing note reviewed.  HENT:     Head: Normocephalic and atraumatic.     Right Ear: External ear normal.     Left Ear: External ear normal.     Nose: Nose normal.     Mouth/Throat:     Mouth: Mucous membranes are moist.  Eyes:     Extraocular Movements: Extraocular movements intact.  Cardiovascular:     Rate and Rhythm: Normal rate.     Pulses: Normal pulses.  Pulmonary:     Effort: Pulmonary effort is normal.  Abdominal:     General: Abdomen is flat. There is no distension.  Musculoskeletal:        General:  Tenderness present.     Cervical back: Normal range of motion.     Comments: Patient rises from seated position to standing without difficulty. Good lumbar range of motion. No pain noted with facet loading. 5/5 strength noted with bilateral hip flexion, knee flexion/extension, ankle dorsiflexion/plantarflexion and EHL. No clonus noted bilaterally. No pain upon palpation of greater trochanters. No pain with internal/external rotation of bilateral hips. Sensation intact bilaterally. Tenderness noted to localized area of left lateral hip. Negative slump test bilaterally. Ambulates without aid, gait steady.     Skin:    General: Skin is warm and dry.     Capillary Refill: Capillary refill takes less than 2 seconds.  Neurological:     General: No focal deficit present.     Mental Status: He is alert and oriented to person, place, and time.  Psychiatric:        Mood and Affect: Mood normal.        Behavior: Behavior normal.     Ortho Exam  Imaging: No results found.  Past Medical/Family/Surgical/Social History: Medications & Allergies reviewed per EMR, new medications updated. 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   Statin intolerance 08/13/2019   Pain in left finger(s) 09/18/2018   Dyslipidemia 09/13/2018   OSA (obstructive sleep apnea) 10/17/2017   Seasonal allergies 05/29/2017   Insomnia 10/25/2016   Chronic neck pain 08/04/2016   Low back pain with left-sided sciatica 12/24/2014   Erectile dysfunction 08/15/2013   Environmental allergies 04/25/2013   HTN (hypertension) 04/02/2013   Past Medical History:  Diagnosis Date   Allergic rhinitis    Hypertension    Family History  Problem Relation Age of Onset   Breast cancer Sister    Diabetes Sister    Lung cancer Brother    Liver cancer Sister    Diabetes Sister    Colon cancer Neg Hx    Past  Surgical History:  Procedure Laterality Date   COLONOSCOPY  04/11/2014   per Dr. Juanda Chance, diverticulosis and internal hemorrhoids, no polyps, repeat in 10 yrs    Social History   Occupational History   Not on file  Tobacco Use   Smoking status: Never   Smokeless tobacco: Never  Vaping Use   Vaping status: Never Used  Substance and Sexual Activity   Alcohol use: No    Alcohol/week: 0.0 standard drinks of alcohol   Drug use: No   Sexual activity: Not on file

## 2023-05-31 ENCOUNTER — Ambulatory Visit: Payer: 59 | Attending: Physical Medicine and Rehabilitation

## 2023-07-07 ENCOUNTER — Ambulatory Visit (INDEPENDENT_AMBULATORY_CARE_PROVIDER_SITE_OTHER): Payer: 59

## 2023-07-07 DIAGNOSIS — Z23 Encounter for immunization: Secondary | ICD-10-CM

## 2023-07-12 ENCOUNTER — Other Ambulatory Visit: Payer: Self-pay

## 2023-07-12 ENCOUNTER — Telehealth: Payer: Self-pay

## 2023-07-12 MED ORDER — ALLOPURINOL 300 MG PO TABS: 300.0000 mg | ORAL_TABLET | Freq: Every day | ORAL | 3 refills | Status: DC

## 2023-07-12 NOTE — Telephone Encounter (Signed)
Done and pt notified. 

## 2023-07-12 NOTE — Telephone Encounter (Signed)
Copied from CRM #[id number]. Topic: Clinical - Medication Refill >> Jul 12, 2023  9:43 AM Clayton Bibles wrote: Most Recent Primary Care Visit:  Provider: Vickii Chafe  Department: [facility]  Visit Type: NURSE VISIT  Date: 07/07/2023  Medication: He needs his medication for Gout. He did not have the name of medication.   Has the patient contacted their pharmacy? No (Agent: If no, request that the patient contact the pharmacy for the refill. If patient does not wish to contact the pharmacy document the reason why and proceed with request.) (Agent: If yes, when and what did the pharmacy advise?)  Is this the correct pharmacy for this prescription? Yes If no, delete pharmacy and type the correct one.  This is the patient's preferred pharmacy:  [id number] - [place], [place] - [address] AT [facility] [address]  [place] [address] Phone: [phone] Fax: [phone]   Has the prescription been filled recently? No  Is the patient out of the medication? Yes  Has the patient been seen for an appointment in the last year OR does the patient have an upcoming appointment? Yes  Can we respond through MyChart? No  Agent: Please be advised that Rx refills may take up to 3 business days. We ask that you follow-up with your pharmacy.

## 2023-08-08 ENCOUNTER — Telehealth: Payer: Self-pay | Admitting: Family Medicine

## 2023-08-08 MED ORDER — METHYLPREDNISOLONE 4 MG PO TBPK: ORAL_TABLET | ORAL | 0 refills | Status: DC

## 2023-08-08 NOTE — Telephone Encounter (Signed)
 He has gout in the right foot

## 2023-08-22 ENCOUNTER — Telehealth: Payer: Self-pay | Admitting: Family Medicine

## 2023-08-22 NOTE — Telephone Encounter (Signed)
Copied from CRM [id number]. Topic: General - Call Back - No Documentation >> Aug 22, 2023  9:50 AM Samuel Jester B wrote: Reason for CRM: Pt stated that he received a call and would like for someone to give him a callback.

## 2023-08-24 NOTE — Telephone Encounter (Signed)
Returned pt call but mailbox is full no option to leave a message. Will try later

## 2023-08-28 NOTE — Telephone Encounter (Signed)
No success speaking with pt

## 2023-09-11 ENCOUNTER — Ambulatory Visit: Payer: Self-pay | Admitting: Family Medicine

## 2023-09-11 NOTE — Telephone Encounter (Signed)
 Copied from CRM [id number]. Topic: Clinical - Red Word Triage >> Sep 11, 2023  9:34 AM Thomes Dinning wrote: Red Word that prompted transfer to Nurse Triage: Patient has swelling and pain in his right foot. It's been going on for about a month or so. Patient's spouse believed it was gout   Chief Complaint: Right foot pain/swelling Symptoms: Right foot pain/swelling Frequency: x 1 month Pertinent Negatives: Patient denies no injuries, fever, abdominal pain, difficulty breathing Disposition: [] ED /[] Urgent Care (no appt availability in office) / [x] Appointment(In office/virtual)/ []  [place] [facility]/ [] Home Care/ [] Refused Recommended Disposition /[] [place] [facility]/ []  Follow-up with PCP Additional Notes: Patient's wife called and advised that her husband (the patient) has had right foot swelling and pain for a month that is similar to previous episodes of gout that he has been seen for and has prescriptions for. Patient had this happen last year and patient was told it was gout.  He has taken medication for this and had flare ups in the past couple of months that he has been seen for. This current episode of right foot pain/swelling has been going on for about a  month. Wife states that he has not had any injuries that she is aware of and she states that they have all the provided information about what can trigger gout and she states that they don't really eat a lot of foods that should flare it up that she is aware of. Appointment made for tomorrow 09/12/2023 at 10:15 am with patient's PCP Dr [name]. Patient's wife is advised that if anything gets worse to take patient to the emergency room.  She verbalized understanding.   Reason for Disposition  [1] Swollen foot AND [2] no fever  (Exceptions: localized bump from bunions, calluses, insect bite, sting)  Answer Assessment - Initial Assessment Questions 1. ONSET: "When did the pain start?"      A month ago 2. LOCATION: "Where is the  pain located?"      Right foot 3. PAIN: "How bad is the pain?"    (Scale 1-10; or mild, moderate, severe)  - MILD (1-3): doesn't interfere with normal activities.   - MODERATE (4-7): interferes with normal activities (e.g., work or school) or awakens from sleep, limping.   - SEVERE (8-10): excruciating pain, unable to do any normal activities, unable to walk.      Moderate 4. WORK OR EXERCISE: "Has there been any recent work or exercise that involved this part of the body?"      walking 5. CAUSE: "What do you think is causing the foot pain?"     Possibly gout 6. OTHER SYMPTOMS: "Do you have any other symptoms?" (e.g., leg pain, rash, fever, numbness)     N/a  Protocols used: Foot Pain-A-AH

## 2023-09-12 ENCOUNTER — Ambulatory Visit (INDEPENDENT_AMBULATORY_CARE_PROVIDER_SITE_OTHER): Payer: 59 | Admitting: Family Medicine

## 2023-09-12 ENCOUNTER — Encounter: Payer: Self-pay | Admitting: Family Medicine

## 2023-09-12 ENCOUNTER — Ambulatory Visit: Payer: 59

## 2023-09-12 VITALS — BP 112/76 | HR 75 | Temp 98.1°F | Wt 160.4 lb

## 2023-09-12 DIAGNOSIS — M79674 Pain in right toe(s): Secondary | ICD-10-CM | POA: Diagnosis not present

## 2023-09-12 DIAGNOSIS — S99921D Unspecified injury of right foot, subsequent encounter: Secondary | ICD-10-CM | POA: Diagnosis not present

## 2023-09-12 MED ORDER — METHYLPREDNISOLONE 4 MG PO TBPK: ORAL_TABLET | ORAL | 0 refills | Status: DC

## 2023-09-12 NOTE — Progress Notes (Signed)
   Subjective:    Patient ID: Darrell Baldwin, male    DOB: 05-26-1946, 78 y.o.   MRN: [id number]  HPI Here for swelling and pain in the right great toe after he stubbed the toe on a door frame 2 weeks ago at home.    Review of Systems  Constitutional: Negative.   Respiratory: Negative.    Cardiovascular: Negative.   Musculoskeletal:  Positive for arthralgias.       Objective:   Physical Exam Constitutional:      Appearance: Normal appearance.  Cardiovascular:     Rate and Rhythm: Normal rate and regular rhythm.     Pulses: Normal pulses.     Heart sounds: Normal heart sounds.  Pulmonary:     Effort: Pulmonary effort is normal.     Breath sounds: Normal breath sounds.  Musculoskeletal:     Comments: The MTP joint of the right great toe is warm, swollen, and very tender   Neurological:     Mental Status: He is alert.           Assessment & Plan:  He has an acute case of gout in the toe after trauma. We will Xray the toe to rule out any fracture. Treat the gout with a Medrol dose pack.  [name], MD

## 2023-09-21 ENCOUNTER — Other Ambulatory Visit: Payer: Self-pay | Admitting: Family Medicine

## 2023-09-21 ENCOUNTER — Other Ambulatory Visit: Payer: Self-pay | Admitting: Physical Medicine and Rehabilitation

## 2023-09-21 ENCOUNTER — Telehealth: Payer: Self-pay | Admitting: *Deleted

## 2023-09-21 NOTE — Telephone Encounter (Signed)
 Copied from CRM [id number]. Topic: Clinical - Lab/Test Results >> Sep 21, 2023  3:16 PM Corin V wrote: Reason for CRM: Patient's wife is calling and would like to receive a call back as soon as possible to go over Xray results from 09/12/23.

## 2023-09-21 NOTE — Telephone Encounter (Signed)
 Copied from CRM [id number]. Topic: Clinical - Medication Refill >> Sep 21, 2023  3:14 PM Corin V wrote: Most Recent Primary Care Visit:  Provider: [name] A  Department: [facility]  Visit Type: ACUTE  Date: 09/12/2023  Medication: methylPREDNISolone (MEDROL DOSEPAK) 4 MG TBPK tablet  Has the patient contacted their pharmacy? Yes (Agent: If no, request that the patient contact the pharmacy for the refill. If patient does not wish to contact the pharmacy document the reason why and proceed with request.) (Agent: If yes, when and what did the pharmacy advise?)  Is this the correct pharmacy for this prescription? Yes If no, delete pharmacy and type the correct one.  This is the patient's preferred pharmacy:  [id number] - [place], [place] - [address] AT [facility] [address] [place] [address] Phone: [phone] Fax: [phone]   Has the prescription been filled recently? No  Is the patient out of the medication? No  Has the patient been seen for an appointment in the last year OR does the patient have an upcoming appointment? Yes  Can we respond through MyChart? No  Agent: Please be advised that Rx refills may take up to 3 business days. We ask that you follow-up with your pharmacy.

## 2023-09-25 MED ORDER — METHYLPREDNISOLONE 4 MG PO TBPK: ORAL_TABLET | ORAL | 0 refills | Status: DC

## 2023-09-25 NOTE — Telephone Encounter (Signed)
 Spoke with pt spouse advised that x-ray results have not been resulted, advised that the office will call pt when Dr Clent Ridges received report

## 2023-10-09 ENCOUNTER — Telehealth: Payer: Self-pay

## 2023-10-09 NOTE — Telephone Encounter (Signed)
 Copied from CRM [id number]. Topic: Clinical - Request for Lab/Test Order >> Oct 09, 2023  4:39 PM Darrell Baldwin wrote: Reason for CRM: Patient is requesting an EKG. 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

## 2023-10-10 ENCOUNTER — Ambulatory Visit (INDEPENDENT_AMBULATORY_CARE_PROVIDER_SITE_OTHER): Admitting: Family Medicine

## 2023-10-10 ENCOUNTER — Encounter: Payer: Self-pay | Admitting: Family Medicine

## 2023-10-10 VITALS — BP 118/62 | HR 73 | Temp 97.9°F | Ht 67.0 in | Wt 162.0 lb

## 2023-10-10 DIAGNOSIS — I499 Cardiac arrhythmia, unspecified: Secondary | ICD-10-CM

## 2023-10-10 NOTE — Progress Notes (Signed)
   Subjective:    Patient ID: Darrell Baldwin, male    DOB: 1945/11/01, 78 y.o.   MRN: [id number]  HPI Here at the request of the [employer]. 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 They told him to see his PCP for an EKG to determine what type of rhythm this is. He feels fine, no palpitations or chest pain or SOB. We have never heard an irregular rhythm herein our office before today.    Review of Systems  Constitutional: Negative.   Respiratory: Negative.    Cardiovascular: Negative.        Objective:   Physical Exam Constitutional:      Appearance: Normal appearance.  Cardiovascular:     Rate and Rhythm: Normal rate. Rhythm irregular.     Pulses: Normal pulses.     Heart sounds: Normal heart sounds.     Comments: EKG today shows normal sinus rhythm Pulmonary:     Effort: Pulmonary effort is normal.     Breath sounds: Normal breath sounds.  Neurological:     Mental Status: He is alert.           Assessment & Plan:  His underlying heart rhythm is sinus, but I assume he has some occasional ectopy (likely PVC's or PAC's). We printed out a copy of today's EKG for him to take back to the [employer]. We will defer any further work up at this time.  [name], MD

## 2023-10-10 NOTE — Telephone Encounter (Signed)
 Closing encounter. Patient was seen in office today and EKG was done.

## 2023-10-17 ENCOUNTER — Telehealth: Payer: Self-pay

## 2023-10-17 ENCOUNTER — Ambulatory Visit: Payer: Self-pay

## 2023-10-17 ENCOUNTER — Ambulatory Visit: Admitting: Family Medicine

## 2023-10-17 NOTE — Telephone Encounter (Signed)
 Spoke with pt spouse advised to contact [facility] for a f/unappointment

## 2023-10-17 NOTE — Telephone Encounter (Signed)
 Copied from CRM [id number]. Topic: Clinical - Red Word Triage >> Oct 17, 2023  8:43 AM Fonda Kinder J wrote: Red Word that prompted transfer to Nurse Triage: Severe pain in left side   Chief Complaint: Left Hip Pain  Symptoms: Pain, Limping  Frequency: Ongoing, Chronic  Pertinent Negatives: Patient denies fever  Disposition: [] ED /[] Urgent Care (no appt availability in office) / [x] Appointment(In office/virtual)/ []  [place] [facility]/ [] Home Care/ [] Refused Recommended Disposition /[] [place] [facility]/ []  Follow-up with PCP Additional Notes: CS is being triaged for pain. Spoke with wife for triage. The wife is requesting that the patient have another MRI.  The patient's wife states she notices a limp when he walks and it is not normal in appearance. The patient states he does not feel like he is limping, and the pain is localized to one area only, and exacerbated by walking.   In office appointment made for today.   Reason for Disposition  [1] SEVERE pain (e.g., excruciating, unable to do any normal activities) AND [2] not improved after 2 hours of pain medicine  Answer Assessment - Initial Assessment Questions 1. LOCATION and RADIATION: "Where is the pain located?"      Left Hip  2. QUALITY: "What does the pain feel like?"  (e.g., sharp, dull, aching, burning)     Stabbing pain when walking  3. SEVERITY: "How bad is the pain?" "What does it keep you from doing?"   (Scale 1-10; or mild, moderate, severe)   -  MILD (1-3): doesn't interfere with normal activities    -  MODERATE (4-7): interferes with normal activities (e.g., work or school) or awakens from sleep, limping    -  SEVERE (8-10): excruciating pain, unable to do any normal activities, unable to walk     8  4. ONSET: "When did the pain start?" "Does it come and go, or is it there all the time?"     Ongoing  5. WORK OR EXERCISE: "Has there been any recent work or exercise that involved this part of the body?"       No  6. CAUSE: "What do you think is causing the hip pain?"      Unsure  7. AGGRAVATING FACTORS: "What makes the hip pain worse?" (e.g., walking, climbing stairs, running)     Walking  8. OTHER SYMPTOMS: "Do you have any other symptoms?" (e.g., back pain, pain shooting down leg,  fever, rash)     No  Protocols used: Hip Pain-A-AH

## 2023-10-17 NOTE — Telephone Encounter (Signed)
 Spoke with pt spouse advised to call [facility] and schedule f/u appointment to review MRI results and to discuss next step since pt is still having pain, verbalized understanding

## 2023-10-17 NOTE — Telephone Encounter (Signed)
 Copied from CRM [id number]. Topic: Referral - Request for Referral >> Oct 17, 2023  8:29 AM Aletta Edouard wrote: Did the patient discuss referral with their provider in the last year? Yes (If No - schedule appointment) (If Yes - send message)  Appointment offered? Yes  Type of order/referral and detailed reason for visit: referral to a dr for the pain in his left side he is not feeling well still this has been going on 6 months wife is upset about this she needs a referral asap she doesn't know what dr he needs to be referred to but he needs to be referred how to someone  to look at his side   Preference of office, provider, location: unknown   If referral order, have you been seen by this specialty before? No (If Yes, this issue or another issue? When? Where?  Can we respond through MyChart? No     wife would like a call back today [phone] >> Oct 17, 2023  8:43 AM Fonda Kinder J wrote: Pts wife called in and said the call dropped, but she wanted to confirm her request had been received, I decided to warm transfer the pts wife to nurse triage

## 2023-10-23 ENCOUNTER — Ambulatory Visit: Payer: Self-pay

## 2023-10-23 NOTE — Telephone Encounter (Signed)
 Chief Complaint: Leg cramping in sleep Symptoms: bilateral leg cramping in sleep Frequency: Months Pertinent Negatives: Patient denies chest pain, difficulty breathing, leg swelling Disposition: [] ED /[] Urgent Care (no appt availability in office) / [x] Appointment(In office/virtual)/ []  [place] [facility]/ [] Home Care/ [] Refused Recommended Disposition /[] [place] [facility]/ []  Follow-up with PCP Additional Notes: Patient's wife called in and stated patient is having severe leg cramping in his legs while sleeping. Patient has soreness the next day from it. Patient's wife states she would like to get both her and patient in for labs and overall health and wellness check up to check metabolic panel and general health panel. Advised patient to begin drinking electrolyte supplement daily (sugar free) and to add in magnesium glycinate at bed time to help with muscle cramps until patient can be seen by provider. Advised patient's wife to call us back if symptoms worsen, patient develops swelling in legs, redness in legs, difficulty breathing or chest pain. Patient's wife verbalized understanding.    Copied from CRM [id number]. Topic: Clinical - Red Word Triage >> Oct 23, 2023  1:14 PM Melissa C wrote: [facility] that prompted transfer to Nurse Triage: patient has been having terrible cramps at night and then wakes up sore and in pain Reason for Disposition  [1] MILD pain (e.g., does not interfere with normal activities) AND [2] present > 7 days  Answer Assessment - Initial Assessment Questions 1. ONSET: "When did the pain start?"      A few months 2. LOCATION: "Where is the pain located?"      Both legs 3. PAIN: "How bad is the pain?"    (Scale 1-10; or mild, moderate, severe)   -  MILD (1-3): doesn't interfere with normal activities    -  MODERATE (4-7): interferes with normal activities (e.g., work or school) or awakens from sleep, limping    -  SEVERE (8-10): excruciating pain, unable to do  any normal activities, unable to walk     Severe cramping in legs while sleeping, getting worse 4. WORK OR EXERCISE: "Has there been any recent work or exercise that involved this part of the body?"      No 5. CAUSE: "What do you think is causing the leg pain?"     Patient uncertain if it's metabolic abnormality 6. OTHER SYMPTOMS: "Do you have any other symptoms?" (e.g., chest pain, back pain, breathing difficulty, swelling, rash, fever, numbness, weakness)     No  Protocols used: Leg Pain-A-AH

## 2023-10-24 ENCOUNTER — Ambulatory Visit: Admitting: Orthopaedic Surgery

## 2023-10-25 ENCOUNTER — Ambulatory Visit: Admitting: Family Medicine

## 2023-10-26 ENCOUNTER — Encounter: Payer: Self-pay | Admitting: Orthopaedic Surgery

## 2023-10-26 ENCOUNTER — Ambulatory Visit (INDEPENDENT_AMBULATORY_CARE_PROVIDER_SITE_OTHER): Admitting: Orthopaedic Surgery

## 2023-10-26 DIAGNOSIS — M25552 Pain in left hip: Secondary | ICD-10-CM

## 2023-10-26 MED ORDER — METHYLPREDNISOLONE ACETATE 40 MG/ML IJ SUSP
40.0000 mg | INTRAMUSCULAR | Status: AC | PRN
Start: 2023-10-26 — End: 2023-10-26
  Administered 2023-10-26: 40 mg via INTRA_ARTICULAR

## 2023-10-26 MED ORDER — BUPIVACAINE HCL 0.5 % IJ SOLN
3.0000 mL | INTRAMUSCULAR | Status: AC | PRN
Start: 2023-10-26 — End: 2023-10-26
  Administered 2023-10-26: 3 mL via INTRA_ARTICULAR

## 2023-10-26 MED ORDER — LIDOCAINE HCL 1 % IJ SOLN
3.0000 mL | INTRAMUSCULAR | Status: AC | PRN
Start: 2023-10-26 — End: 2023-10-26
  Administered 2023-10-26: 3 mL

## 2023-10-26 NOTE — Progress Notes (Signed)
 Office Visit Note   Patient: Darrell Baldwin           Date of Birth: 03-Mar-1946           MRN: [id number] Visit Date: 10/26/2023              Requested by: [name], MD [address] [place],  [place] [address] PCP: [name], MD   Assessment & Plan: Visit Diagnoses:  1. Pain in left hip     Plan: [name] with chronic left trochanteric hip pain.  We will try cortisone injection first as he does not want to do physical therapy at this time.  If his symptoms persist he is agreeable to outpatient PT.  Follow-Up Instructions: No follow-ups on file.   Orders:  Orders Placed This Encounter  Procedures   Large Joint Inj   No orders of the defined types were placed in this encounter.     Procedures: Large Joint Inj: L greater trochanter on 10/26/2023 2:26 PM Indications: pain Details: 22 G needle Medications: 3 mL lidocaine 1 %; 3 mL bupivacaine 0.5 %; 40 mg methylPREDNISolone acetate 40 MG/ML Outcome: tolerated well, no immediate complications Patient was prepped and draped in the usual sterile fashion.       Clinical Data: No additional findings.   Subjective: Chief Complaint  Patient presents with   Lower Back - Pain    HPI Darrell Baldwin is a 78 year old gentleman who comes in for follow-up evaluation of chronic left hip pain.  He did not do physical therapy.  He saw Aundra Millet about 6 months ago and she felt that his symptoms were not consistent with lumbar spine pathology.   Review of Systems  Constitutional: Negative.   HENT: Negative.    Eyes: Negative.   Respiratory: Negative.    Cardiovascular: Negative.   Gastrointestinal: Negative.   Endocrine: Negative.   Genitourinary: Negative.   Skin: Negative.   Allergic/Immunologic: Negative.   Neurological: Negative.   Hematological: Negative.   Psychiatric/Behavioral: Negative.    All other systems reviewed and are negative.    Objective: Vital Signs: There were no  vitals taken for this visit.  Physical Exam Vitals and nursing note reviewed.  Constitutional:      Appearance: He is well-developed.  Pulmonary:     Effort: Pulmonary effort is normal.  Abdominal:     Palpations: Abdomen is soft.  Skin:    General: Skin is warm.  Neurological:     Mental Status: He is alert and oriented to person, place, and time.  Psychiatric:        Behavior: Behavior normal.        Thought Content: Thought content normal.        Judgment: Judgment normal.     Ortho Exam Exam of the left hip shows tenderness to the trochanteric region more so in the superior posterior aspect.  Otherwise exam is normal. Specialty Comments:  CLINICAL DATA:  Low back pain, symptoms persist with > 6 wks treatment. Acute left-sided sciatica.   EXAM: MRI LUMBAR SPINE WITHOUT CONTRAST   TECHNIQUE: Multiplanar, multisequence MR imaging of the lumbar spine was performed. No intravenous contrast was administered.   COMPARISON:  Lumbar spine MRI 11/04/2014.   FINDINGS: Segmentation: Conventional numbering is assumed with 5 non-rib-bearing, lumbar type vertebral bodies.   Alignment:  Unchanged grade 1 anterolisthesis of L4 on L5.   Vertebrae: No fracture, evidence of discitis, or suspicious bone lesion.   Conus  medullaris and cauda equina: Conus extends to the L1 level. Conus and cauda equina appear normal.   Paraspinal and other soft tissues: Unremarkable.   Disc levels:   T12-L1:  Normal.   L1-L2:  Normal.   L2-L3:  Normal.   L3-L4: Disc bulge and facet arthropathy results in moderate bilateral neural foraminal narrowing.   L4-L5: Anterolisthesis with uncovered disc and severe bilateral facet arthropathy results in moderate spinal canal stenosis and severe bilateral neural foraminal narrowing, slightly worse from prior.   L5-S1: Increased disc bulge and severe bilateral facet arthropathy results in moderate spinal canal stenosis with compression of  the traversing bilateral S1 nerve roots in the lateral recesses, and severe bilateral neural foraminal narrowing, all worse from prior.   IMPRESSION: 1. 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 2. Slightly worsened moderate spinal canal stenosis and severe bilateral neural foraminal narrowing at L4-L5.     Electronically Signed   By: Orvan Falconer M.D.   On: 05/15/2023 15:48  Imaging: No results found.   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   Statin intolerance 08/13/2019   Pain in left finger(s) 09/18/2018   Dyslipidemia 09/13/2018   OSA (obstructive sleep apnea) 10/17/2017   Seasonal allergies 05/29/2017   Insomnia 10/25/2016   Chronic neck pain 08/04/2016   Low back pain with left-sided sciatica 12/24/2014   Erectile dysfunction 08/15/2013   Environmental allergies 04/25/2013   HTN (hypertension) 04/02/2013   Past Medical History:  Diagnosis Date   Allergic rhinitis    Hypertension     Family History  Problem Relation Age of Onset   Breast cancer Sister    Diabetes Sister    Lung cancer Brother    Liver cancer Sister    Diabetes Sister    Colon cancer Neg Hx     Past Surgical History:  Procedure Laterality Date   COLONOSCOPY  04/11/2014   per Dr. Juanda Chance, diverticulosis and internal hemorrhoids, no polyps, repeat in 10 yrs    Social History   Occupational History   Not on file  Tobacco Use   Smoking status: Never   Smokeless tobacco: Never  Vaping Use   Vaping status: Never Used  Substance and Sexual Activity   Alcohol use: No    Alcohol/week: 0.0 standard drinks of alcohol   Drug use: No    Sexual activity: Not on file

## 2023-10-30 ENCOUNTER — Ambulatory Visit: Admitting: Family Medicine

## 2023-11-16 ENCOUNTER — Ambulatory Visit

## 2023-11-16 ENCOUNTER — Ambulatory Visit (INDEPENDENT_AMBULATORY_CARE_PROVIDER_SITE_OTHER): Admitting: Family Medicine

## 2023-11-16 ENCOUNTER — Encounter: Payer: Self-pay | Admitting: Family Medicine

## 2023-11-16 VITALS — BP 124/80 | HR 53 | Temp 98.2°F | Wt 163.8 lb

## 2023-11-16 DIAGNOSIS — M160 Bilateral primary osteoarthritis of hip: Secondary | ICD-10-CM | POA: Diagnosis not present

## 2023-11-16 DIAGNOSIS — M25552 Pain in left hip: Secondary | ICD-10-CM

## 2023-11-16 DIAGNOSIS — G8929 Other chronic pain: Secondary | ICD-10-CM

## 2023-11-16 NOTE — Progress Notes (Signed)
   Subjective:    Patient ID: Darrell Baldwin, male    DOB: [birth year], 78 y.o.   MRN: [id number]  HPI Here for left hip pain that started about 6 months ago. No hx of trauma. He is taking Meloxicam  with little relief. He says the hip pain is worst when he is lying down. He has seen Dr. Dyana Glade for this, and on 10-26-23 he was given a steroid injection to the hip. This did not help his pain at all. He is not asking for a referral to the Sports Medicine provider that his wife sees, Dr. Janeece Mechanic. Of note, he had a lumbar spine MRI that showed severe facet arthropathy and severe bilateral foraminal narrowing at L5-S1. He has never felt any back pain however.   Review of Systems  Constitutional: Negative.   Respiratory: Negative.    Cardiovascular: Negative.   Musculoskeletal:  Positive for arthralgias. Negative for back pain.       Objective:   Physical Exam Constitutional:      Appearance: Normal appearance.     Comments: He is able to get up an down from the exam table easily   Cardiovascular:     Rate and Rhythm: Normal rate and regular rhythm.     Pulses: Normal pulses.     Heart sounds: Normal heart sounds.  Pulmonary:     Effort: Pulmonary effort is normal.     Breath sounds: Normal breath sounds.  Musculoskeletal:     Comments: He is not tender in the lower back at all, and ROM of the lower spine is full. The left hip is not tender. Both hips have full ROM   Neurological:     Mental Status: He is alert.           Assessment & Plan:  He has chronic left hip pain, and it is not clear if the pain originates from the hip or from the spine. We will get Xrays of the legt hip today, and we will refer him to Dr. Sharl Davies.  Corita Diego, MD

## 2023-11-24 ENCOUNTER — Encounter: Payer: Self-pay | Admitting: Physical Medicine & Rehabilitation

## 2023-11-28 ENCOUNTER — Encounter: Payer: Self-pay | Admitting: Physical Medicine & Rehabilitation

## 2023-11-28 ENCOUNTER — Encounter: Attending: Physical Medicine & Rehabilitation | Admitting: Physical Medicine & Rehabilitation

## 2023-11-28 VITALS — BP 171/87 | HR 61 | Ht 67.0 in | Wt 166.4 lb

## 2023-11-28 DIAGNOSIS — M533 Sacrococcygeal disorders, not elsewhere classified: Secondary | ICD-10-CM | POA: Insufficient documentation

## 2023-11-28 NOTE — Progress Notes (Signed)
 Subjective:    Patient ID: Darrell Baldwin, male    DOB: Nov 17, 1945, 78 y.o.   MRN: [id number]  HPI Chief complaint left-sided buttock pain History 78 year old male who [place] he heard a pop and had immediate onset of pain in the left buttock. Review of chart indicates that he had primary care visit for left buttock pain on 03/06/2023 and that his pain had been present for 1 month prior to visit.  During that visit he stated he was walking up a hill when the pain onset occurred.  He was diagnosed with left gluteal pain.  The patient was using ibuprofen  and topical creams which produced a partial improvement in symptoms.  He was referred to orthopedics on 04/11/2019 for and an MRI of the lumbar spine was ordered.  He was trialed on muscle relaxers and tramadol .  During the Ortho PA follow-up visit on 05/16/2023 the patient was complaining of left-sided lateral hip pain.  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  There was severe L4-L5 foraminal stenosis as well.  Referral to physical therapy was made but the patient did not go to therapy. He has no numbness or tingling in the left lower extremity no weakness.  He still feels like his walking tolerance is reduced . Pain Inventory Average Pain 8 Pain Right Now 8 My pain is sharp  In the last 24 hours, has pain interfered with the following? General activity 6 Relation with others 6 Enjoyment of life 0 What TIME of day is your pain at its worst? morning , daytime, evening, and night Sleep (in general) NA  Pain is worse with: walking, sitting, and standing Pain improves with: rest and heat/ice Relief from Meds:  no med  walk without assistance do you drive?  yes  retired  trouble walking  Any changes since last visit?  no  Primary care Corita Diego    Family  History  Problem Relation Age of Onset   Breast cancer Sister    Diabetes Sister    Lung cancer Brother    Liver cancer Sister    Diabetes Sister    Colon cancer Neg Hx    Social History   Socioeconomic History   Marital status: Married    Spouse name: Not on file   Number of children: Not on file   Years of education: Not on file   Highest education level: Not on file  Occupational History   Not on file  Tobacco Use   Smoking status: Never   Smokeless tobacco: Never  Vaping Use   Vaping status: Never Used  Substance and Sexual Activity   Alcohol use: No    Alcohol/week: 0.0 standard drinks of alcohol   Drug use: No   Sexual activity: Not on file  Other Topics Concern   Not on file  Social History Narrative   Not on file   Social Drivers of Health   Financial Resource Strain: Low Risk  (08/15/2022)   Overall Financial Resource Strain (CARDIA)    Difficulty of Paying Living Expenses: Not hard at all  Food Insecurity: No Food Insecurity (08/15/2022)   Hunger Vital Sign    Worried About Running Out of Food in the Last Year: Never true    Ran Out of Food in the Last Year: Never true  Transportation Needs: No Transportation Needs (08/15/2022)   PRAPARE - Administrator, Civil Service (Medical): No    Lack of Transportation (Non-Medical): No  Physical Activity: Sufficiently Active (08/15/2022)   Exercise Vital Sign    Days of Exercise per Week: 7 days    Minutes of Exercise per Session: 30 min  Stress: No Stress Concern Present (08/15/2022)   [employer] of Occupational Health - Occupational Stress Questionnaire    Feeling of Stress : Not at all  Social Connections: Socially Integrated (08/15/2022)   Social Connection and Isolation Panel [NHANES]    Frequency of Communication with Friends and Family: More than three times a week    Frequency of Social Gatherings with Friends and Family: More than three times a week    Attends Religious Services: More  than 4 times per year    Active Member of Clubs or Organizations: Yes    Attends Banker Meetings: 1 to 4 times per year    Marital Status: Married   Past Surgical History:  Procedure Laterality Date   COLONOSCOPY  04/11/2014   per Dr. Grandville Lax, diverticulosis and internal hemorrhoids, no polyps, repeat in 10 yrs    Past Medical History:  Diagnosis Date   Allergic rhinitis    Hypertension    BP (!) 171/87   Pulse 61   Ht 5\' 7"  (1.702 m)   Wt 166 lb 6.4 oz (75.5 kg)   SpO2 98%   BMI 26.06 kg/m   Opioid Risk Score:   Fall Risk Score:  `1  Depression screen [facility] 2/9     11/28/2023    2:38 PM 09/12/2023   11:04 AM 04/04/2023    3:45 PM 03/06/2023    3:34 PM 08/15/2022    3:34 PM 08/03/2022   11:13 AM 04/04/2022   10:25 AM  Depression screen PHQ 2/9  Decreased Interest 0 0 0 0 0 0 3  Down, Depressed, Hopeless 0 0 0 0 0 0 0  PHQ - 2 Score 0 0 0 0 0 0 3  Altered sleeping 0 0 0 0 0 0 0  Tired, decreased energy 0 0 0 0 0 0 0  Change in appetite 0 0 0 0 0 0 0  Feeling bad or failure about yourself  0 0 0 0 0 0 0  Trouble concentrating 0 0 0 0 0 0 0  Moving slowly or fidgety/restless 0 0 0 0 0 0 0  Suicidal thoughts 0 0 0 0 0 0 0  PHQ-9 Score 0 0 0 0 0 0 3  Difficult doing work/chores  Not difficult at all Not difficult at all Not difficult at all Not difficult at all Not difficult at all Not difficult at all    Review of Systems  Musculoskeletal:  Positive for gait problem.       Left hip pain  All other systems reviewed and are negative.      Objective:   Physical Exam Constitutional:      Appearance: Normal appearance.  HENT:     Head: Normocephalic and atraumatic.  Musculoskeletal:     Comments: Hip knee and ankle range of motion is within normal limits   Sacral thrust (prone) : Positive left Lateral compression: Negative FABER's: Positive left SI Distraction (supine): Negative Thigh thrust test: Negative  Neurological:     Mental Status: He is  alert and oriented to person, place, and time.     Sensory: No sensory deficit.  Motor: No weakness.     Gait: Gait normal.     Comments: Motor strength is 5/5 bilateral hip flexor knee extensor ankle dorsiflexor Negative straight leg raising bilaterally Ambulates without assistive device no evidence of toe drag or knee instability he does have forward flexed posture however  Psychiatric:        Mood and Affect: Mood normal.        Behavior: Behavior normal.   Proximal gluteal tenderness on the left side        Assessment & Plan:  1.  Left buttock pain he has 2 positive provocative maneuvers suggesting sacroiliac disorder.  Imaging studies (images reviewed)  reveal impressive facet arthropathy bilateral L 4 5, L5-S1.  In addition patient likely has a muscular component to his pain as well. Send physical therapy and encouraged him to follow through on this.  Will see him back in a month. Will reassess, consider sacroiliac injections versus medial branch blocks on the left side Discussed with patient and wife went over spine model with the patient and wife.

## 2023-12-01 ENCOUNTER — Other Ambulatory Visit: Payer: Self-pay | Admitting: Family Medicine

## 2023-12-01 MED ORDER — IBUPROFEN 800 MG PO TABS: 800.0000 mg | ORAL_TABLET | Freq: Four times a day (QID) | ORAL | 1 refills | Status: AC | PRN

## 2023-12-01 NOTE — Telephone Encounter (Signed)
 Copied from CRM [id number]. Topic: Clinical - Medication Refill >> Dec 01, 2023 11:22 AM Jenice Mitts wrote: Medication: ibuprofen  (ADVIL ) 800 MG tablet  Has the patient contacted their pharmacy? Yes (Agent: If no, request that the patient contact the pharmacy for the refill. If patient does not wish to contact the pharmacy document the reason why and proceed with request.) (Agent: If yes, when and what did the pharmacy advise?)  This is the patient's preferred pharmacy:  [id number] - [place], [place] - [address] AT [facility] [address] [place] [address] Phone: [phone] Fax: [phone]  Is this the correct pharmacy for this prescription? Yes If no, delete pharmacy and type the correct one.   Has the prescription been filled recently? No  Is the patient out of the medication? Yes  Has the patient been seen for an appointment in the last year OR does the patient have an upcoming appointment? Yes  Can we respond through MyChart? Yes  Agent: Please be advised that Rx refills may take up to 3 business days. We ask that you follow-up with your pharmacy.

## 2023-12-13 ENCOUNTER — Ambulatory Visit: Attending: Physical Medicine & Rehabilitation

## 2023-12-13 ENCOUNTER — Other Ambulatory Visit: Payer: Self-pay

## 2023-12-13 DIAGNOSIS — M6281 Muscle weakness (generalized): Secondary | ICD-10-CM | POA: Insufficient documentation

## 2023-12-13 DIAGNOSIS — R2689 Other abnormalities of gait and mobility: Secondary | ICD-10-CM | POA: Diagnosis not present

## 2023-12-13 DIAGNOSIS — M533 Sacrococcygeal disorders, not elsewhere classified: Secondary | ICD-10-CM | POA: Diagnosis not present

## 2023-12-13 NOTE — Therapy (Signed)
 OUTPATIENT PHYSICAL THERAPY THORACOLUMBAR EVALUATION   Patient Name: Darrell Baldwin MRN: [id number] DOB:[birth year], 78 y.o., male Today's Date: 12/13/2023  END OF SESSION:  PT End of Session - 12/13/23 1620     Visit Number 1    Number of Visits 8    Date for PT Re-Evaluation 02/12/24    Authorization Type [employer]    PT Start Time 1615    PT Stop Time 1700    PT Time Calculation (min) 45 min    Activity Tolerance Patient tolerated treatment well    Behavior During Therapy [facility] for tasks assessed/performed             Past Medical History:  Diagnosis Date   Allergic rhinitis    Hypertension    Past Surgical History:  Procedure Laterality Date   COLONOSCOPY  04/11/2014   per Dr. 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   Statin intolerance 08/13/2019   Pain in left finger(s) 09/18/2018   Dyslipidemia 09/13/2018   OSA (obstructive sleep apnea) 10/17/2017   Seasonal allergies 05/29/2017   Insomnia 10/25/2016   Chronic neck pain 08/04/2016   Low back pain with left-sided sciatica 12/24/2014   Erectile dysfunction 08/15/2013   Environmental allergies 04/25/2013   HTN (hypertension) 04/02/2013    PCP: Donley Furth, MD   REFERRING PROVIDER: Genetta Kenning, MD  REFERRING DIAG: M53.3 (ICD-10-CM) - Sacroiliac dysfunction  Rationale for Evaluation and Treatment: Rehabilitation  THERAPY DIAG:  Sacroiliac joint dysfunction of left side - Plan: PT plan of care cert/re-cert  Muscle weakness (generalized) - Plan: PT plan of care cert/re-cert  Other abnormalities of gait and mobility - Plan: PT plan of care cert/re-cert  ONSET DATE: 06/18/23  SUBJECTIVE:                                                                                                                                                                                            SUBJECTIVE STATEMENT: Chief complaint left-sided buttock pain History 78 year old male who states that [place] when he heard a pop and had immediate onset of pain in the left buttock. Review of chart indicates that he had primary care visit for left buttock pain on 03/06/2023 and that his pain had been present for 1 month prior to visit.  During that visit he stated he was walking up a  hill when the pain onset occurred.  He was diagnosed with left gluteal pain.  The patient was using ibuprofen  and topical creams which produced a partial improvement in symptoms.  He was referred to orthopedics on 04/11/2019 for and an MRI of the lumbar spine was ordered.  He was trialed on muscle relaxers and tramadol .  During the Ortho PA follow-up visit on 05/16/2023 the patient was complaining of left-sided lateral hip pain.  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  There was severe L4-L5 foraminal stenosis as well.  Referral to physical therapy was made but the patient did not go to therapy. He has no numbness or tingling in the left lower extremity no weakness.  He still feels like his walking tolerance is reduced .  PERTINENT HISTORY:  Left buttock pain he has 2 positive provocative maneuvers suggesting sacroiliac disorder.  Imaging studies (images reviewed)  reveal impressive facet arthropathy bilateral L 4 5, L5-S1.  In addition patient likely has a muscular component to his pain as well. Send physical therapy and encouraged him to follow through on this.  Will see him back in a month. Will reassess, consider sacroiliac injections versus medial branch blocks on the left side Discussed with patient and wife went over spine model with the patient and wife.  PAIN:  Are you having pain? Yes:  NPRS scale: 9/10 Pain location: L SI region Pain description: sharp Aggravating factors: walking, prolonged standing/sitting Relieving factors: rest  PRECAUTIONS: None  RED FLAGS: None   WEIGHT BEARING RESTRICTIONS: No  FALLS:  Has patient fallen in last 6 months? No   OCCUPATION: retired  PLOF: Independent  PATIENT GOALS: To manage my low back/hip pain  NEXT MD VISIT: 12/26/23  OBJECTIVE:  Note: Objective measures were completed at Evaluation unless otherwise noted.  DIAGNOSTIC FINDINGS:  FINDINGS: Pelvic ring is intact. No acute fracture or dislocation is noted. Mild degenerative changes of the hip joints are noted bilaterally. No soft tissue changes are seen.   IMPRESSION: Degenerative change without acute abnormality.     Electronically Signed   By: Violeta Grey M.D.   On: 11/25/2023 21:18  FINDINGS: Pelvic ring is intact. No acute fracture or dislocation is noted. Mild degenerative changes of the hip joints are noted bilaterally. No soft tissue changes are seen.   IMPRESSION: Degenerative change without acute abnormality.     Electronically Signed   By: Violeta Grey M.D.   On: 11/25/2023 21:18  PATIENT SURVEYS:  LEFS 20/80 25% functional  MUSCLE LENGTH: Hamstrings: Right 80 deg; Left 80 deg Thomas test: Right mild restriction and L SI discomfort  POSTURE: flexed trunk   PALPATION: TTP L piriformis  LUMBAR ROM:   AROM eval  Flexion   Extension   Right lateral flexion   Left lateral flexion   Right rotation   Left rotation    (Blank rows = not tested)  LOWER EXTREMITY ROM:   WFL  Active  Right eval Left eval  Hip flexion    Hip extension    Hip abduction    Hip adduction    Hip internal rotation    Hip external rotation    Knee flexion    Knee extension    Ankle dorsiflexion    Ankle plantarflexion    Ankle inversion    Ankle eversion     (Blank rows = not tested)  LOWER EXTREMITY MMT:  MMT Right eval Left eval   Hip flexion    Hip extension    Hip abduction    Hip adduction    Hip internal rotation    Hip external rotation    Knee flexion    Knee extension    Ankle dorsiflexion    Ankle plantarflexion    Ankle inversion    Ankle eversion     (Blank rows = not tested)  LUMBAR SPECIAL TESTS:  Straight leg raise test: Negative, Single leg stance test: Negative, Stork standing: Negative, SI Compression/distraction test: Negative, FABER test: Negative, and Thomas test: Negative positive stork sign on R  FUNCTIONAL TESTS:  30 seconds chair stand test 2  GAIT: Distance walked: 33ft x2 Assistive device utilized: None Level of assistance: Complete Independence Comments: slow cadence, mild antalgic  TREATMENT DATE:  [facility] Adult PT Treatment:                                                DATE: 12/13/23 Eval and HEP Self Care: Additional minutes spent for educating on updated Therapeutic Home Exercise Program as well as comparing current status to condition at start of symptoms. This included exercises focusing on stretching, strengthening, with focus on eccentric aspects. Long term goals include an improvement in range of motion, strength, endurance as well as avoiding reinjury. Patient's frequency would include in 1-2 times a day, 3-5 times a week for a duration of 6-12 weeks. Proper technique shown and discussed handout in great detail. All questions were discussed and addressed.                                                                                                                                 PATIENT EDUCATION:  Education details: Discussed eval findings, rehab rationale and POC and patient is in agreement  Person educated: Patient Education method: Explanation Education comprehension: verbalized understanding and needs further education  HOME EXERCISE PROGRAM: Access Code: [email or web address] URL: [email or web address] Date: 12/13/2023 Prepared by: Gretta Leavens  Exercises - Static Prone on Elbows  - 2-3 x daily - 5 x weekly - 1 sets - 1 reps - 2 min hold - Seated Piriformis Stretch with Trunk Bend  - 2-3 x daily - 5 x weekly - 1 sets - 2 reps - 30s hold - Hip Flexor Stretch at Edge of Bed  - 2-3 x daily - 5 x weekly - 1 sets - 2 reps - 30s hold  ASSESSMENT:  CLINICAL IMPRESSION: Patient is a 78 y.o. male who was seen today for physical therapy evaluation and treatment for SI dysfunction.  Patient presents with R leg length discrepancy and positive R stork sig indicating and anterior R ilial rotation with subsequent L SI and piriformis irritation.  OBJECTIVE IMPAIRMENTS: Abnormal gait, decreased activity tolerance, decreased knowledge  of condition, decreased mobility, increased fascial restrictions, increased muscle spasms, postural dysfunction, and pain.   ACTIVITY LIMITATIONS: carrying, lifting, bending, sitting, standing, squatting, sleeping, and stairs  PERSONAL FACTORS: Age, Fitness, Past/current experiences, and Time since onset of injury/illness/exacerbation are also affecting patient's functional outcome.   REHAB POTENTIAL: good  CLINICAL DECISION MAKING: Evolving/moderate complexity  EVALUATION COMPLEXITY: Moderate   GOALS: Goals reviewed with patient? No    SHORT TERM GOALS=LONG TERM GOALS: Target date: 01/24/2024  Patient will acknowledge 6/10 pain at least once during episode of care   Baseline: 9/10 Goal status: INITIAL  2.  Patient will score at least 50% on LEFS to signify clinically meaningful improvement in functional abilities.   Baseline: 20/80 25% functional Goal status: INITIAL  3.  Patient will increase 30s chair stand reps from 2 to 8 with/without arms to demonstrate and improved functional ability with less pain/difficulty as well as reduce fall risk.  Baseline: 2 Goal status: INITIAL  4.  Patient to demonstrate independence in HEP  Baseline: [id number] Goal status: INITIAL  5.  Resolve stork sign  R/SI dysfunction Baseline: positive R stork sign Goal status: INITIAL  6.  Moderate L piriformis irritation Baseline: Severe L piriformis irritation Goal status: INITIAL  PLAN:  PT FREQUENCY: 1-2x/week  PT DURATION: 6 weeks  PLANNED INTERVENTIONS: 97110-Therapeutic exercises, 97530- Therapeutic activity, 97112- Neuromuscular re-education, 97535- Self Care, 78469- Manual therapy, [id number]- Gait training, Patient/Family education, Balance training, Stair training, Dry Needling, Joint mobilization, and Spinal mobilization.  PLAN FOR NEXT SESSION: HEP review and update, manual techniques as appropriate, aerobic tasks, ROM and flexibility activities, strengthening and PREs, TPDN, gait and balance training as needed    Date of referral: 11/28/23 Referring provider: Genetta Kenning, MD Referring diagnosis? 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 Unspecified  Nature of Condition: Chronic (continuous duration > 3 months)   Laterality: Lt  Current Functional Measure Score: LEFS  Objective measurements identify impairments when they are compared to normal values, the uninvolved extremity, and prior level of function.  [x]  Yes  []  No  Objective assessment of functional ability: Severe functional limitations   Briefly describe symptoms: Chief complaint left-sided buttock pain  How did symptoms start: undetermined  Average pain intensity:  Last 24 hours: 9  Past week: 8  How often does the pt experience symptoms? Constantly  How much have the symptoms interfered with usual daily activities? Quite a bit  How has condition changed since care began at this facility? NA - initial visit  In general, how is the patients overall health? Good   BACK PAIN (STarT Back Screening Tool) No   Eldon Greenland, PT 12/13/2023, 5:24 PM

## 2023-12-14 ENCOUNTER — Ambulatory Visit: Admitting: Physical Therapy

## 2023-12-19 ENCOUNTER — Ambulatory Visit

## 2023-12-20 ENCOUNTER — Ambulatory Visit: Admitting: Physical Therapy

## 2023-12-20 NOTE — Therapy (Signed)
 OUTPATIENT PHYSICAL THERAPY TREATMENT NOTE   Patient Name: DEMICHAEL TRAUM MRN: [id number] DOB:[birth year], 78 y.o., male Today's Date: 12/25/2023  END OF SESSION:  PT End of Session - 12/25/23 1443     Visit Number 2    Number of Visits 8    Date for PT Re-Evaluation 02/12/24    Authorization Type [employer]    PT Start Time 1445    PT Stop Time 1530    PT Time Calculation (min) 45 min    Activity Tolerance Patient tolerated treatment well    Behavior During Therapy [facility] for tasks assessed/performed              Past Medical History:  Diagnosis Date   Allergic rhinitis    Hypertension    Past Surgical History:  Procedure Laterality Date   COLONOSCOPY  04/11/2014   per Dr. 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   Statin intolerance 08/13/2019   Pain in left finger(s) 09/18/2018   Dyslipidemia 09/13/2018   OSA (obstructive sleep apnea) 10/17/2017   Seasonal allergies 05/29/2017   Insomnia 10/25/2016   Chronic neck pain 08/04/2016   Low back pain with left-sided sciatica 12/24/2014   Erectile dysfunction 08/15/2013   Environmental allergies 04/25/2013   HTN (hypertension) 04/02/2013    PCP: Donley Furth, MD   REFERRING PROVIDER: Genetta Kenning, MD  REFERRING DIAG: M53.3 (ICD-10-CM) - Sacroiliac dysfunction  Rationale for Evaluation and Treatment: Rehabilitation  THERAPY DIAG:  Sacroiliac joint dysfunction of left side  Other abnormalities of gait and mobility  Muscle weakness (generalized)  ONSET DATE: 06/18/23  SUBJECTIVE:                                                                                                                                                                                           SUBJECTIVE STATEMENT: Chief complaint  left-sided buttock pain History 78 year old male who states that [place] when he heard a pop and had immediate onset of pain in the left buttock. Review of chart indicates that he had primary care visit for left buttock pain on 03/06/2023 and that his pain had been present for 1 month prior to visit.  During that visit he stated he was walking up a hill when the pain onset occurred.  He was diagnosed with left gluteal pain.  The patient was using ibuprofen   and topical creams which produced a partial improvement in symptoms.  He was referred to orthopedics on 04/11/2019 for and an MRI of the lumbar spine was ordered.  He was trialed on muscle relaxers and tramadol .  During the Ortho PA follow-up visit on 05/16/2023 the patient was complaining of left-sided lateral hip pain.  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  There was severe L4-L5 foraminal stenosis as well.  Referral to physical therapy was made but the patient did not go to therapy. He has no numbness or tingling in the left lower extremity no weakness.  He still feels like his walking tolerance is reduced .  PERTINENT HISTORY:  Left buttock pain he has 2 positive provocative maneuvers suggesting sacroiliac disorder.  Imaging studies (images reviewed)  reveal impressive facet arthropathy bilateral L 4 5, L5-S1.  In addition patient likely has a muscular component to his pain as well. Send physical therapy and encouraged him to follow through on this.  Will see him back in a month. Will reassess, consider sacroiliac injections versus medial branch blocks on the left side Discussed with patient and wife went over spine model with the patient and wife.  PAIN:  Are you having pain? Yes: NPRS scale: 9/10 Pain location: L SI region Pain description: sharp Aggravating factors: walking, prolonged  standing/sitting Relieving factors: rest  PRECAUTIONS: None  RED FLAGS: None   WEIGHT BEARING RESTRICTIONS: No  FALLS:  Has patient fallen in last 6 months? No   OCCUPATION: retired  PLOF: Independent  PATIENT GOALS: To manage my low back/hip pain  NEXT MD VISIT: 12/26/23  OBJECTIVE:  Note: Objective measures were completed at Evaluation unless otherwise noted.  DIAGNOSTIC FINDINGS:  FINDINGS: Pelvic ring is intact. No acute fracture or dislocation is noted. Mild degenerative changes of the hip joints are noted bilaterally. No soft tissue changes are seen.   IMPRESSION: Degenerative change without acute abnormality.     Electronically Signed   By: Violeta Grey M.D.   On: 11/25/2023 21:18  FINDINGS: Pelvic ring is intact. No acute fracture or dislocation is noted. Mild degenerative changes of the hip joints are noted bilaterally. No soft tissue changes are seen.   IMPRESSION: Degenerative change without acute abnormality.     Electronically Signed   By: Violeta Grey M.D.   On: 11/25/2023 21:18  PATIENT SURVEYS:  LEFS 20/80 25% functional  MUSCLE LENGTH: Hamstrings: Right 80 deg; Left 80 deg Thomas test: Right mild restriction and L SI discomfort  POSTURE: flexed trunk   PALPATION: TTP L piriformis  LUMBAR ROM:   AROM eval  Flexion   Extension   Right lateral flexion   Left lateral flexion   Right rotation   Left rotation    (Blank rows = not tested)  LOWER EXTREMITY ROM:   WFL  Active  Right eval Left eval  Hip flexion    Hip extension    Hip abduction    Hip adduction    Hip internal rotation    Hip external rotation    Knee flexion    Knee extension    Ankle dorsiflexion    Ankle plantarflexion    Ankle inversion    Ankle eversion     (Blank rows = not tested)  LOWER EXTREMITY MMT:    MMT Right eval Left eval  Hip flexion    Hip extension    Hip  abduction    Hip adduction    Hip internal rotation    Hip external  rotation    Knee flexion    Knee extension    Ankle dorsiflexion    Ankle plantarflexion    Ankle inversion    Ankle eversion     (Blank rows = not tested)  LUMBAR SPECIAL TESTS:  Straight leg raise test: Negative, Single leg stance test: Negative, Stork standing: Negative, SI Compression/distraction test: Negative, FABER test: Negative, and Thomas test: Negative positive stork sign on R  FUNCTIONAL TESTS:  30 seconds chair stand test 2  GAIT: Distance walked: 53ft x2 Assistive device utilized: None Level of assistance: Complete Independence Comments: slow cadence, mild antalgic  TREATMENT DATE:  [facility] Adult PT Treatment:                                                DATE: 12/25/23 Therapeutic Exercise: Nustep L2 8 min Manual Therapy: L piriformis release 2 min R SI MET to correct anterior ilial rotation  Therapeutic Activity: Seated hamstring stretch 30sx2 B Supine L piriformis stretch 30s  Supine hip fallouts RTB 15x B, 15/15 unilaterally  [facility] Adult PT Treatment:                                                DATE: 12/13/23 Eval and HEP Self Care: Additional minutes spent for educating on updated Therapeutic Home Exercise Program as well as comparing current status to condition at start of symptoms. This included exercises focusing on stretching, strengthening, with focus on eccentric aspects. Long term goals include an improvement in range of motion, strength, endurance as well as avoiding reinjury. Patient's frequency would include in 1-2 times a day, 3-5 times a week for a duration of 6-12 weeks. Proper technique shown and discussed handout in great detail. All questions were discussed and addressed.                                                                                                                                 PATIENT EDUCATION:  Education details: Discussed eval findings, rehab rationale and POC and patient is in agreement  Person educated:  Patient Education method: Explanation Education comprehension: verbalized understanding and needs further education  HOME EXERCISE PROGRAM: Access Code: [email or web address] URL: [email or web address] Date: 12/13/2023 Prepared by: Gretta Leavens  Exercises - Static Prone on Elbows  - 2-3 x daily - 5 x weekly - 1 sets - 1 reps - 2 min hold - Seated Piriformis Stretch with Trunk Bend  - 2-3 x daily - 5 x weekly - 1 sets - 2 reps - 30s hold - Hip Flexor Stretch at [place]  of Bed  - 2-3 x daily - 5 x weekly - 1 sets - 2 reps - 30s hold  ASSESSMENT:  CLINICAL IMPRESSION: First f/u session reviewed HEP and added aerobic w/u, additional L piriformis stretching, B hamstring stretching.  Continued R stork sign and palpable L piriformis TP.  Incorporated hip strengthening against t-band as well as MET to correct R ilial rotation and R leg length discrepancy.  Leg length resolved following MET.   Patient is a 78 y.o. male who was seen today for physical therapy evaluation and treatment for SI dysfunction.  Patient presents with R leg length discrepancy and positive R stork sig indicating and anterior R ilial rotation with subsequent L SI and piriformis irritation.  OBJECTIVE IMPAIRMENTS: Abnormal gait, decreased activity tolerance, decreased knowledge of condition, decreased mobility, increased fascial restrictions, increased muscle spasms, postural dysfunction, and pain.   ACTIVITY LIMITATIONS: carrying, lifting, bending, sitting, standing, squatting, sleeping, and stairs  PERSONAL FACTORS: Age, Fitness, Past/current experiences, and Time since onset of injury/illness/exacerbation are also affecting patient's functional outcome.   REHAB POTENTIAL: good  CLINICAL DECISION MAKING: Evolving/moderate complexity  EVALUATION COMPLEXITY: Moderate   GOALS: Goals reviewed with patient? No    SHORT TERM GOALS=LONG TERM GOALS: Target date: 01/24/2024  Patient will acknowledge 6/10 pain at least  once during episode of care   Baseline: 9/10 Goal status: INITIAL  2.  Patient will score at least 50% on LEFS to signify clinically meaningful improvement in functional abilities.   Baseline: 20/80 25% functional Goal status: INITIAL  3.  Patient will increase 30s chair stand reps from 2 to 8 with/without arms to demonstrate and improved functional ability with less pain/difficulty as well as reduce fall risk.  Baseline: 2 Goal status: INITIAL  4.  Patient to demonstrate independence in HEP  Baseline: [id number] Goal status: INITIAL  5.  Resolve stork sign R/SI dysfunction Baseline: positive R stork sign Goal status: INITIAL  6.  Moderate L piriformis irritation Baseline: Severe L piriformis irritation Goal status: INITIAL  PLAN:  PT FREQUENCY: 1-2x/week  PT DURATION: 6 weeks  PLANNED INTERVENTIONS: 97110-Therapeutic exercises, 97530- Therapeutic activity, 97112- Neuromuscular re-education, 97535- Self Care, 56213- Manual therapy, [phone]- Gait training, Patient/Family education, Balance training, Stair training, Dry Needling, Joint mobilization, and Spinal mobilization.  PLAN FOR NEXT SESSION: HEP review and update, manual techniques as appropriate, aerobic tasks, ROM and flexibility activities, strengthening and PREs, TPDN, gait and balance training as needed    Date of referral: 11/28/23 Referring provider: Genetta Kenning, MD Referring diagnosis? 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 Unspecified  Nature of Condition: Chronic (continuous duration > 3 months)   Laterality: Lt  Current Functional Measure Score: LEFS  Objective measurements identify impairments when they are compared to normal values, the uninvolved extremity, and prior level of function.  [x]  Yes  []  No  Objective assessment of functional ability:  Severe functional limitations   Briefly describe symptoms: Chief complaint left-sided buttock pain  How did symptoms start: undetermined  Average pain intensity:  Last 24 hours: 9  Past week: 8  How often does the pt experience symptoms? Constantly  How much have the symptoms interfered with usual daily activities? Quite a bit  How has condition changed since care began at this facility? NA - initial visit  In general, how is the patients overall health? Good   BACK PAIN (STarT Back Screening  Tool) No   Kysean Sweet M Quincey Nored, PT 12/25/2023, 3:28 PM

## 2023-12-25 ENCOUNTER — Ambulatory Visit: Attending: Physical Medicine & Rehabilitation

## 2023-12-25 DIAGNOSIS — M6281 Muscle weakness (generalized): Secondary | ICD-10-CM | POA: Insufficient documentation

## 2023-12-25 DIAGNOSIS — M533 Sacrococcygeal disorders, not elsewhere classified: Secondary | ICD-10-CM | POA: Insufficient documentation

## 2023-12-25 DIAGNOSIS — R2689 Other abnormalities of gait and mobility: Secondary | ICD-10-CM | POA: Diagnosis not present

## 2023-12-26 ENCOUNTER — Encounter: Attending: Physical Medicine & Rehabilitation | Admitting: Physical Medicine & Rehabilitation

## 2023-12-26 ENCOUNTER — Encounter: Payer: Self-pay | Admitting: Physical Medicine & Rehabilitation

## 2023-12-26 VITALS — BP 165/97 | HR 57 | Ht 67.0 in | Wt 165.4 lb

## 2023-12-26 DIAGNOSIS — M7918 Myalgia, other site: Secondary | ICD-10-CM | POA: Diagnosis not present

## 2023-12-26 MED ORDER — KETOROLAC TROMETHAMINE 30 MG/ML IJ SOLN
30.0000 mg | Freq: Once | INTRAMUSCULAR | 0 refills | Status: DC
Start: 2023-12-26 — End: 2023-12-26

## 2023-12-26 MED ORDER — LIDOCAINE HCL 1 % IJ SOLN
1.0000 mL | Freq: Once | INTRAMUSCULAR | Status: AC
Start: 2023-12-26 — End: 2023-12-26
  Administered 2023-12-26: 1 mL

## 2023-12-26 MED ORDER — KETOROLAC TROMETHAMINE 30 MG/ML IJ SOLN
30.0000 mg | Freq: Once | INTRAMUSCULAR | Status: DC
Start: 2023-12-26 — End: 2023-12-26
  Administered 2023-12-26: 30 mg via INTRAVENOUS

## 2023-12-26 MED ORDER — KETOROLAC TROMETHAMINE 30 MG/ML IJ SOLN
30.0000 mg | Freq: Once | INTRAMUSCULAR | Status: AC
Start: 2023-12-26 — End: 2023-12-31

## 2023-12-26 MED ORDER — LIDOCAINE HCL 1 % IJ SOLN
1.0000 mL | Freq: Once | INTRAMUSCULAR | Status: DC
Start: 2023-12-26 — End: 2023-12-26

## 2023-12-26 NOTE — Addendum Note (Signed)
 Addended by: Candis Chamberlain on: 12/26/2023 04:28 PM   Modules accepted: Orders

## 2023-12-26 NOTE — Addendum Note (Signed)
 Addended by: Candis Chamberlain on: 12/26/2023 04:27 PM   Modules accepted: Orders

## 2023-12-26 NOTE — Addendum Note (Signed)
 Addended by: Candis Chamberlain on: 12/26/2023 04:38 PM   Modules accepted: Orders

## 2023-12-26 NOTE — Patient Instructions (Signed)

## 2023-12-26 NOTE — Addendum Note (Signed)
 Addended by: Candis Chamberlain on: 12/26/2023 04:44 PM   Modules accepted: Orders

## 2023-12-26 NOTE — Progress Notes (Signed)
 Subjective:    Patient ID: Darrell Baldwin, male    DOB: August 20, 1945, 78 y.o.   MRN: [id number]  HPI  78 year old male with chronic lumbar pain also has chronic buttock pain this has been going on for close to 1 year.  His original injury was while walking briskly hearing a pop around his buttock area. He was seen by orthopedics, had a lumbar MRI demonstrating facet arthropathy as well as foraminal stenosis L4-5 bilaterally as well as central stenosis L5-S1 causing S1 nerve root impingement. The patient did go to his physical therapy sessions had 2 sessions and is scheduled for several more.  He states that the physical therapy was helpful in alleviating his pain on a short-term basis.  We discussed that sometimes several visits are required to see a longer term benefit. He has no numbness or tingling in his leg no progressive weakness no bowel or bladder dysfunction Pain Inventory Average Pain 8 Pain Right Now 8 My pain is stabbing  In the last 24 hours, has pain interfered with the following? General activity 8 Relation with others 8 Enjoyment of life 10 What TIME of day is your pain at its worst? morning , daytime, evening, and night Sleep (in general) Poor  Pain is worse with: walking, sitting, standing, and some activites Pain improves with: rest, heat/ice, and therapy/exercise Relief from Meds: 0  Family History  Problem Relation Age of Onset   Breast cancer Sister    Diabetes Sister    Lung cancer Brother    Liver cancer Sister    Diabetes Sister    Colon cancer Neg Hx    Social History   Socioeconomic History   Marital status: Married    Spouse name: Not on file   Number of children: Not on file   Years of education: Not on file   Highest education level: Not on file  Occupational History   Not on file  Tobacco Use   Smoking status: Never   Smokeless tobacco: Never  Vaping Use   Vaping status: Never Used  Substance and Sexual Activity   Alcohol use: No     Alcohol/week: 0.0 standard drinks of alcohol   Drug use: No   Sexual activity: Not on file  Other Topics Concern   Not on file  Social History Narrative   Not on file   Social Drivers of Health   Financial Resource Strain: Low Risk  (08/15/2022)   Overall Financial Resource Strain (CARDIA)    Difficulty of Paying Living Expenses: Not hard at all  Food Insecurity: No Food Insecurity (08/15/2022)   Hunger Vital Sign    Worried About Running Out of Food in the Last Year: Never true    Ran Out of Food in the Last Year: Never true  Transportation Needs: No Transportation Needs (08/15/2022)   PRAPARE - Administrator, Civil Service (Medical): No    Lack of Transportation (Non-Medical): No  Physical Activity: Sufficiently Active (08/15/2022)   Exercise Vital Sign    Days of Exercise per Week: 7 days    Minutes of Exercise per Session: 30 min  Stress: No Stress Concern Present (08/15/2022)   [employer] of Occupational Health - Occupational Stress Questionnaire    Feeling of Stress : Not at all  Social Connections: Socially Integrated (08/15/2022)   Social Connection and Isolation Panel [NHANES]    Frequency of Communication with Friends and Family: More than three times a week    Frequency  of Social Gatherings with Friends and Family: More than three times a week    Attends Religious Services: More than 4 times per year    Active Member of Clubs or Organizations: Yes    Attends Banker Meetings: 1 to 4 times per year    Marital Status: Married   Past Surgical History:  Procedure Laterality Date   COLONOSCOPY  04/11/2014   per Dr. Grandville Lax, diverticulosis and internal hemorrhoids, no polyps, repeat in 10 yrs    Past Surgical History:  Procedure Laterality Date   COLONOSCOPY  04/11/2014   per Dr. Grandville Lax, diverticulosis and internal hemorrhoids, no polyps, repeat in 10 yrs    Past Medical History:  Diagnosis Date   Allergic rhinitis    Hypertension     BP (!) 165/97 (BP Location: Left Arm, Patient Position: Sitting, Cuff Size: Normal)   Pulse (!) 57   Ht 5\' 7"  (1.702 m)   Wt 165 lb 6.4 oz (75 kg)   SpO2 (!) 68%   BMI 25.91 kg/m   Opioid Risk Score:   Fall Risk Score:  `1  Depression screen [facility] 2/9     11/28/2023    2:38 PM 09/12/2023   11:04 AM 04/04/2023    3:45 PM 03/06/2023    3:34 PM 08/15/2022    3:34 PM 08/03/2022   11:13 AM 04/04/2022   10:25 AM  Depression screen PHQ 2/9  Decreased Interest 0 0 0 0 0 0 3  Down, Depressed, Hopeless 0 0 0 0 0 0 0  PHQ - 2 Score 0 0 0 0 0 0 3  Altered sleeping 0 0 0 0 0 0 0  Tired, decreased energy 0 0 0 0 0 0 0  Change in appetite 0 0 0 0 0 0 0  Feeling bad or failure about yourself  0 0 0 0 0 0 0  Trouble concentrating 0 0 0 0 0 0 0  Moving slowly or fidgety/restless 0 0 0 0 0 0 0  Suicidal thoughts 0 0 0 0 0 0 0  PHQ-9 Score 0 0 0 0 0 0 3  Difficult doing work/chores  Not difficult at all Not difficult at all Not difficult at all Not difficult at all Not difficult at all Not difficult at all      Review of Systems  Musculoskeletal:  Positive for back pain and myalgias.       Left sacroiliac pain   All other systems reviewed and are negative.      Objective:   Physical Exam  Tenderness over the left gluteus medius  Sacral thrust (prone) :Neg  FABER's:  neg Distraction (supine): neg Thigh thrust test: neg General No acute distress Mood affect appropriate Left gluteus medius area tenderness palpation in 2 areas of 1 closer to the iliac crest of 1 just 4 cm inferior to that. Patient has normal range of motion of the hips.  No pain with range of motion Negative straight leg raising bilaterally Ambulates without assistive device no evidence of toe drag or knee instability     Assessment & Plan:   #1.  Myofascial pain left gluteal this appears to be the main generator at this time I do think continued physical therapy would be the main treatment recommendation.   Discrete areas of muscle tenderness would be addressing this with trigger point injection. Trigger point  Trigger Point Injection  Indication: Left gluteal myofascial pain not relieved by medication management and other conservative care.  Informed consent was obtained after describing risk and benefits of the procedure with the patient, this includes bleeding, bruising, infection and medication side effects.  The patient wishes to proceed and has given written consent.  The patient was placed in a supine  position.  The Left glut medius  area was marked and prepped with Betadine.  It was entered with a 25-gauge 1-1/2 inch needle and 1 mL of 1% lidocaine  plus toradol 30mg /mL x 1mL was injected into each of 2  trigger points, after negative draw back for blood.  The patient tolerated the procedure well.  Post procedure instructions were given.  Toradol 30mg /cc x 1 mL plus 1% Lidocaine  x 1mL

## 2023-12-27 ENCOUNTER — Ambulatory Visit: Admitting: Physical Therapy

## 2023-12-27 DIAGNOSIS — R2689 Other abnormalities of gait and mobility: Secondary | ICD-10-CM | POA: Diagnosis not present

## 2023-12-27 DIAGNOSIS — M533 Sacrococcygeal disorders, not elsewhere classified: Secondary | ICD-10-CM

## 2023-12-27 DIAGNOSIS — M6281 Muscle weakness (generalized): Secondary | ICD-10-CM | POA: Diagnosis not present

## 2023-12-27 NOTE — Therapy (Addendum)
 OUTPATIENT PHYSICAL THERAPY DAILY NOTE   Patient Name: Darrell Baldwin MRN: [id number] DOB:[birth year], 78 y.o., male Today's Date: 12/27/2023  END OF SESSION:  PT End of Session - 12/27/23 1541     Visit Number 3    Number of Visits 8    Date for PT Re-Evaluation 02/12/24    Authorization Type [employer]    PT Start Time 0345    PT Stop Time 0425    PT Time Calculation (min) 40 min    Activity Tolerance Patient tolerated treatment well    Behavior During Therapy [facility] for tasks assessed/performed              Past Medical History:  Diagnosis Date   Allergic rhinitis    Hypertension    Past Surgical History:  Procedure Laterality Date   COLONOSCOPY  04/11/2014   per Dr. 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   Statin intolerance 08/13/2019   Pain in left finger(s) 09/18/2018   Dyslipidemia 09/13/2018   OSA (obstructive sleep apnea) 10/17/2017   Seasonal allergies 05/29/2017   Insomnia 10/25/2016   Chronic neck pain 08/04/2016   Low back pain with left-sided sciatica 12/24/2014   Erectile dysfunction 08/15/2013   Environmental allergies 04/25/2013   HTN (hypertension) 04/02/2013    PCP: Donley Furth, MD   REFERRING PROVIDER: Genetta Kenning, MD  REFERRING DIAG: M53.3 (ICD-10-CM) - Sacroiliac dysfunction  Rationale for Evaluation and Treatment: Rehabilitation  THERAPY DIAG:  Sacroiliac joint dysfunction of left side  ONSET DATE: 06/18/23  SUBJECTIVE:                                                                                                                                                                                           SUBJECTIVE STATEMENT:  Pt reports some improvement with pt but also had TP injection recently which was quite  helpful.  PERTINENT HISTORY:  Left buttock pain he has 2 positive provocative maneuvers suggesting sacroiliac disorder.  Imaging studies (images reviewed)  reveal impressive facet arthropathy bilateral L 4 5, L5-S1.  In addition patient likely has a muscular component to his pain as well. Send physical therapy and encouraged him to follow through on this.  Will see him back in a month. Will reassess, consider sacroiliac injections versus medial branch blocks on the left side Discussed with patient and wife went over spine model with the patient and wife.  PAIN:  Are you having pain? Yes: NPRS scale: 9/10 Pain location: L SI region Pain description: sharp Aggravating factors: walking, prolonged standing/sitting Relieving factors: rest  PRECAUTIONS: None  RED FLAGS: None   WEIGHT BEARING RESTRICTIONS: No  FALLS:  Has patient fallen in last 6 months? No   OCCUPATION: retired  PLOF: Independent  PATIENT GOALS: To manage my low back/hip pain  NEXT MD VISIT: 12/26/23  OBJECTIVE:  Note: Objective measures were completed at Evaluation unless otherwise noted.  DIAGNOSTIC FINDINGS:  FINDINGS: Pelvic ring is intact. No acute fracture or dislocation is noted. Mild degenerative changes of the hip joints are noted bilaterally. No soft tissue changes are seen.   IMPRESSION: Degenerative change without acute abnormality.     Electronically Signed   By: Violeta Grey M.D.   On: 11/25/2023 21:18  FINDINGS: Pelvic ring is intact. No acute fracture or dislocation is noted. Mild degenerative changes of the hip joints are noted bilaterally. No soft tissue changes are seen.   IMPRESSION: Degenerative change without acute abnormality.     Electronically Signed   By: Violeta Grey M.D.   On: 11/25/2023 21:18  PATIENT SURVEYS:  LEFS 20/80 25% functional  MUSCLE LENGTH: Hamstrings: Right 80 deg; Left 80 deg Thomas test: Right mild restriction and L SI discomfort  POSTURE:  flexed trunk   PALPATION: TTP L piriformis  LUMBAR ROM:   AROM eval  Flexion   Extension   Right lateral flexion   Left lateral flexion   Right rotation   Left rotation    (Blank rows = not tested)  LOWER EXTREMITY ROM:   WFL  Active  Right eval Left eval  Hip flexion    Hip extension    Hip abduction    Hip adduction    Hip internal rotation    Hip external rotation    Knee flexion    Knee extension    Ankle dorsiflexion    Ankle plantarflexion    Ankle inversion    Ankle eversion     (Blank rows = not tested)  LOWER EXTREMITY MMT:    MMT Right eval Left eval  Hip flexion    Hip extension    Hip abduction    Hip adduction    Hip internal rotation    Hip external rotation    Knee flexion    Knee extension    Ankle dorsiflexion    Ankle plantarflexion    Ankle inversion    Ankle eversion     (Blank rows = not tested)  LUMBAR SPECIAL TESTS:  Straight leg raise test: Negative, Single leg stance test: Negative, Stork standing: Negative, SI Compression/distraction test: Negative, FABER test: Negative, and Thomas test: Negative positive stork sign on R  FUNCTIONAL TESTS:  30 seconds chair stand test 2  GAIT: Distance walked: 71ft x2 Assistive device utilized: None Level of assistance: Complete Independence Comments: slow cadence, mild antalgic  TREATMENT DATE:   [facility] Adult PT Treatment:                                                DATE: 12/27/23 Therapeutic Exercise: Nustep L2 8 min LTR Active HS stretch in supine Supine hip flexor + SKTC edge of table Bridge on ball - 2-3'' hold - 2x10 Piriformis stretch Hip adduction with pilates ring - 5'' 2x10 S/L clam -  2x10 STS - 2x10 - 10#  [facility] Adult PT Treatment:                                                DATE: 12/25/23 Therapeutic Exercise: Nustep L2 8 min Manual Therapy: L piriformis release 2 min R SI MET to correct anterior ilial rotation  Therapeutic Activity: Seated hamstring stretch  30sx2 B Supine L piriformis stretch 30s  Supine hip fallouts RTB 15x B, 15/15 unilaterally  [facility] Adult PT Treatment:                                                DATE: 12/13/23 Eval and HEP Self Care: Additional minutes spent for educating on updated Therapeutic Home Exercise Program as well as comparing current status to condition at start of symptoms. This included exercises focusing on stretching, strengthening, with focus on eccentric aspects. Long term goals include an improvement in range of motion, strength, endurance as well as avoiding reinjury. Patient's frequency would include in 1-2 times a day, 3-5 times a week for a duration of 6-12 weeks. Proper technique shown and discussed handout in great detail. All questions were discussed and addressed.                                                                                                                                 PATIENT EDUCATION:  Education details: Discussed eval findings, rehab rationale and POC and patient is in agreement  Person educated: Patient Education method: Explanation Education comprehension: verbalized understanding and needs further education  HOME EXERCISE PROGRAM: Access Code: [email or web address] URL: [email or web address] Date: 12/13/2023 Prepared by: Gretta Leavens  Exercises - Static Prone on Elbows  - 2-3 x daily - 5 x weekly - 1 sets - 1 reps - 2 min hold - Seated Piriformis Stretch with Trunk Bend  - 2-3 x daily - 5 x weekly - 1 sets - 2 reps - 30s hold - Hip Flexor Stretch at Edge of Bed  - 2-3 x daily - 5 x weekly - 1 sets - 2 reps - 30s hold  ASSESSMENT:  CLINICAL IMPRESSION:   Raykwon tolerated session well with no adverse reaction.  Focused on hip mobility/stretching and progressive strengthening.  Pt reports fatigue but no increase in pain.  Pain rated 3/10 today following trigger point injection.   Patient is a 78 y.o. male who was seen today for physical therapy evaluation and  treatment for SI dysfunction.  Patient presents with R leg length discrepancy and positive R stork sig indicating and anterior R ilial rotation with subsequent L SI and piriformis irritation.  OBJECTIVE IMPAIRMENTS: Abnormal gait, decreased activity tolerance, decreased knowledge  of condition, decreased mobility, increased fascial restrictions, increased muscle spasms, postural dysfunction, and pain.   ACTIVITY LIMITATIONS: carrying, lifting, bending, sitting, standing, squatting, sleeping, and stairs  PERSONAL FACTORS: Age, Fitness, Past/current experiences, and Time since onset of injury/illness/exacerbation are also affecting patient's functional outcome.   REHAB POTENTIAL: good  CLINICAL DECISION MAKING: Evolving/moderate complexity  EVALUATION COMPLEXITY: Moderate   GOALS: Goals reviewed with patient? No    SHORT TERM GOALS=LONG TERM GOALS: Target date: 01/24/2024  Patient will acknowledge 6/10 pain at least once during episode of care   Baseline: 9/10 Goal status: INITIAL  2.  Patient will score at least 50% on LEFS to signify clinically meaningful improvement in functional abilities.   Baseline: 20/80 25% functional Goal status: INITIAL  3.  Patient will increase 30s chair stand reps from 2 to 8 with/without arms to demonstrate and improved functional ability with less pain/difficulty as well as reduce fall risk.  Baseline: 2 Goal status: INITIAL  4.  Patient to demonstrate independence in HEP  Baseline: [id number] Goal status: INITIAL  5.  Resolve stork sign R/SI dysfunction Baseline: positive R stork sign Goal status: INITIAL  6.  Moderate L piriformis irritation Baseline: Severe L piriformis irritation Goal status: INITIAL  PLAN:  PT FREQUENCY: 1-2x/week  PT DURATION: 6 weeks  PLANNED INTERVENTIONS: 97110-Therapeutic exercises, 97530- Therapeutic activity, 97112- Neuromuscular re-education, 97535- Self Care, 16109- Manual therapy, [phone]- Gait training,  Patient/Family education, Balance training, Stair training, Dry Needling, Joint mobilization, and Spinal mobilization.  PLAN FOR NEXT SESSION: HEP review and update, manual techniques as appropriate, aerobic tasks, ROM and flexibility activities, strengthening and PREs, TPDN, gait and balance training as needed    Date of referral: 11/28/23 Referring provider: Genetta Kenning, MD Referring diagnosis? 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 Unspecified  Nature of Condition: Chronic (continuous duration > 3 months)   Laterality: Lt  Current Functional Measure Score: LEFS  Objective measurements identify impairments when they are compared to normal values, the uninvolved extremity, and prior level of function.  [x]  Yes  []  No  Objective assessment of functional ability: Severe functional limitations   Briefly describe symptoms: Chief complaint left-sided buttock pain  How did symptoms start: undetermined  Average pain intensity:  Last 24 hours: 9  Past week: 8  How often does the pt experience symptoms? Constantly  How much have the symptoms interfered with usual daily activities? Quite a bit  How has condition changed since care began at this facility? NA - initial visit  In general, how is the patients overall health? Good   BACK PAIN (STarT Back Screening Tool) No   Marquis Sitter, PT 12/27/2023, 4:26 PM

## 2023-12-29 NOTE — Therapy (Deleted)
 OUTPATIENT PHYSICAL THERAPY DAILY NOTE   Patient Name: Darrell Baldwin MRN: [id number] DOB:[birth year], 78 y.o., male Today's Date: 12/29/2023  END OF SESSION:     Past Medical History:  Diagnosis Date   Allergic rhinitis    Hypertension    Past Surgical History:  Procedure Laterality Date   COLONOSCOPY  04/11/2014   per Dr. 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  ONSET DATE: 06/18/23  SUBJECTIVE:                                                                                                                                                                                           SUBJECTIVE STATEMENT:  Pt reports some improvement with pt but also had TP injection recently which was quite helpful.  PERTINENT HISTORY:  Left buttock pain he has 2 positive provocative maneuvers suggesting sacroiliac disorder.  Imaging studies (images reviewed)  reveal impressive facet arthropathy bilateral L 4 5, L5-S1.  In addition patient likely has a muscular component to his pain as well. Send physical therapy and encouraged him to follow through on this.  Will see him back in a  month. Will reassess, consider sacroiliac injections versus medial branch blocks on the left side Discussed with patient and wife went over spine model with the patient and wife.  PAIN:  Are you having pain? Yes: NPRS scale: 9/10 Pain location: L SI region Pain description: sharp Aggravating factors: walking, prolonged standing/sitting Relieving factors: rest  PRECAUTIONS: None  RED FLAGS: None   WEIGHT BEARING RESTRICTIONS: No  FALLS:  Has patient fallen in last 6 months? No   OCCUPATION: retired  PLOF: Independent  PATIENT GOALS: To manage my low back/hip pain  NEXT MD VISIT: 12/26/23  OBJECTIVE:  Note: Objective measures were completed at Evaluation unless otherwise noted.  DIAGNOSTIC FINDINGS:  FINDINGS: Pelvic  ring is intact. No acute fracture or dislocation is noted. Mild degenerative changes of the hip joints are noted bilaterally. No soft tissue changes are seen.   IMPRESSION: Degenerative change without acute abnormality.     Electronically Signed   By: Violeta Grey M.D.   On: 11/25/2023 21:18  FINDINGS: Pelvic ring is intact. No acute fracture or dislocation is noted. Mild degenerative changes of the hip joints are noted bilaterally. No soft tissue changes are seen.   IMPRESSION: Degenerative change without acute abnormality.     Electronically Signed   By: Violeta Grey M.D.   On: 11/25/2023 21:18  PATIENT SURVEYS:  LEFS 20/80 25% functional  MUSCLE LENGTH: Hamstrings: Right 80 deg; Left 80 deg Thomas test: Right mild restriction and L SI discomfort  POSTURE: flexed trunk   PALPATION: TTP L piriformis  LUMBAR ROM:   AROM eval  Flexion   Extension   Right lateral flexion   Left lateral flexion   Right rotation   Left rotation    (Blank rows = not tested)  LOWER EXTREMITY ROM:   WFL  Active  Right eval Left eval  Hip flexion    Hip extension    Hip abduction    Hip adduction    Hip internal rotation    Hip external  rotation    Knee flexion    Knee extension    Ankle dorsiflexion    Ankle plantarflexion    Ankle inversion    Ankle eversion     (Blank rows = not tested)  LOWER EXTREMITY MMT:    MMT Right eval Left eval  Hip flexion    Hip extension    Hip abduction    Hip adduction    Hip internal rotation    Hip external rotation    Knee flexion    Knee extension    Ankle dorsiflexion    Ankle plantarflexion    Ankle inversion    Ankle eversion     (Blank rows = not tested)  LUMBAR SPECIAL TESTS:  Straight leg raise test: Negative, Single leg stance test: Negative, Stork standing: Negative, SI Compression/distraction test: Negative, FABER test: Negative, and Thomas test: Negative positive stork sign on R  FUNCTIONAL TESTS:  30 seconds chair stand test 2  GAIT: Distance walked: 34ft x2 Assistive device utilized: None Level of assistance: Complete Independence Comments: slow cadence, mild antalgic  TREATMENT DATE:   [facility] Adult PT Treatment:                                                DATE: 12/27/23 Therapeutic Exercise: Nustep L2 8 min LTR Active HS stretch in supine Supine hip flexor + SKTC edge of table Bridge on ball - 2-3'' hold - 2x10 Piriformis stretch Hip adduction with pilates ring - 5'' 2x10 S/L clam - 2x10 STS - 2x10 - 10#  [facility] Adult PT Treatment:                                                DATE: 12/25/23 Therapeutic Exercise: Nustep L2 8 min Manual Therapy: L piriformis release 2 min R SI MET to correct anterior ilial rotation  Therapeutic Activity: Seated hamstring stretch 30sx2 B Supine  L piriformis stretch 30s  Supine hip fallouts RTB 15x B, 15/15 unilaterally  [facility] Adult PT Treatment:                                                DATE: 12/13/23 Eval and HEP Self Care: Additional minutes spent for educating on updated Therapeutic Home Exercise Program as well as comparing current status to condition at start of symptoms. This included exercises  focusing on stretching, strengthening, with focus on eccentric aspects. Long term goals include an improvement in range of motion, strength, endurance as well as avoiding reinjury. Patient's frequency would include in 1-2 times a day, 3-5 times a week for a duration of 6-12 weeks. Proper technique shown and discussed handout in great detail. All questions were discussed and addressed.                                                                                                                                 PATIENT EDUCATION:  Education details: Discussed eval findings, rehab rationale and POC and patient is in agreement  Person educated: Patient Education method: Explanation Education comprehension: verbalized understanding and needs further education  HOME EXERCISE PROGRAM: Access Code: [email or web address] URL: [email or web address] Date: 12/13/2023 Prepared by: Gretta Leavens  Exercises - Static Prone on Elbows  - 2-3 x daily - 5 x weekly - 1 sets - 1 reps - 2 min hold - Seated Piriformis Stretch with Trunk Bend  - 2-3 x daily - 5 x weekly - 1 sets - 2 reps - 30s hold - Hip Flexor Stretch at Edge of Bed  - 2-3 x daily - 5 x weekly - 1 sets - 2 reps - 30s hold  ASSESSMENT:  CLINICAL IMPRESSION:   Ausencio tolerated session well with no adverse reaction.  Focused on hip mobility/stretching and progressive strengthening.  Pt reports fatigue but no increase in pain.  Pain rated 3/10 today following trigger point injection.   Patient is a 78 y.o. male who was seen today for physical therapy evaluation and treatment for SI dysfunction.  Patient presents with R leg length discrepancy and positive R stork sig indicating and anterior R ilial rotation with subsequent L SI and piriformis irritation.  OBJECTIVE IMPAIRMENTS: Abnormal gait, decreased activity tolerance, decreased knowledge of condition, decreased mobility, increased fascial restrictions, increased muscle spasms, postural  dysfunction, and pain.   ACTIVITY LIMITATIONS: carrying, lifting, bending, sitting, standing, squatting, sleeping, and stairs  PERSONAL FACTORS: Age, Fitness, Past/current experiences, and Time since onset of injury/illness/exacerbation are also affecting patient's functional outcome.   REHAB POTENTIAL: good  CLINICAL DECISION MAKING: Evolving/moderate complexity  EVALUATION COMPLEXITY: Moderate   GOALS: Goals reviewed with patient? No    SHORT TERM GOALS=LONG TERM GOALS: Target date: 01/24/2024  Patient will acknowledge 6/10 pain at least once during  episode of care   Baseline: 9/10 Goal status: INITIAL  2.  Patient will score at least 50% on LEFS to signify clinically meaningful improvement in functional abilities.   Baseline: 20/80 25% functional Goal status: INITIAL  3.  Patient will increase 30s chair stand reps from 2 to 8 with/without arms to demonstrate and improved functional ability with less pain/difficulty as well as reduce fall risk.  Baseline: 2 Goal status: INITIAL  4.  Patient to demonstrate independence in HEP  Baseline: [id number] Goal status: INITIAL  5.  Resolve stork sign R/SI dysfunction Baseline: positive R stork sign Goal status: INITIAL  6.  Moderate L piriformis irritation Baseline: Severe L piriformis irritation Goal status: INITIAL  PLAN:  PT FREQUENCY: 1-2x/week  PT DURATION: 6 weeks  PLANNED INTERVENTIONS: 97110-Therapeutic exercises, 97530- Therapeutic activity, 97112- Neuromuscular re-education, 97535- Self Care, [id number]- Manual therapy, [phone]- Gait training, Patient/Family education, Balance training, Stair training, Dry Needling, Joint mobilization, and Spinal mobilization.  PLAN FOR NEXT SESSION: HEP review and update, manual techniques as appropriate, aerobic tasks, ROM and flexibility activities, strengthening and PREs, TPDN, gait and balance training as needed    Date of referral: 11/28/23 Referring provider: Genetta Kenning,  MD Referring diagnosis? 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 Unspecified  Nature of Condition: Chronic (continuous duration > 3 months)   Laterality: Lt  Current Functional Measure Score: LEFS  Objective measurements identify impairments when they are compared to normal values, the uninvolved extremity, and prior level of function.  [x]  Yes  []  No  Objective assessment of functional ability: Severe functional limitations   Briefly describe symptoms: Chief complaint left-sided buttock pain  How did symptoms start: undetermined  Average pain intensity:  Last 24 hours: 9  Past week: 8  How often does the pt experience symptoms? Constantly  How much have the symptoms interfered with usual daily activities? Quite a bit  How has condition changed since care began at this facility? NA - initial visit  In general, how is the patients overall health? Good   BACK PAIN (STarT Back Screening Tool) No   Eldon Greenland, PT 12/29/2023, 8:51 AM

## 2024-01-01 NOTE — Therapy (Deleted)
 OUTPATIENT PHYSICAL THERAPY DAILY NOTE   Patient Name: Darrell Baldwin MRN: [id number] DOB:02/17/1946, 78 y.o., male Today's Date: 01/01/2024  END OF SESSION:     Past Medical History:  Diagnosis Date   Allergic rhinitis    Hypertension    Past Surgical History:  Procedure Laterality Date   COLONOSCOPY  04/11/2014   per Dr. 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  ONSET DATE: 06/18/23  SUBJECTIVE:                                                                                                                                                                                           SUBJECTIVE STATEMENT:  Pt reports some improvement with pt but also had TP injection recently which was quite helpful.  PERTINENT HISTORY:  Left buttock pain he has 2 positive provocative maneuvers suggesting sacroiliac disorder.  Imaging studies (images reviewed)  reveal impressive facet arthropathy bilateral L 4 5, L5-S1.  In addition patient likely has a muscular component to his pain as well. Send physical therapy and encouraged him to follow through on this.  Will see him back in a  month. Will reassess, consider sacroiliac injections versus medial branch blocks on the left side Discussed with patient and wife went over spine model with the patient and wife.  PAIN:  Are you having pain? Yes: NPRS scale: 9/10 Pain location: L SI region Pain description: sharp Aggravating factors: walking, prolonged standing/sitting Relieving factors: rest  PRECAUTIONS: None  RED FLAGS: None   WEIGHT BEARING RESTRICTIONS: No  FALLS:  Has patient fallen in last 6 months? No   OCCUPATION: retired  PLOF: Independent  PATIENT GOALS: To manage my low back/hip pain  NEXT MD VISIT: 12/26/23  OBJECTIVE:  Note: Objective measures were completed at Evaluation unless otherwise noted.  DIAGNOSTIC FINDINGS:  FINDINGS: Pelvic  ring is intact. No acute fracture or dislocation is noted. Mild degenerative changes of the hip joints are noted bilaterally. No soft tissue changes are seen.   IMPRESSION: Degenerative change without acute abnormality.     Electronically Signed   By: Violeta Grey M.D.   On: 11/25/2023 21:18  FINDINGS: Pelvic ring is intact. No acute fracture or dislocation is noted. Mild degenerative changes of the hip joints are noted bilaterally. No soft tissue changes are seen.   IMPRESSION: Degenerative change without acute abnormality.     Electronically Signed   By: Violeta Grey M.D.   On: 11/25/2023 21:18  PATIENT SURVEYS:  LEFS 20/80 25% functional  MUSCLE LENGTH: Hamstrings: Right 80 deg; Left 80 deg Thomas test: Right mild restriction and L SI discomfort  POSTURE: flexed trunk   PALPATION: TTP L piriformis  LUMBAR ROM:   AROM eval  Flexion   Extension   Right lateral flexion   Left lateral flexion   Right rotation   Left rotation    (Blank rows = not tested)  LOWER EXTREMITY ROM:   WFL  Active  Right eval Left eval  Hip flexion    Hip extension    Hip abduction    Hip adduction    Hip internal rotation    Hip external  rotation    Knee flexion    Knee extension    Ankle dorsiflexion    Ankle plantarflexion    Ankle inversion    Ankle eversion     (Blank rows = not tested)  LOWER EXTREMITY MMT:    MMT Right eval Left eval  Hip flexion    Hip extension    Hip abduction    Hip adduction    Hip internal rotation    Hip external rotation    Knee flexion    Knee extension    Ankle dorsiflexion    Ankle plantarflexion    Ankle inversion    Ankle eversion     (Blank rows = not tested)  LUMBAR SPECIAL TESTS:  Straight leg raise test: Negative, Single leg stance test: Negative, Stork standing: Negative, SI Compression/distraction test: Negative, FABER test: Negative, and Thomas test: Negative positive stork sign on R  FUNCTIONAL TESTS:  30 seconds chair stand test 2  GAIT: Distance walked: 64ft x2 Assistive device utilized: None Level of assistance: Complete Independence Comments: slow cadence, mild antalgic  TREATMENT DATE:   [facility] Adult PT Treatment:                                                DATE: 12/27/23 Therapeutic Exercise: Nustep L2 8 min LTR Active HS stretch in supine Supine hip flexor + SKTC edge of table Bridge on ball - 2-3'' hold - 2x10 Piriformis stretch Hip adduction with pilates ring - 5'' 2x10 S/L clam - 2x10 STS - 2x10 - 10#  [facility] Adult PT Treatment:                                                DATE: 12/25/23 Therapeutic Exercise: Nustep L2 8 min Manual Therapy: L piriformis release 2 min R SI MET to correct anterior ilial rotation  Therapeutic Activity: Seated hamstring stretch 30sx2 B Supine  L piriformis stretch 30s  Supine hip fallouts RTB 15x B, 15/15 unilaterally  [facility] Adult PT Treatment:                                                DATE: 12/13/23 Eval and HEP Self Care: Additional minutes spent for educating on updated Therapeutic Home Exercise Program as well as comparing current status to condition at start of symptoms. This included exercises  focusing on stretching, strengthening, with focus on eccentric aspects. Long term goals include an improvement in range of motion, strength, endurance as well as avoiding reinjury. Patient's frequency would include in 1-2 times a day, 3-5 times a week for a duration of 6-12 weeks. Proper technique shown and discussed handout in great detail. All questions were discussed and addressed.                                                                                                                                 PATIENT EDUCATION:  Education details: Discussed eval findings, rehab rationale and POC and patient is in agreement  Person educated: Patient Education method: Explanation Education comprehension: verbalized understanding and needs further education  HOME EXERCISE PROGRAM: Access Code: [email or web address] URL: [email or web address] Date: 12/13/2023 Prepared by: Gretta Leavens  Exercises - Static Prone on Elbows  - 2-3 x daily - 5 x weekly - 1 sets - 1 reps - 2 min hold - Seated Piriformis Stretch with Trunk Bend  - 2-3 x daily - 5 x weekly - 1 sets - 2 reps - 30s hold - Hip Flexor Stretch at Edge of Bed  - 2-3 x daily - 5 x weekly - 1 sets - 2 reps - 30s hold  ASSESSMENT:  CLINICAL IMPRESSION:   Daemon tolerated session well with no adverse reaction.  Focused on hip mobility/stretching and progressive strengthening.  Pt reports fatigue but no increase in pain.  Pain rated 3/10 today following trigger point injection.   Patient is a 78 y.o. male who was seen today for physical therapy evaluation and treatment for SI dysfunction.  Patient presents with R leg length discrepancy and positive R stork sig indicating and anterior R ilial rotation with subsequent L SI and piriformis irritation.  OBJECTIVE IMPAIRMENTS: Abnormal gait, decreased activity tolerance, decreased knowledge of condition, decreased mobility, increased fascial restrictions, increased muscle spasms, postural  dysfunction, and pain.   ACTIVITY LIMITATIONS: carrying, lifting, bending, sitting, standing, squatting, sleeping, and stairs  PERSONAL FACTORS: Age, Fitness, Past/current experiences, and Time since onset of injury/illness/exacerbation are also affecting patient's functional outcome.   REHAB POTENTIAL: good  CLINICAL DECISION MAKING: Evolving/moderate complexity  EVALUATION COMPLEXITY: Moderate   GOALS: Goals reviewed with patient? No    SHORT TERM GOALS=LONG TERM GOALS: Target date: 01/24/2024  Patient will acknowledge 6/10 pain at least once during  episode of care   Baseline: 9/10 Goal status: INITIAL  2.  Patient will score at least 50% on LEFS to signify clinically meaningful improvement in functional abilities.   Baseline: 20/80 25% functional Goal status: INITIAL  3.  Patient will increase 30s chair stand reps from 2 to 8 with/without arms to demonstrate and improved functional ability with less pain/difficulty as well as reduce fall risk.  Baseline: 2 Goal status: INITIAL  4.  Patient to demonstrate independence in HEP  Baseline: [id number] Goal status: INITIAL  5.  Resolve stork sign R/SI dysfunction Baseline: positive R stork sign Goal status: INITIAL  6.  Moderate L piriformis irritation Baseline: Severe L piriformis irritation Goal status: INITIAL  PLAN:  PT FREQUENCY: 1-2x/week  PT DURATION: 6 weeks  PLANNED INTERVENTIONS: 97110-Therapeutic exercises, 97530- Therapeutic activity, 97112- Neuromuscular re-education, 97535- Self Care, 82956- Manual therapy, [phone]- Gait training, Patient/Family education, Balance training, Stair training, Dry Needling, Joint mobilization, and Spinal mobilization.  PLAN FOR NEXT SESSION: HEP review and update, manual techniques as appropriate, aerobic tasks, ROM and flexibility activities, strengthening and PREs, TPDN, gait and balance training as needed    Date of referral: 11/28/23 Referring provider: Genetta Kenning,  MD Referring diagnosis? 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 Unspecified  Nature of Condition: Chronic (continuous duration > 3 months)   Laterality: Lt  Current Functional Measure Score: LEFS  Objective measurements identify impairments when they are compared to normal values, the uninvolved extremity, and prior level of function.  [x]  Yes  []  No  Objective assessment of functional ability: Severe functional limitations   Briefly describe symptoms: Chief complaint left-sided buttock pain  How did symptoms start: undetermined  Average pain intensity:  Last 24 hours: 9  Past week: 8  How often does the pt experience symptoms? Constantly  How much have the symptoms interfered with usual daily activities? Quite a bit  How has condition changed since care began at this facility? NA - initial visit  In general, how is the patients overall health? Good   BACK PAIN (STarT Back Screening Tool) No   Eldon Greenland, PT 01/01/2024, 10:08 AM

## 2024-01-02 ENCOUNTER — Ambulatory Visit

## 2024-01-02 ENCOUNTER — Telehealth: Payer: Self-pay

## 2024-01-02 NOTE — Telephone Encounter (Signed)
 Patient's spouse called by different clinicians to inform of missed appointments by both parties.  Spouse related appointments were not listed on paper handout.

## 2024-01-03 NOTE — Therapy (Signed)
 OUTPATIENT PHYSICAL THERAPY DAILY NOTE   Patient Name: Darrell Baldwin MRN: [id number] DOB:[birth year], 78 y.o., male Today's Date: 01/04/2024  END OF SESSION:  PT End of Session - 01/04/24 1527     Visit Number 4    Number of Visits 8    Date for PT Re-Evaluation 02/12/24    Authorization Type [employer]    PT Start Time 1530    PT Stop Time 1610    PT Time Calculation (min) 40 min    Activity Tolerance Patient tolerated treatment well    Behavior During Therapy [facility] for tasks assessed/performed            Past Medical History:  Diagnosis Date   Allergic rhinitis    Hypertension    Past Surgical History:  Procedure Laterality Date   COLONOSCOPY  04/11/2014   per Dr. 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   Statin intolerance 08/13/2019   Pain in left finger(s) 09/18/2018   Dyslipidemia 09/13/2018   OSA (obstructive sleep apnea) 10/17/2017   Seasonal allergies 05/29/2017   Insomnia 10/25/2016   Chronic neck pain 08/04/2016   Low back pain with left-sided sciatica 12/24/2014   Erectile dysfunction 08/15/2013   Environmental allergies 04/25/2013   HTN (hypertension) 04/02/2013    PCP: Donley Furth, MD   REFERRING PROVIDER: Genetta Kenning, MD  REFERRING DIAG: 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  PERTINENT HISTORY:  Left buttock pain he has 2 positive provocative maneuvers suggesting sacroiliac disorder.  Imaging studies (images reviewed)  reveal impressive facet arthropathy bilateral L 4 5, L5-S1.  In addition patient likely has a muscular component to his pain as well. Send physical therapy and encouraged him to follow through on this.  Will see him back in a month. Will reassess, consider sacroiliac injections versus medial branch blocks on the left side Discussed with patient and wife went over spine model with the  patient and wife.  PAIN:  Are you having pain? Yes: NPRS scale: 9/10 Pain location: L SI region Pain description: sharp Aggravating factors: walking, prolonged standing/sitting Relieving factors: rest  PRECAUTIONS: None  RED FLAGS: None   WEIGHT BEARING RESTRICTIONS: No  FALLS:  Has patient fallen in last 6 months? No   OCCUPATION: retired  PLOF: Independent  PATIENT GOALS: To manage my low back/hip pain  NEXT MD VISIT: 12/26/23  OBJECTIVE:  Note: Objective measures were completed at Evaluation unless otherwise noted.  DIAGNOSTIC FINDINGS:  FINDINGS: Pelvic ring is intact. No acute fracture or dislocation is noted. Mild degenerative changes of the hip joints are noted bilaterally. No soft tissue changes are seen.   IMPRESSION: Degenerative change without acute abnormality.     Electronically Signed   By: Violeta Grey M.D.   On: 11/25/2023 21:18  FINDINGS: Pelvic ring is intact. No acute fracture or dislocation is noted. Mild degenerative changes of the hip joints are noted bilaterally. No soft tissue changes are seen.   IMPRESSION: Degenerative change without acute abnormality.     Electronically Signed   By: Violeta Grey M.D.   On: 11/25/2023 21:18  PATIENT SURVEYS:  LEFS 20/80 25% functional  MUSCLE LENGTH: Hamstrings: Right 80 deg; Left 80 deg Thomas test: Right mild restriction and  L SI discomfort  POSTURE: flexed trunk   PALPATION: TTP L piriformis  LUMBAR ROM:   AROM eval  Flexion   Extension   Right lateral flexion   Left lateral flexion   Right rotation   Left rotation    (Blank rows = not tested)  LOWER EXTREMITY ROM:   WFL  Active  Right eval Left eval  Hip flexion    Hip extension    Hip abduction    Hip adduction    Hip internal rotation    Hip external rotation    Knee flexion    Knee extension    Ankle dorsiflexion    Ankle plantarflexion    Ankle inversion    Ankle eversion     (Blank rows = not tested)  LOWER EXTREMITY MMT:    MMT Right eval Left eval  Hip flexion    Hip extension    Hip abduction    Hip adduction    Hip internal rotation    Hip external rotation    Knee flexion    Knee extension    Ankle dorsiflexion    Ankle plantarflexion    Ankle inversion    Ankle eversion     (Blank rows = not tested)  LUMBAR SPECIAL TESTS:  Straight leg raise test: Negative, Single leg stance test: Negative, Stork standing: Negative, SI Compression/distraction test: Negative, FABER test: Negative, and Thomas test: Negative positive stork sign on R  FUNCTIONAL TESTS:  30 seconds chair stand test 2  GAIT: Distance walked: 75ft x2 Assistive device utilized: None Level of assistance: Complete Independence Comments: slow cadence, mild antalgic  TREATMENT DATE:  [facility] Adult PT Treatment:                                                DATE: 01/04/24 Therapeutic Exercise: Nustep L4 8 min 30s STS 12 reps L piriformis stretch figure 4 w/towel 30s x2 Manual Therapy: L piriformis release 2 min R SI assessment negative stork sign   Therapeutic Activity: Seated hamstring stretch  30sx2 B Supine L piriformis stretch 30s  Supine hip fallouts GTB 15x B, 15/15 unilaterally Bridge against GTB 15x Bridge with ball 15x S/L clams GTB 15/15  [facility] Adult PT Treatment:                                                DATE:  12/27/23 Therapeutic Exercise: Nustep L2 8 min LTR Active HS stretch in supine Supine hip flexor + SKTC edge of table Bridge on ball - 2-3'' hold - 2x10 Piriformis stretch Hip adduction with pilates ring - 5'' 2x10 S/L clam - 2x10 STS - 2x10 - 10#  [facility] Adult PT Treatment:                                                DATE: 12/25/23 Therapeutic Exercise: Nustep L2 8 min Manual Therapy: L piriformis release 2 min R SI MET to correct anterior ilial rotation  Therapeutic Activity: Seated hamstring stretch 30sx2 B Supine L piriformis stretch 30s  Supine hip fallouts RTB 15x B, 15/15 unilaterally  [facility] Adult PT Treatment:                                                DATE: 12/13/23 Eval and HEP Self Care: Additional minutes spent for educating on updated Therapeutic Home Exercise Program as well as comparing current status to condition at start of symptoms. This included exercises focusing on stretching, strengthening, with focus on eccentric aspects. Long term goals include an improvement in range of motion, strength, endurance as well as avoiding reinjury. Patient's frequency would include in 1-2 times a day, 3-5 times a week for a duration of 6-12 weeks. Proper technique shown and discussed handout in great detail. All questions were discussed and addressed.                                                                                                                                 PATIENT EDUCATION:  Education details: Discussed eval findings, rehab rationale and POC and patient is in agreement  Person educated: Patient Education method: Explanation Education comprehension: verbalized understanding and needs further education  HOME EXERCISE PROGRAM: Access Code: [email or web address] URL: [email or web address] Date: 12/13/2023 Prepared by: Gretta Leavens  Exercises - Static Prone on Elbows  - 2-3 x daily - 5 x weekly - 1 sets - 1 reps - 2 min hold - Seated Piriformis  Stretch with Trunk Bend  - 2-3 x daily - 5 x weekly - 1 sets - 2 reps - 30s hold -  Hip Flexor Stretch at Edge of Bed  - 2-3 x daily - 5 x weekly - 1 sets - 2 reps - 30s hold  ASSESSMENT:  CLINICAL IMPRESSION: Pain continues to improve. Pain goal met.  Negative stork sign today.  L piriformis much less tender to palpation.  Minimal symptom reproduction with stretching tasks.    Patient is a 78 y.o. male who was seen today for physical therapy evaluation and treatment for SI dysfunction.  Patient presents with R leg length discrepancy and positive R stork sig indicating and anterior R ilial rotation with subsequent L SI and piriformis irritation.  OBJECTIVE IMPAIRMENTS: Abnormal gait, decreased activity tolerance, decreased knowledge of condition, decreased mobility, increased fascial restrictions, increased muscle spasms, postural dysfunction, and pain.   ACTIVITY LIMITATIONS: carrying, lifting, bending, sitting, standing, squatting, sleeping, and stairs  PERSONAL FACTORS: Age, Fitness, Past/current experiences, and Time since onset of injury/illness/exacerbation are also affecting patient's functional outcome.   REHAB POTENTIAL: good  CLINICAL DECISION MAKING: Evolving/moderate complexity  EVALUATION COMPLEXITY: Moderate   GOALS: Goals reviewed with patient? No    SHORT TERM GOALS=LONG TERM GOALS: Target date: 01/24/2024  Patient will acknowledge 6/10 pain at least once during episode of care   Baseline: 9/10; 01/04/24 5/10 Goal status: Met  2.  Patient will score at least 50% on LEFS to signify clinically meaningful improvement in functional abilities.   Baseline: 20/80 25% functional Goal status: INITIAL  3.  Patient will increase 30s chair stand reps from 2 to 8 with/without arms to demonstrate and improved functional ability with less pain/difficulty as well as reduce fall risk.  Baseline: 2 Goal status: INITIAL  4.  Patient to demonstrate independence in HEP  Baseline:  [id number] Goal status: INITIAL  5.  Resolve stork sign R/SI dysfunction Baseline: positive R stork sign Goal status: INITIAL  6.  Moderate L piriformis irritation Baseline: Severe L piriformis irritation Goal status: INITIAL  PLAN:  PT FREQUENCY: 1-2x/week  PT DURATION: 6 weeks  PLANNED INTERVENTIONS: 97110-Therapeutic exercises, 97530- Therapeutic activity, 97112- Neuromuscular re-education, 97535- Self Care, [id number]- Manual therapy, [phone]- Gait training, Patient/Family education, Balance training, Stair training, Dry Needling, Joint mobilization, and Spinal mobilization.  PLAN FOR NEXT SESSION: HEP review and update, manual techniques as appropriate, aerobic tasks, ROM and flexibility activities, strengthening and PREs, TPDN, gait and balance training as needed    Date of referral: 11/28/23 Referring provider: Genetta Kenning, MD Referring diagnosis? 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 Unspecified  Nature of Condition: Chronic (continuous duration > 3 months)   Laterality: Lt  Current Functional Measure Score: LEFS  Objective measurements identify impairments when they are compared to normal values, the uninvolved extremity, and prior level of function.  [x]  Yes  []  No  Objective assessment of functional ability: Severe functional limitations   Briefly describe symptoms: Chief complaint left-sided buttock pain  How did symptoms start: undetermined  Average pain intensity:  Last 24 hours: 9  Past week: 8  How often does the pt experience symptoms? Constantly  How much have the symptoms interfered with usual daily activities? Quite a bit  How has condition changed since care began at this facility? NA - initial visit  In general, how is the patients overall health? Good   BACK PAIN (STarT Back Screening  Tool) No   Eldon Greenland, PT 01/04/2024, 4:09 PM

## 2024-01-04 ENCOUNTER — Ambulatory Visit

## 2024-01-04 DIAGNOSIS — R2689 Other abnormalities of gait and mobility: Secondary | ICD-10-CM | POA: Diagnosis not present

## 2024-01-04 DIAGNOSIS — M6281 Muscle weakness (generalized): Secondary | ICD-10-CM

## 2024-01-04 DIAGNOSIS — M533 Sacrococcygeal disorders, not elsewhere classified: Secondary | ICD-10-CM | POA: Diagnosis not present

## 2024-01-08 NOTE — Therapy (Signed)
 OUTPATIENT PHYSICAL THERAPY DAILY NOTE   Patient Name: Darrell Baldwin MRN: [id number] DOB:[birth year], 78 y.o., male Today's Date: 01/09/2024  END OF SESSION:  PT End of Session - 01/09/24 1526     Visit Number 5    Number of Visits 8    Date for PT Re-Evaluation 02/12/24    Authorization Type [employer]    PT Start Time 1525    PT Stop Time 1605    PT Time Calculation (min) 40 min    Activity Tolerance Patient tolerated treatment well    Behavior During Therapy [facility] for tasks assessed/performed             Past Medical History:  Diagnosis Date   Allergic rhinitis    Hypertension    Past Surgical History:  Procedure Laterality Date   COLONOSCOPY  04/11/2014   per Dr. 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   Statin intolerance 08/13/2019   Pain in left finger(s) 09/18/2018   Dyslipidemia 09/13/2018   OSA (obstructive sleep apnea) 10/17/2017   Seasonal allergies 05/29/2017   Insomnia 10/25/2016   Chronic neck pain 08/04/2016   Low back pain with left-sided sciatica 12/24/2014   Erectile dysfunction 08/15/2013   Environmental allergies 04/25/2013   HTN (hypertension) 04/02/2013    PCP: Donley Furth, MD   REFERRING PROVIDER: Genetta Kenning, MD  REFERRING DIAG: M53.3 (ICD-10-CM) - Sacroiliac dysfunction  Rationale for Evaluation and Treatment: Rehabilitation  THERAPY DIAG:  Sacroiliac joint dysfunction of left side  Other abnormalities of gait and mobility  Muscle weakness (generalized)  ONSET DATE: 06/18/23  SUBJECTIVE:                                                                                                                                                                                           SUBJECTIVE STATEMENT: Symptoms minimal during the  day while he is moving, 2/10.  Symptoms increase to 5/10 at night and day end.  PERTINENT HISTORY:  Left buttock pain he has 2 positive provocative maneuvers suggesting sacroiliac disorder.  Imaging studies (images reviewed)  reveal impressive facet arthropathy bilateral L 4 5, L5-S1.  In addition patient likely has a muscular component to his pain as well. Send physical therapy and encouraged him to follow through on this.  Will see him back in a month. Will reassess, consider sacroiliac injections versus medial branch blocks on the left side Discussed with  patient and wife went over spine model with the patient and wife.  PAIN:  Are you having pain? Yes: NPRS scale: 9/10 Pain location: L SI region Pain description: sharp Aggravating factors: walking, prolonged standing/sitting Relieving factors: rest  PRECAUTIONS: None  RED FLAGS: None   WEIGHT BEARING RESTRICTIONS: No  FALLS:  Has patient fallen in last 6 months? No   OCCUPATION: retired  PLOF: Independent  PATIENT GOALS: To manage my low back/hip pain  NEXT MD VISIT: 12/26/23  OBJECTIVE:  Note: Objective measures were completed at Evaluation unless otherwise noted.  DIAGNOSTIC FINDINGS:  FINDINGS: Pelvic ring is intact. No acute fracture or dislocation is noted. Mild degenerative changes of the hip joints are noted bilaterally. No soft tissue changes are seen.   IMPRESSION: Degenerative change without acute abnormality.     Electronically Signed   By: Violeta Grey M.D.   On: 11/25/2023 21:18  FINDINGS: Pelvic ring is intact. No acute fracture or dislocation is noted. Mild degenerative changes of the hip joints are noted bilaterally. No soft tissue changes are seen.   IMPRESSION: Degenerative change without acute abnormality.     Electronically Signed   By: Violeta Grey M.D.   On: 11/25/2023 21:18  PATIENT SURVEYS:  LEFS 20/80 25% functional  MUSCLE LENGTH: Hamstrings: Right 80 deg; Left 80  deg Thomas test: Right mild restriction and L SI discomfort  POSTURE: flexed trunk   PALPATION: TTP L piriformis  LUMBAR ROM:   AROM eval  Flexion   Extension   Right lateral flexion   Left lateral flexion   Right rotation   Left rotation    (Blank rows = not tested)  LOWER EXTREMITY ROM:   WFL  Active  Right eval Left eval  Hip flexion    Hip extension    Hip abduction    Hip adduction    Hip internal rotation    Hip external rotation    Knee flexion    Knee extension    Ankle dorsiflexion    Ankle plantarflexion    Ankle inversion    Ankle eversion     (Blank rows = not tested)  LOWER EXTREMITY MMT:    MMT Right eval Left eval  Hip flexion    Hip extension    Hip abduction    Hip adduction    Hip internal rotation    Hip external rotation    Knee flexion    Knee extension    Ankle dorsiflexion    Ankle plantarflexion    Ankle inversion    Ankle eversion     (Blank rows = not tested)  LUMBAR SPECIAL TESTS:  Straight leg raise test: Negative, Single leg stance test: Negative, Stork standing: Negative, SI Compression/distraction test: Negative, FABER test: Negative, and Thomas test: Negative positive stork sign on R  FUNCTIONAL TESTS:  30 seconds chair stand test 2  GAIT: Distance walked: 60ft x2 Assistive device utilized: None Level of assistance: Complete Independence Comments: slow cadence, mild antalgic  TREATMENT DATE:  [facility] Adult PT Treatment:                                                DATE: 01/09/24 Therapeutic Exercise: Nustep L5 8 min L piriformis stretch figure 4 w/towel 30s x2 Prone on elbows 2 min Manual Therapy: L piriformis release 2 min PT assisted L  abd in S/L maintaining extension 3x10 Prone press with PT OP 10x Therapeutic Activity: Supine hip fallouts BluTB 10x B, 10/10 unilaterally Bridge against BluTB 10x S/L clams BluTB 10/10  [facility] Adult PT Treatment:                                                DATE:  01/04/24 Therapeutic Exercise: Nustep L4 8 min 30s STS 12 reps L piriformis stretch figure 4 w/towel 30s x2 Manual Therapy: L piriformis release 2 min R SI assessment negative stork sign   Therapeutic Activity: Seated hamstring stretch 30sx2 B Supine L piriformis stretch 30s  Supine hip fallouts GTB 15x B, 15/15 unilaterally Bridge against GTB 15x Bridge with ball 15x S/L clams GTB 15/15  [facility] Adult PT Treatment:                                                DATE: 12/27/23 Therapeutic Exercise: Nustep L2 8 min LTR Active HS stretch in supine Supine hip flexor + SKTC edge of table Bridge on ball - 2-3'' hold - 2x10 Piriformis stretch Hip adduction with pilates ring - 5'' 2x10 S/L clam - 2x10 STS - 2x10 - 10#  [facility] Adult PT Treatment:                                                DATE: 12/25/23 Therapeutic Exercise: Nustep L2 8 min Manual Therapy: L piriformis release 2 min R SI MET to correct anterior ilial rotation  Therapeutic Activity: Seated hamstring stretch 30sx2 B Supine L piriformis stretch 30s  Supine hip fallouts RTB 15x B, 15/15 unilaterally  [facility] Adult PT Treatment:                                                DATE: 12/13/23 Eval and HEP Self Care: Additional minutes spent for educating on updated Therapeutic Home Exercise Program as well as comparing current status to condition at start of symptoms. This included exercises focusing on stretching, strengthening, with focus on eccentric aspects. Long term goals include an improvement in range of motion, strength, endurance as well as avoiding reinjury. Patient's frequency would include in 1-2 times a day, 3-5 times a week for a duration of 6-12 weeks. Proper technique shown and discussed handout in great detail. All questions were discussed and addressed.  PATIENT EDUCATION:  Education  details: Discussed eval findings, rehab rationale and POC and patient is in agreement  Person educated: Patient Education method: Explanation Education comprehension: verbalized understanding and needs further education  HOME EXERCISE PROGRAM: Access Code: [email or web address] URL: [email or web address] Date: 01/09/2024 Prepared by: Gretta Leavens  Exercises - Static Prone on Elbows  - 2-3 x daily - 5 x weekly - 1 sets - 1 reps - 2 min hold - Hip Flexor Stretch at Edge of Bed  - 2-3 x daily - 5 x weekly - 1 sets - 2 reps - 30s hold - Supine Figure 4 Piriformis Stretch  - 2-3 x daily - 5 x weekly - 1 sets - 30s hold  ASSESSMENT:  CLINICAL IMPRESSION: Pain levels decreasing.  Able to advance to more aggressive tasks including prone lie and prone press.  Incorporated abdominal and core work for stability.  Good tolerance to and ROM exhibited with prone press ups.   Patient is a 78 y.o. male who was seen today for physical therapy evaluation and treatment for SI dysfunction.  Patient presents with R leg length discrepancy and positive R stork sig indicating and anterior R ilial rotation with subsequent L SI and piriformis irritation.  OBJECTIVE IMPAIRMENTS: Abnormal gait, decreased activity tolerance, decreased knowledge of condition, decreased mobility, increased fascial restrictions, increased muscle spasms, postural dysfunction, and pain.   ACTIVITY LIMITATIONS: carrying, lifting, bending, sitting, standing, squatting, sleeping, and stairs  PERSONAL FACTORS: Age, Fitness, Past/current experiences, and Time since onset of injury/illness/exacerbation are also affecting patient's functional outcome.   REHAB POTENTIAL: good  CLINICAL DECISION MAKING: Evolving/moderate complexity  EVALUATION COMPLEXITY: Moderate   GOALS: Goals reviewed with patient? No    SHORT TERM GOALS=LONG TERM GOALS: Target date: 01/24/2024  Patient will acknowledge 6/10 pain at least once during episode of  care   Baseline: 9/10; 01/04/24 5/10 Goal status: Met  2.  Patient will score at least 50% on LEFS to signify clinically meaningful improvement in functional abilities.   Baseline: 20/80 25% functional Goal status: INITIAL  3.  Patient will increase 30s chair stand reps from 2 to 8 with/without arms to demonstrate and improved functional ability with less pain/difficulty as well as reduce fall risk.  Baseline: 2 Goal status: INITIAL  4.  Patient to demonstrate independence in HEP  Baseline: [id number] Goal status: INITIAL  5.  Resolve stork sign R/SI dysfunction Baseline: positive R stork sign Goal status: INITIAL  6.  Moderate L piriformis irritation Baseline: Severe L piriformis irritation Goal status: INITIAL  PLAN:  PT FREQUENCY: 1-2x/week  PT DURATION: 6 weeks  PLANNED INTERVENTIONS: 97110-Therapeutic exercises, 97530- Therapeutic activity, 97112- Neuromuscular re-education, 97535- Self Care, 47829- Manual therapy, [phone]- Gait training, Patient/Family education, Balance training, Stair training, Dry Needling, Joint mobilization, and Spinal mobilization.  PLAN FOR NEXT SESSION: HEP review and update, manual techniques as appropriate, aerobic tasks, ROM and flexibility activities, strengthening and PREs, TPDN, gait and balance training as needed    Date of referral: 11/28/23 Referring provider: Genetta Kenning, MD Referring diagnosis? 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 Unspecified  Nature of Condition: Chronic (continuous duration > 3 months)   Laterality: Lt  Current Functional Measure Score: LEFS  Objective measurements identify impairments when they are compared to normal values, the uninvolved extremity, and prior level of function.  [x]  Yes  []  No  Objective assessment of functional ability: Severe  functional  limitations   Briefly describe symptoms: Chief complaint left-sided buttock pain  How did symptoms start: undetermined  Average pain intensity:  Last 24 hours: 9  Past week: 8  How often does the pt experience symptoms? Constantly  How much have the symptoms interfered with usual daily activities? Quite a bit  How has condition changed since care began at this facility? NA - initial visit  In general, how is the patients overall health? Good   BACK PAIN (STarT Back Screening Tool) No   Eldon Greenland, PT 01/09/2024, 4:22 PM

## 2024-01-09 ENCOUNTER — Ambulatory Visit

## 2024-01-09 DIAGNOSIS — R2689 Other abnormalities of gait and mobility: Secondary | ICD-10-CM

## 2024-01-09 DIAGNOSIS — M6281 Muscle weakness (generalized): Secondary | ICD-10-CM

## 2024-01-09 DIAGNOSIS — M533 Sacrococcygeal disorders, not elsewhere classified: Secondary | ICD-10-CM | POA: Diagnosis not present

## 2024-01-11 ENCOUNTER — Encounter: Admitting: Physical Medicine & Rehabilitation

## 2024-01-11 ENCOUNTER — Ambulatory Visit

## 2024-01-11 NOTE — Therapy (Incomplete)
 OUTPATIENT PHYSICAL THERAPY DAILY NOTE   Patient Name: Darrell Baldwin MRN: [id number] DOB:Apr 20, 1946, 78 y.o., male Today's Date: 01/11/2024  END OF SESSION:       Past Medical History:  Diagnosis Date   Allergic rhinitis    Hypertension    Past Surgical History:  Procedure Laterality Date   COLONOSCOPY  04/11/2014   per Dr. 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  ONSET DATE: 06/18/23  SUBJECTIVE:                                                                                                                                                                                           SUBJECTIVE STATEMENT: Symptoms minimal during the day while he is moving, 2/10.  Symptoms increase to 5/10 at night and day end.  PERTINENT HISTORY:  Left buttock pain he has 2 positive provocative maneuvers suggesting sacroiliac disorder.  Imaging studies (images reviewed)  reveal impressive facet arthropathy bilateral L 4 5, L5-S1.  In addition patient likely has a muscular component to his pain as well. Send physical therapy and encouraged him to follow through on this.  Will see him back  in a month. Will reassess, consider sacroiliac injections versus medial branch blocks on the left side Discussed with patient and wife went over spine model with the patient and wife.  PAIN:  Are you having pain? Yes: NPRS scale: 9/10 Pain location: L SI region Pain description: sharp Aggravating factors: walking, prolonged standing/sitting Relieving factors: rest  PRECAUTIONS: None  RED FLAGS: None   WEIGHT BEARING RESTRICTIONS: No  FALLS:  Has patient fallen in last 6 months? No   OCCUPATION: retired  PLOF: Independent  PATIENT GOALS: To manage my low back/hip pain  NEXT MD VISIT: 12/26/23  OBJECTIVE:  Note: Objective measures were completed at Evaluation unless otherwise noted.  DIAGNOSTIC FINDINGS:  FINDINGS: Pelvic ring is intact. No acute fracture or dislocation is noted. Mild degenerative changes of the hip joints are noted bilaterally. No soft tissue changes are seen.   IMPRESSION: Degenerative change without acute abnormality.     Electronically Signed   By: Violeta Grey M.D.   On: 11/25/2023 21:18  FINDINGS: Pelvic ring is intact. No acute fracture or dislocation is noted. Mild degenerative changes of the hip joints are noted bilaterally. No soft tissue changes are seen.   IMPRESSION: Degenerative change without acute abnormality.     Electronically Signed   By: Violeta Grey M.D.   On: 11/25/2023 21:18  PATIENT SURVEYS:  LEFS 20/80 25% functional  MUSCLE LENGTH: Hamstrings: Right 80 deg; Left 80 deg Thomas test: Right mild restriction and L SI discomfort  POSTURE: flexed trunk   PALPATION: TTP L piriformis  LUMBAR ROM:   AROM eval  Flexion   Extension   Right lateral flexion   Left lateral flexion   Right rotation   Left rotation    (Blank rows = not tested)  LOWER EXTREMITY ROM:   WFL  Active  Right eval Left eval  Hip flexion    Hip extension    Hip abduction    Hip adduction    Hip internal rotation    Hip  external rotation    Knee flexion    Knee extension    Ankle dorsiflexion    Ankle plantarflexion    Ankle inversion    Ankle eversion     (Blank rows = not tested)  LOWER EXTREMITY MMT:    MMT Right eval Left eval  Hip flexion    Hip extension    Hip abduction    Hip adduction    Hip internal rotation    Hip external rotation    Knee flexion    Knee extension    Ankle dorsiflexion    Ankle plantarflexion    Ankle inversion    Ankle eversion     (Blank rows = not tested)  LUMBAR SPECIAL TESTS:  Straight leg raise test: Negative, Single leg stance test: Negative, Stork standing: Negative, SI Compression/distraction test: Negative, FABER test: Negative, and Thomas test: Negative positive stork sign on R  FUNCTIONAL TESTS:  30 seconds chair stand test 2  GAIT: Distance walked: 32ft x2 Assistive device utilized: None Level of assistance: Complete Independence Comments: slow cadence, mild antalgic  TREATMENT DATE:  [facility] Adult PT Treatment:                                                DATE: 01/09/24 Therapeutic Exercise: Nustep L5 8 min L piriformis stretch figure 4 w/towel 30s x2 Prone on elbows 2 min Manual Therapy: L piriformis release 2 min PT assisted L abd in S/L maintaining extension 3x10 Prone press with PT OP 10x Therapeutic Activity: Supine hip fallouts BluTB 10x B, 10/10 unilaterally Bridge against BluTB 10x S/L clams BluTB 10/10  [facility] Adult PT Treatment:                                                DATE: 01/04/24 Therapeutic Exercise: Nustep L4 8 min 30s STS 12 reps L piriformis stretch figure  4 w/towel 30s x2 Manual Therapy: L piriformis release 2 min R SI assessment negative stork sign   Therapeutic Activity: Seated hamstring stretch 30sx2 B Supine L piriformis stretch 30s  Supine hip fallouts GTB 15x B, 15/15 unilaterally Bridge against GTB 15x Bridge with ball 15x S/L clams GTB 15/15  [facility] Adult PT Treatment:                                                 DATE: 12/27/23 Therapeutic Exercise: Nustep L2 8 min LTR Active HS stretch in supine Supine hip flexor + SKTC edge of table Bridge on ball - 2-3'' hold - 2x10 Piriformis stretch Hip adduction with pilates ring - 5'' 2x10 S/L clam - 2x10 STS - 2x10 - 10#  [facility] Adult PT Treatment:                                                DATE: 12/25/23 Therapeutic Exercise: Nustep L2 8 min Manual Therapy: L piriformis release 2 min R SI MET to correct anterior ilial rotation  Therapeutic Activity: Seated hamstring stretch 30sx2 B Supine L piriformis stretch 30s  Supine hip fallouts RTB 15x B, 15/15 unilaterally  [facility] Adult PT Treatment:                                                DATE: 12/13/23 Eval and HEP Self Care: Additional minutes spent for educating on updated Therapeutic Home Exercise Program as well as comparing current status to condition at start of symptoms. This included exercises focusing on stretching, strengthening, with focus on eccentric aspects. Long term goals include an improvement in range of motion, strength, endurance as well as avoiding reinjury. Patient's frequency would include in 1-2 times a day, 3-5 times a week for a duration of 6-12 weeks. Proper technique shown and discussed handout in great detail. All questions were discussed and addressed.                                                                                                                                 PATIENT EDUCATION:  Education details: Discussed eval findings, rehab rationale and POC and patient is in agreement  Person educated: Patient Education method: Explanation Education comprehension: verbalized understanding and needs further education  HOME EXERCISE PROGRAM: Access Code: [email or web address] URL: [email or web address] Date: 01/09/2024 Prepared by: Gretta Leavens  Exercises - Static Prone on Elbows  - 2-3 x daily - 5 x weekly - 1 sets - 1 reps - 2 min  hold - Hip  Flexor Stretch at [place] of Bed  - 2-3 x daily - 5 x weekly - 1 sets - 2 reps - 30s hold - Supine Figure 4 Piriformis Stretch  - 2-3 x daily - 5 x weekly - 1 sets - 30s hold  ASSESSMENT:  CLINICAL IMPRESSION: Pain levels decreasing.  Able to advance to more aggressive tasks including prone lie and prone press.  Incorporated abdominal and core work for stability.  Good tolerance to and ROM exhibited with prone press ups.   Patient is a 78 y.o. male who was seen today for physical therapy evaluation and treatment for SI dysfunction.  Patient presents with R leg length discrepancy and positive R stork sig indicating and anterior R ilial rotation with subsequent L SI and piriformis irritation.  OBJECTIVE IMPAIRMENTS: Abnormal gait, decreased activity tolerance, decreased knowledge of condition, decreased mobility, increased fascial restrictions, increased muscle spasms, postural dysfunction, and pain.   ACTIVITY LIMITATIONS: carrying, lifting, bending, sitting, standing, squatting, sleeping, and stairs  PERSONAL FACTORS: Age, Fitness, Past/current experiences, and Time since onset of injury/illness/exacerbation are also affecting patient's functional outcome.   REHAB POTENTIAL: good  CLINICAL DECISION MAKING: Evolving/moderate complexity  EVALUATION COMPLEXITY: Moderate   GOALS: Goals reviewed with patient? No    SHORT TERM GOALS=LONG TERM GOALS: Target date: 01/24/2024  Patient will acknowledge 6/10 pain at least once during episode of care   Baseline: 9/10; 01/04/24 5/10 Goal status: Met  2.  Patient will score at least 50% on LEFS to signify clinically meaningful improvement in functional abilities.   Baseline: 20/80 25% functional Goal status: INITIAL  3.  Patient will increase 30s chair stand reps from 2 to 8 with/without arms to demonstrate and improved functional ability with less pain/difficulty as well as reduce fall risk.  Baseline: 2 Goal status: INITIAL  4.   Patient to demonstrate independence in HEP  Baseline: [id number] Goal status: INITIAL  5.  Resolve stork sign R/SI dysfunction Baseline: positive R stork sign Goal status: INITIAL  6.  Moderate L piriformis irritation Baseline: Severe L piriformis irritation Goal status: INITIAL  PLAN:  PT FREQUENCY: 1-2x/week  PT DURATION: 6 weeks  PLANNED INTERVENTIONS: 97110-Therapeutic exercises, 97530- Therapeutic activity, 97112- Neuromuscular re-education, 97535- Self Care, 62952- Manual therapy, [phone]- Gait training, Patient/Family education, Balance training, Stair training, Dry Needling, Joint mobilization, and Spinal mobilization.  PLAN FOR NEXT SESSION: HEP review and update, manual techniques as appropriate, aerobic tasks, ROM and flexibility activities, strengthening and PREs, TPDN, gait and balance training as needed    Date of referral: 11/28/23 Referring provider: Genetta Kenning, MD Referring diagnosis? 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 Unspecified  Nature of Condition: Chronic (continuous duration > 3 months)   Laterality: Lt  Current Functional Measure Score: LEFS  Objective measurements identify impairments when they are compared to normal values, the uninvolved extremity, and prior level of function.  [x]  Yes  []  No  Objective assessment of functional ability: Severe functional limitations   Briefly describe symptoms: Chief complaint left-sided buttock pain  How did symptoms start: undetermined  Average pain intensity:  Last 24 hours: 9  Past week: 8  How often does the pt experience symptoms? Constantly  How much have the symptoms interfered with usual daily activities? Quite a bit  How has condition changed since care began at this facility? NA - initial visit  In general, how is the patients  overall  health? Good   BACK PAIN (STarT Back Screening Tool) No   Ivor Mars, PT 01/11/2024, 10:58 AM

## 2024-01-12 ENCOUNTER — Telehealth: Payer: Self-pay

## 2024-01-12 NOTE — Telephone Encounter (Signed)
 PT called and left voicemail regarding missed visit on 01/11/2024. This is 2nd no show, we will schedule one visit at a time if he calls back to reschedule.   Ivor Mars , PT, DPT 01/12/24 7:53 AM

## 2024-01-23 ENCOUNTER — Encounter: Attending: Physical Medicine & Rehabilitation | Admitting: Physical Medicine & Rehabilitation

## 2024-01-23 ENCOUNTER — Encounter: Payer: Self-pay | Admitting: Physical Medicine & Rehabilitation

## 2024-01-23 VITALS — BP 170/90 | HR 80 | Ht 67.0 in | Wt 169.0 lb

## 2024-01-23 DIAGNOSIS — M5416 Radiculopathy, lumbar region: Secondary | ICD-10-CM | POA: Diagnosis not present

## 2024-01-23 DIAGNOSIS — M48061 Spinal stenosis, lumbar region without neurogenic claudication: Secondary | ICD-10-CM | POA: Insufficient documentation

## 2024-01-23 NOTE — Progress Notes (Signed)
 Subjective:    Patient ID: Darrell Baldwin, male    DOB: 02-23-1946, 78 y.o.   MRN: [id number] 78 year old male with chronic lumbar pain also has chronic buttock pain this has been going on for close to 1 year.  His original injury was while walking briskly hearing a pop around his buttock area. He was seen by orthopedics, had a lumbar MRI demonstrating facet arthropathy as well as foraminal stenosis L4-5 bilaterally as well as central stenosis L5-S1 causing S1 nerve root impingement. HPI Chief complaint left greater than right buttock pain He denies any numbness or tingling in the lower limb but does have intermittent pain that goes further down the leg.  His wife states he does not complain to her much but she can see that he is in pain because he limits his activities He has gone through physical therapy and has not made much progress.  We reviewed his MRI from October 2024 and I reviewed the actual images as well.  He does have multi level foraminal stenosis in the lower lumbar region.  The patient states that he can walk about 100 feet before his symptoms occur and they diminish with rest Pain Inventory Average Pain 9 Pain Right Now 6 My pain is sharp, burning, stabbing, and aching  In the last 24 hours, has pain interfered with the following? General activity 3 Relation with others 0 Enjoyment of life 1 What TIME of day is your pain at its worst? evening Sleep (in general) Poor  Pain is worse with: walking, bending, and some activites Pain improves with: . Relief from Meds: .  Family History  Problem Relation Age of Onset   Breast cancer Sister    Diabetes Sister    Lung cancer Brother    Liver cancer Sister    Diabetes Sister    Colon cancer Neg Hx    Social History   Socioeconomic History   Marital status: Married    Spouse name: Not on file   Number of children: Not on file   Years of education: Not on file   Highest education level: Not on file  Occupational  History   Not on file  Tobacco Use   Smoking status: Never   Smokeless tobacco: Never  Vaping Use   Vaping status: Never Used  Substance and Sexual Activity   Alcohol use: No    Alcohol/week: 0.0 standard drinks of alcohol   Drug use: No   Sexual activity: Not on file  Other Topics Concern   Not on file  Social History Narrative   Not on file   Social Drivers of Health   Financial Resource Strain: Low Risk  (08/15/2022)   Overall Financial Resource Strain (CARDIA)    Difficulty of Paying Living Expenses: Not hard at all  Food Insecurity: No Food Insecurity (08/15/2022)   Hunger Vital Sign    Worried About Running Out of Food in the Last Year: Never true    Ran Out of Food in the Last Year: Never true  Transportation Needs: No Transportation Needs (08/15/2022)   PRAPARE - Administrator, Civil Service (Medical): No    Lack of Transportation (Non-Medical): No  Physical Activity: Sufficiently Active (08/15/2022)   Exercise Vital Sign    Days of Exercise per Week: 7 days    Minutes of Exercise per Session: 30 min  Stress: No Stress Concern Present (08/15/2022)   [employer] of Occupational Health - Occupational Stress Questionnaire  Feeling of Stress : Not at all  Social Connections: Socially Integrated (08/15/2022)   Social Connection and Isolation Panel    Frequency of Communication with Friends and Family: More than three times a week    Frequency of Social Gatherings with Friends and Family: More than three times a week    Attends Religious Services: More than 4 times per year    Active Member of Clubs or Organizations: Yes    Attends Banker Meetings: 1 to 4 times per year    Marital Status: Married   Past Surgical History:  Procedure Laterality Date   COLONOSCOPY  04/11/2014   per Dr. Obie, diverticulosis and internal hemorrhoids, no polyps, repeat in 10 yrs    Past Surgical History:  Procedure Laterality Date   COLONOSCOPY   04/11/2014   per Dr. Obie, diverticulosis and internal hemorrhoids, no polyps, repeat in 10 yrs    Past Medical History:  Diagnosis Date   Allergic rhinitis    Hypertension    BP (!) 170/90   Pulse 80   Ht 5' 7 (1.702 m)   Wt 169 lb (76.7 kg)   SpO2 96%   BMI 26.47 kg/m   Opioid Risk Score:   Fall Risk Score:  `1  Depression screen [facility] 2/9     11/28/2023    2:38 PM 09/12/2023   11:04 AM 04/04/2023    3:45 PM 03/06/2023    3:34 PM 08/15/2022    3:34 PM 08/03/2022   11:13 AM 04/04/2022   10:25 AM  Depression screen PHQ 2/9  Decreased Interest 0 0 0 0 0 0 3  Down, Depressed, Hopeless 0 0 0 0 0 0 0  PHQ - 2 Score 0 0 0 0 0 0 3  Altered sleeping 0 0 0 0 0 0 0  Tired, decreased energy 0 0 0 0 0 0 0  Change in appetite 0 0 0 0 0 0 0  Feeling bad or failure about yourself  0 0 0 0 0 0 0  Trouble concentrating 0 0 0 0 0 0 0  Moving slowly or fidgety/restless 0 0 0 0 0 0 0  Suicidal thoughts 0 0 0 0 0 0 0  PHQ-9 Score 0 0 0 0 0 0 3  Difficult doing work/chores  Not difficult at all Not difficult at all Not difficult at all Not difficult at all Not difficult at all Not difficult at all    Review of Systems  Musculoskeletal:        Left bursa pain  All other systems reviewed and are negative.     Objective:   Physical Exam  General No acute distress Mood affect appropriate Extremities without edema Negative straight leg raising bilaterally Sensation intact to light touch bilateral lower limbs There is no tenderness palpation lumbar paraspinal area is lumbar range of motion is normal. Sacroiliac testing: Sacral thrust (prone) : Negative Lateral compression: Negative FABER's: Negative Distraction (supine): Negative Thigh thrust test: Negative  Ambulates without assistive device no evidence of toe drag or instability      Assessment & Plan:   1.  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  He has  failed trigger point injection as well as physical therapy will do left-sided L4-5 L5-S1 transforaminal lumbar epidural steroid injections under fluoroscopic guidance.

## 2024-02-06 ENCOUNTER — Ambulatory Visit: Admitting: Physical Medicine & Rehabilitation

## 2024-02-08 ENCOUNTER — Ambulatory Visit: Admitting: Physical Medicine & Rehabilitation

## 2024-02-15 ENCOUNTER — Encounter (HOSPITAL_BASED_OUTPATIENT_CLINIC_OR_DEPARTMENT_OTHER): Admitting: Physical Medicine & Rehabilitation

## 2024-02-15 ENCOUNTER — Encounter: Payer: Self-pay | Admitting: Physical Medicine & Rehabilitation

## 2024-02-15 VITALS — BP 168/110 | HR 69 | Temp 98.3°F | Ht 67.0 in | Wt 168.0 lb

## 2024-02-15 DIAGNOSIS — M5416 Radiculopathy, lumbar region: Secondary | ICD-10-CM

## 2024-02-15 DIAGNOSIS — M48061 Spinal stenosis, lumbar region without neurogenic claudication: Secondary | ICD-10-CM | POA: Diagnosis not present

## 2024-02-15 MED ORDER — LIDOCAINE HCL (PF) 1 % IJ SOLN
4.0000 mL | Freq: Once | INTRAMUSCULAR | Status: AC
Start: 2024-02-15 — End: 2024-02-15
  Administered 2024-02-15: 4 mL

## 2024-02-15 MED ORDER — IOHEXOL 180 MG/ML  SOLN
3.0000 mL | Freq: Once | INTRAMUSCULAR | Status: AC
  Administered 2024-02-15: 3 mL

## 2024-02-15 MED ORDER — DEXAMETHASONE SODIUM PHOSPHATE 10 MG/ML IJ SOLN
20.0000 mg | Freq: Once | INTRAMUSCULAR | Status: AC
Start: 2024-02-15 — End: 2024-02-15
  Administered 2024-02-15: 20 mg

## 2024-02-15 MED ORDER — LIDOCAINE HCL 1 % IJ SOLN
5.0000 mL | Freq: Once | INTRAMUSCULAR | Status: AC
Start: 2024-02-15 — End: 2024-02-15
  Administered 2024-02-15: 5 mL

## 2024-02-15 NOTE — Progress Notes (Signed)
  PROCEDURE RECORD [facility]   Name: Darrell Baldwin DOB:[birth year] MRN: [id number]  Date:02/15/2024  Physician:     Nurse/CMA: Tye Kitty MA  Allergies:  Allergies  Allergen Reactions   Statins     Muscle cramps    Consent Signed: Yes.    Is patient diabetic? No.  CBG today? N/A  Pregnant: No. LMP: No LMP for male patient. (age 77-55)  Anticoagulants: no Anti-inflammatory: no Antibiotics: no  Procedure: Left L4-5, L5-S1 Trans Lesi  Position: Prone Start Time: 10:26 AM  End Time: 10:38 AM  Fluoro Time: 1:08  RN/CMA Janayla Marik MA Hadley Soileau MA    Time 10:09 AM 10:39 AM    BP 178/90 173/84    Pulse 69 70    Respirations 14 14    O2 Sat 98 98    S/S 6 6    Pain Level 6/10 0/10     D/C home with Wife, patient A & O X 3, D/C instructions reviewed, and sits independently.         Subjective:    Patient ID: Darrell Baldwin, male    DOB: [birth year], 78 y.o.   MRN: [id number]  HPI    Review of Systems     Objective:   Physical Exam        Assessment & Plan:

## 2024-02-15 NOTE — Progress Notes (Signed)
 Left L4-5, L5-S1 Lumbar transforaminal epidural steroid injection under fluoroscopic guidance with contrast enhancement  Indication: Lumbosacral radiculitis is not relieved by medication management or other conservative care and interfering with self-care and mobility.   Informed consent was obtained after describing risk and benefits of the procedure with the patient, this includes bleeding, bruising, infection, paralysis and medication side effects.  The patient wishes to proceed and has given written consent.  Patient was placed in prone position.  The lumbar area was marked and prepped with Betadine.  It was entered with a 25-gauge 1-1/2 inch needle and one mL of 1% lidocaine  was injected into the skin and subcutaneous tissue.  Then a 22-gauge 5in spinal needle was inserted into the Left L5-S1 intervertebral foramen under AP, lateral, and oblique view.  Once needle tip was within the foramen on lateral views and or exceeding 6 o clock position on the pedicle on AP view, Omnipaque  180 was injected x 2ml Then a solution containing one mL of 10 mg per mL dexamethasone  and 2 mL of 1% lidocaine  was injected. This same procedure was performed at L4-5 on the left side as well .  The patient tolerated procedure well.  Post procedure instructions were given.  Please see post procedure form.

## 2024-02-15 NOTE — Patient Instructions (Signed)

## 2024-03-01 ENCOUNTER — Encounter: Payer: Self-pay | Admitting: Physical Medicine & Rehabilitation

## 2024-03-01 ENCOUNTER — Encounter: Attending: Physical Medicine & Rehabilitation | Admitting: Physical Medicine & Rehabilitation

## 2024-03-01 VITALS — BP 162/93 | HR 73 | Ht 67.0 in | Wt 169.6 lb

## 2024-03-01 DIAGNOSIS — M7918 Myalgia, other site: Secondary | ICD-10-CM | POA: Insufficient documentation

## 2024-03-01 DIAGNOSIS — G8929 Other chronic pain: Secondary | ICD-10-CM | POA: Diagnosis not present

## 2024-03-01 MED ORDER — CYCLOBENZAPRINE HCL 10 MG PO TABS: 10.0000 mg | ORAL_TABLET | Freq: Every day | ORAL | 1 refills | Status: DC

## 2024-03-01 NOTE — Progress Notes (Signed)
 Subjective:    Patient ID: Darrell Baldwin, male    DOB: 1946/06/10, 78 y.o.   MRN: [id number]  HPI  78 year old male with lumbar spondylosis mainly affecting L5-S1 level noted on MRI scan also does have stenosis left L4-5 L5-S1.  He underwent left L4-5 left L5-S1 transforaminal lumbar epidural steroid injection under fluoroscopic guidance on 02/11/2024 Had trochanteric bursa injection performed by orthopedic Dr Jerri without benefit April 2024,I did gluteal trigger point injection without relief 6/3 some partial temporary relief with left ESI L4-5 , L5-S1, ~5d Patient tried physical therapy in May and June 2025 without significant improvements Patient has had no new bowel or bladder dysfunction He has had no recent hospitalizations, no recent weight loss or loss of appetite. Pain Inventory Average Pain 9 Pain Right Now 9 My pain is sharp and aching  In the last 24 hours, has pain interfered with the following? General activity 5 Relation with others 1 Enjoyment of life 2 What TIME of day is your pain at its worst? morning , daytime, evening, and night Sleep (in general) Poor  Pain is worse with: walking, bending, sitting, inactivity, standing, and some activites Pain improves with: heat/ice Relief from Meds: 6  Family History  Problem Relation Age of Onset   Breast cancer Sister    Diabetes Sister    Lung cancer Brother    Liver cancer Sister    Diabetes Sister    Colon cancer Neg Hx    Social History   Socioeconomic History   Marital status: Married    Spouse name: Not on file   Number of children: Not on file   Years of education: Not on file   Highest education level: Not on file  Occupational History   Not on file  Tobacco Use   Smoking status: Never   Smokeless tobacco: Never  Vaping Use   Vaping status: Never Used  Substance and Sexual Activity   Alcohol use: No    Alcohol/week: 0.0 standard drinks of alcohol   Drug use: No   Sexual activity: Not on file   Other Topics Concern   Not on file  Social History Narrative   Not on file   Social Drivers of Health   Financial Resource Strain: Low Risk  (08/15/2022)   Overall Financial Resource Strain (CARDIA)    Difficulty of Paying Living Expenses: Not hard at all  Food Insecurity: No Food Insecurity (08/15/2022)   Hunger Vital Sign    Worried About Running Out of Food in the Last Year: Never true    Ran Out of Food in the Last Year: Never true  Transportation Needs: No Transportation Needs (08/15/2022)   PRAPARE - Administrator, Civil Service (Medical): No    Lack of Transportation (Non-Medical): No  Physical Activity: Sufficiently Active (08/15/2022)   Exercise Vital Sign    Days of Exercise per Week: 7 days    Minutes of Exercise per Session: 30 min  Stress: No Stress Concern Present (08/15/2022)   [employer] of Occupational Health - Occupational Stress Questionnaire    Feeling of Stress : Not at all  Social Connections: Socially Integrated (08/15/2022)   Social Connection and Isolation Panel    Frequency of Communication with Friends and Family: More than three times a week    Frequency of Social Gatherings with Friends and Family: More than three times a week    Attends Religious Services: More than 4 times per year    Active  Member of Clubs or Organizations: Yes    Attends Banker Meetings: 1 to 4 times per year    Marital Status: Married   Past Surgical History:  Procedure Laterality Date   COLONOSCOPY  04/11/2014   per Dr. Obie, diverticulosis and internal hemorrhoids, no polyps, repeat in 10 yrs    Past Surgical History:  Procedure Laterality Date   COLONOSCOPY  04/11/2014   per Dr. Obie, diverticulosis and internal hemorrhoids, no polyps, repeat in 10 yrs    Past Medical History:  Diagnosis Date   Allergic rhinitis    Hypertension    BP (!) 162/93 (BP Location: Left Arm, Patient Position: Sitting, Cuff Size: Normal)   Pulse 73   Ht  5' 7 (1.702 m)   Wt 169 lb 9.6 oz (76.9 kg)   SpO2 97%   BMI 26.56 kg/m   Opioid Risk Score:   Fall Risk Score:  `1  Depression screen [facility] 2/9     02/15/2024   10:06 AM 11/28/2023    2:38 PM 09/12/2023   11:04 AM 04/04/2023    3:45 PM 03/06/2023    3:34 PM 08/15/2022    3:34 PM 08/03/2022   11:13 AM  Depression screen PHQ 2/9  Decreased Interest 0 0 0 0 0 0 0  Down, Depressed, Hopeless 0 0 0 0 0 0 0  PHQ - 2 Score 0 0 0 0 0 0 0  Altered sleeping  0 0 0 0 0 0  Tired, decreased energy  0 0 0 0 0 0  Change in appetite  0 0 0 0 0 0  Feeling bad or failure about yourself   0 0 0 0 0 0  Trouble concentrating  0 0 0 0 0 0  Moving slowly or fidgety/restless  0 0 0 0 0 0  Suicidal thoughts  0 0 0 0 0 0  PHQ-9 Score  0 0 0 0 0 0  Difficult doing work/chores   Not difficult at all Not difficult at all Not difficult at all Not difficult at all Not difficult at all      Review of Systems  Musculoskeletal:  Positive for joint swelling.       Left hip pain  All other systems reviewed and are negative.      Objective:   Physical Exam No acute distress Mood and affect appropriate Extremities without edema Negative straight leg raising bilaterally Motor strength is 5/5 bilateral hip flexor knee extensor ankle dorsiflexion  Sacral thrust (prone) : Negative Lateral compression: Negative FABER's: Positive right Distraction (supine): Negative Thigh thrust test: Negative No tenderness palpation of the greater trochanters  Lumbar spine has some tenderness left L4-L5 paraspinal region The patient also points to an area over the gluteal region but with palpation there is no masses or tenderness elicited. Hip range of motion is normal bilaterally.      Assessment & Plan:   1.  Chronic left gluteal pain question tear at the musculotendinous junction he did experience a pop at onset of this about a year ago.  He does have signs of lumbar stenosis at L4-5 L5-S1 but only had very  temporary relief with epidurals at those levels. He does have significant lumbar facet arthropathy in the lower lumbar area and diagnostic medial branch blocks are indicated to try to identify pain generator. Will also check MRI of the pelvis and hips to look at the gluteal region if there is a deep soft tissue mass or  tendinous tears that could be causing his symptomatology on the left side Discussed with patient and his wife Return to clinic 1 month

## 2024-03-19 ENCOUNTER — Ambulatory Visit
Admission: RE | Admit: 2024-03-19 | Discharge: 2024-03-19 | Disposition: A | Discharge status: Home / Self Care | Source: Ambulatory Visit | Attending: Physical Medicine & Rehabilitation | Admitting: Physical Medicine & Rehabilitation

## 2024-03-19 DIAGNOSIS — G8929 Other chronic pain: Secondary | ICD-10-CM

## 2024-03-19 DIAGNOSIS — M1612 Unilateral primary osteoarthritis, left hip: Secondary | ICD-10-CM | POA: Diagnosis not present

## 2024-03-28 ENCOUNTER — Encounter: Admitting: Physical Medicine & Rehabilitation

## 2024-03-28 ENCOUNTER — Encounter: Attending: Physical Medicine & Rehabilitation | Admitting: Physical Medicine & Rehabilitation

## 2024-03-28 ENCOUNTER — Encounter: Payer: Self-pay | Admitting: Physical Medicine & Rehabilitation

## 2024-03-28 VITALS — BP 166/89 | HR 74 | Ht 67.0 in | Wt 164.0 lb

## 2024-03-28 DIAGNOSIS — M47816 Spondylosis without myelopathy or radiculopathy, lumbar region: Secondary | ICD-10-CM | POA: Diagnosis not present

## 2024-03-28 DIAGNOSIS — M7918 Myalgia, other site: Secondary | ICD-10-CM | POA: Diagnosis not present

## 2024-03-28 DIAGNOSIS — G8929 Other chronic pain: Secondary | ICD-10-CM | POA: Diagnosis not present

## 2024-03-28 DIAGNOSIS — M48061 Spinal stenosis, lumbar region without neurogenic claudication: Secondary | ICD-10-CM | POA: Diagnosis not present

## 2024-03-28 DIAGNOSIS — M5416 Radiculopathy, lumbar region: Secondary | ICD-10-CM | POA: Diagnosis not present

## 2024-03-28 NOTE — Patient Instructions (Signed)
 Acupuncture Acupuncture is a type of treatment that involves stimulating specific points on your body by inserting thin needles through your skin. Acupuncture is often used to treat pain, but it may also be used to help relieve other types of symptoms. Your health care provider may recommend acupuncture to help treat various conditions, such as: Migraine and tension headaches. Nausea and vomiting after a surgery or cancer treatment. Sudden or severe (acute) pain, or long-term (chronic) pain. Addiction. Acupuncture is based on traditional [place] medicine, which recognizes more than 2,000 points on the body that connect energy pathways (meridians) through the body. The goal in stimulating these points is to balance the physical, emotional, and mental energy in your body. Acupuncture is done by a health care provider who has specialized training (licensed acupuncture practitioner). Treatment often requires several acupuncture sessions. You may have acupuncture along with other medical treatments. Tell a health care provider about: Any allergies you have. All medicines you are taking, including vitamins, herbs, eye drops, creams, and over-the-counter medicines. Any blood disorders you have. Any surgeries you have had. Any medical conditions you have. Whether you are pregnant or may be pregnant. What are the risks? Generally, this is a safe treatment. However, problems may occur, including: Skin infection. Damage to organs or structures that are under the skin if a needle is placed too deeply. This is rare. What happens before the treatment? Your acupuncture practitioner will ask about your medical history and your symptoms. You may have a physical exam. What happens during the treatment? The exact procedure will depend on your condition and how your acupuncture provider treats it. In general: Your skin will be cleaned with a germ-killing (antiseptic) solution. Your acupuncture practitioner will  open a new set of germ-free (sterile) needles. The needles will be gently inserted into your skin. They will be left in place for a certain amount of time. You may feel a tingling or burning sensation for a very short period of time. Your acupuncture practitioner may: Apply electrical energy to the needles. Adjust the needles in certain ways. After your procedure, the acupuncture practitioner will remove the needles, throw them away, and clean your skin. The procedure may vary among health care providers. What can I expect after the treatment? People react differently to acupuncture. Make sure you ask your acupuncture provider what to expect after your treatment. It is common to have: Minor bruising. Mild pain. A small amount of bleeding. Follow these instructions at home: Follow any instructions given by your provider after the treatment. Keep all follow-up visits. This is important. Where to find more information [facility]: [email or web address] Contact a health care provider if: You have questions about your reaction to the treatment. You have soreness. You have skin irritation or redness. You have a fever. Summary Acupuncture is a type of treatment that involves stimulating specific points on your body by inserting thin needles through your skin. This treatment is often used to treat pain, but it may also be used to help relieve other types of symptoms. The exact procedure will depend on your condition and how your acupuncture provider treats it. This information is not intended to replace advice given to you by your health care provider. Make sure you discuss any questions you have with your health care provider. Document Revised: 03/17/2021 Document Reviewed: 03/17/2021 Elsevier Patient Education  2024 [employer].

## 2024-03-28 NOTE — Progress Notes (Signed)
 Subjective:    Patient ID: Darrell Baldwin, male    DOB: 03-Jun-1946, 78 y.o.   MRN: [id number]  HPI 78 year old male with posterior lateral hip pain onset in November 2024.  He was walking up an incline and felt a pop and he went down immediately.  He did not have any pain going down his leg no numbness tingling no progressive weakness no bowel or bladder dysfunction. He saw orthopedic surgery as well as his primary care physician.  He was referred to this office after failure of physical therapy as well as trochanteric bursa injections. Transforaminal epidural steroid injection was performed left L4-5 and left L5-S1 without improvements.  MRI of the lumbar spine was reviewed, facet arthropathy was noted.  His exam did not show any facet joint mediated pain no pain with extension.  No tenderness along the lumbar paraspinals. Because of continued pain and an area over his gluteus medius on the left side MRI of left hip was performed which did not show any signs of soft tissue mass.  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  He does not have any groin pain.  MR HIP WITHOUT IV CONTRAST LEFT   COMPARISON: X-ray 11/16/2023   CLINICAL HISTORY: Left hip pain.   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 There is a small subchondral cysts in the central femoral head. There is no subchondral reactive edema or cystic change otherwise identified. No significant joint effusion is present. There is no fracture or contusion. The pelvis, sacrum and SI joints are normal so far as visualized.   Musculotendinous structures: There is mild insertional tendinosis of the gluteus medius and minimus tendons. There is no accelerated tendinosis  or myositis. No greater trochanteric bursal collection is present.   Limited evaluation of the intrapelvic structures demonstrates a retracted urinary bladder limiting evaluation. Otherwise the intrapelvic structures are unremarkable.   IMPRESSION: Moderate degenerative arthrosis with osteophyte formation and joint space loss. There is no significant subchondral reactive edema. Trace joint effusion. No acute abnormality.   No discrete labral tear is identified on this limited examination.   No acute abnormality.   Electronically signed by: Norleen Satchel MD 03/21/2024 02:47 PM EDT RP Workstation: [id number]  DG HIP (WITH OR WITHOUT PELVIS) 2V LEFT   COMPARISON:  None Available.   FINDINGS: Pelvic ring is intact. No acute fracture or dislocation is noted. Mild degenerative changes of the hip joints are noted bilaterally. No soft tissue changes are seen.   IMPRESSION: Degenerative change without acute abnormality.     Electronically Signed   By: Oneil Devonshire M.D.   On: 11/25/2023 21:18     Pain Inventory Average Pain 7 Pain Right Now 7 My pain is sharp  In the last 24 hours, has pain interfered with the following? General activity 0 Relation with others 0 Enjoyment of life 0 What TIME of day is your pain at its worst? varies Sleep (in general) Poor  Pain is worse with: walking, sitting, inactivity, and standing Pain improves with: rest Relief from Meds: .  Family History  Problem Relation Age of Onset   Breast cancer Sister    Diabetes Sister    Lung cancer Brother  Liver cancer Sister    Diabetes Sister    Colon cancer Neg Hx    Social History   Socioeconomic History   Marital status: Married    Spouse name: Not on file   Number of children: Not on file   Years of education: Not on file   Highest education level: Not on file  Occupational History   Not on file  Tobacco Use   Smoking status: Never   Smokeless tobacco: Never  Vaping Use    Vaping status: Never Used  Substance and Sexual Activity   Alcohol use: No    Alcohol/week: 0.0 standard drinks of alcohol   Drug use: No   Sexual activity: Not on file  Other Topics Concern   Not on file  Social History Narrative   Not on file   Social Drivers of Health   Financial Resource Strain: Low Risk  (08/15/2022)   Overall Financial Resource Strain (CARDIA)    Difficulty of Paying Living Expenses: Not hard at all  Food Insecurity: No Food Insecurity (08/15/2022)   Hunger Vital Sign    Worried About Running Out of Food in the Last Year: Never true    Ran Out of Food in the Last Year: Never true  Transportation Needs: No Transportation Needs (08/15/2022)   PRAPARE - Administrator, Civil Service (Medical): No    Lack of Transportation (Non-Medical): No  Physical Activity: Sufficiently Active (08/15/2022)   Exercise Vital Sign    Days of Exercise per Week: 7 days    Minutes of Exercise per Session: 30 min  Stress: No Stress Concern Present (08/15/2022)   [employer] of Occupational Health - Occupational Stress Questionnaire    Feeling of Stress : Not at all  Social Connections: Socially Integrated (08/15/2022)   Social Connection and Isolation Panel    Frequency of Communication with Friends and Family: More than three times a week    Frequency of Social Gatherings with Friends and Family: More than three times a week    Attends Religious Services: More than 4 times per year    Active Member of Clubs or Organizations: Yes    Attends Banker Meetings: 1 to 4 times per year    Marital Status: Married   Past Surgical History:  Procedure Laterality Date   COLONOSCOPY  04/11/2014   per Dr. Obie, diverticulosis and internal hemorrhoids, no polyps, repeat in 10 yrs    Past Surgical History:  Procedure Laterality Date   COLONOSCOPY  04/11/2014   per Dr. Obie, diverticulosis and internal hemorrhoids, no polyps, repeat in 10 yrs    Past  Medical History:  Diagnosis Date   Allergic rhinitis    Hypertension    BP (!) 166/89   Pulse 74   Ht 5' 7 (1.702 m)   Wt 164 lb (74.4 kg)   SpO2 93%   BMI 25.69 kg/m   Opioid Risk Score:   Fall Risk Score:  `1  Depression screen [facility] 2/9     02/15/2024   10:06 AM 11/28/2023    2:38 PM 09/12/2023   11:04 AM 04/04/2023    3:45 PM 03/06/2023    3:34 PM 08/15/2022    3:34 PM 08/03/2022   11:13 AM  Depression screen PHQ 2/9  Decreased Interest 0 0 0 0 0 0 0  Down, Depressed, Hopeless 0 0 0 0 0 0 0  PHQ - 2 Score 0 0 0 0 0 0 0  Altered  sleeping  0 0 0 0 0 0  Tired, decreased energy  0 0 0 0 0 0  Change in appetite  0 0 0 0 0 0  Feeling bad or failure about yourself   0 0 0 0 0 0  Trouble concentrating  0 0 0 0 0 0  Moving slowly or fidgety/restless  0 0 0 0 0 0  Suicidal thoughts  0 0 0 0 0 0  PHQ-9 Score  0 0 0 0 0 0  Difficult doing work/chores   Not difficult at all Not difficult at all Not difficult at all Not difficult at all Not difficult at all     Review of Systems  Musculoskeletal:        Left hip pain  All other systems reviewed and are negative.      Objective:   Physical Exam  Negative straight leg raise Motor strength is 5/5 bilateral lower extremities Lumbar range of motion is normal from [place]: Negative Sacral thrust (prone) : Negative Lateral compression: Negative FABER's: Negative Distraction (supine): Negative Thigh thrust test: Negative     Assessment & Plan:  1.  Chronic left posterior lateral hip pain his painful area is in a soft tissue area corresponding to the gluteal medius on the left side.  While he has some mild tendinosis of that area no clear-cut signs of tendon tear. His SI provocative tests have all been negative with serial examinations. At this point I think a trial of acupuncture would be helpful.  Walking tolerance has been improving up to around 150 feet before pain sets in. If this is not helpful consider lumbar medial  branch blocks given arthritic changes at L4-5 L5-S1.

## 2024-04-16 ENCOUNTER — Encounter: Payer: Self-pay | Admitting: Family Medicine

## 2024-04-16 ENCOUNTER — Ambulatory Visit (INDEPENDENT_AMBULATORY_CARE_PROVIDER_SITE_OTHER): Admitting: Family Medicine

## 2024-04-16 VITALS — BP 146/74 | HR 71 | Temp 98.2°F | Wt 171.0 lb

## 2024-04-16 DIAGNOSIS — G8929 Other chronic pain: Secondary | ICD-10-CM

## 2024-04-16 DIAGNOSIS — M25552 Pain in left hip: Secondary | ICD-10-CM

## 2024-04-16 NOTE — Progress Notes (Signed)
   Subjective:    Patient ID: Darrell Baldwin, male    DOB: 1945/08/15, 78 y.o.   MRN: [id number]  HPI Here for chronic left hip pain. He has seen Orthopedics a number of times but they have not been able to help this. He asks to see Dr. Arthea Sharps for another opinion.    Review of Systems  Constitutional: Negative.   Respiratory: Negative.    Cardiovascular: Negative.   Musculoskeletal:  Positive for arthralgias.       Objective:   Physical Exam Constitutional:      Appearance: Normal appearance.     Comments: He walks normally   Cardiovascular:     Rate and Rhythm: Normal rate and regular rhythm.     Pulses: Normal pulses.     Heart sounds: Normal heart sounds.  Pulmonary:     Effort: Pulmonary effort is normal.     Breath sounds: Normal breath sounds.  Neurological:     Mental Status: He is alert.           Assessment & Plan:  Chronic left hip pain. We will refer him to Dr. Sharps as above.  Garnette Olmsted, MD

## 2024-04-23 ENCOUNTER — Ambulatory Visit (INDEPENDENT_AMBULATORY_CARE_PROVIDER_SITE_OTHER)

## 2024-04-23 DIAGNOSIS — Z23 Encounter for immunization: Secondary | ICD-10-CM | POA: Diagnosis not present

## 2024-04-25 NOTE — Progress Notes (Unsigned)
 Darrell Baldwin JENI Darrell Baldwin [address] [place] [address] Phone: [phone] Subjective:   LILLETTE Darrell Baldwin, am serving as a scribe for Dr. Arthea Baldwin.  I'm seeing this patient by the request  of:  Johnny Garnette LABOR, MD  CC: Left hip pain  YEP:Darrell Baldwin  Darrell Baldwin is a 78 y.o. male coming in with complaint of L hip pain for past year.  Past medical history is significant for spinal stenosis of the lumbar region and has seen physical Baldwin and rehabilitation. Pain is constant and worse when walking and lying down. Has tried PT and multiple injections.      MRI of the left hip done in August of this year showed the patient did have mild interstitial gluteal medius and minimus tendon tearing when I do independently visualized.  Unfortunately also has moderate arthritic changes of the hip itself.  Past Medical History:  Diagnosis Date   Allergic rhinitis    Hypertension    Past Surgical History:  Procedure Laterality Date   COLONOSCOPY  04/11/2014   per Dr. Obie, diverticulosis and internal hemorrhoids, no polyps, repeat in 10 yrs    Social History   Socioeconomic History   Marital status: Married    Spouse name: Not on file   Number of children: Not on file   Years of education: Not on file   Highest education level: Not on file  Occupational History   Not on file  Tobacco Use   Smoking status: Never   Smokeless tobacco: Never  Vaping Use   Vaping status: Never Used  Substance and Sexual Activity   Alcohol use: No    Alcohol/week: 0.0 standard drinks of alcohol   Drug use: No   Sexual activity: Not on file  Other Topics Concern   Not on file  Social History Narrative   Not on file   Social Drivers of Health   Financial Resource Strain: Low Risk  (08/15/2022)   Overall Financial Resource Strain (CARDIA)    Difficulty of Paying Living Expenses: Not hard at all  Food Insecurity: No Food Insecurity (08/15/2022)   Hunger Vital Sign     Worried About Running Out of Food in the Last Year: Never true    Ran Out of Food in the Last Year: Never true  Transportation Needs: No Transportation Needs (08/15/2022)   PRAPARE - Administrator, Civil Service (Medical): No    Lack of Transportation (Non-Medical): No  Physical Activity: Sufficiently Active (08/15/2022)   Exercise Vital Sign    Days of Exercise per Week: 7 days    Minutes of Exercise per Session: 30 min  Stress: No Stress Concern Present (08/15/2022)   Darrell Baldwin of Occupational Health - Occupational Stress Questionnaire    Feeling of Stress : Not at all  Social Connections: Socially Integrated (08/15/2022)   Social Connection and Isolation Panel    Frequency of Communication with Friends and Family: More than three times a week    Frequency of Social Gatherings with Friends and Family: More than three times a week    Attends Religious Services: More than 4 times per year    Active Member of [employer] or Organizations: Yes    Attends Banker Meetings: 1 to 4 times per year    Marital Status: Married   Allergies  Allergen Reactions   Statins     Muscle cramps   Family History  Problem Relation Age of Onset  Breast cancer Sister    Diabetes Sister    Lung cancer Brother    Liver cancer Sister    Diabetes Sister    Colon cancer Neg Hx      Current Outpatient Medications (Cardiovascular):    amLODipine  (NORVASC ) 10 MG tablet, Take 1 tablet (10 mg total) by mouth daily.   EPINEPHrine 0.3 mg/0.3 mL IJ SOAJ injection, as directed Injection prn for 30 days   ezetimibe  (ZETIA ) 10 MG tablet, Take 1 tablet (10 mg total) by mouth daily.   sildenafil  (VIAGRA ) 100 MG tablet, Take 1 tablet (100 mg total) by mouth as needed for erectile dysfunction.  Current Outpatient Medications (Respiratory):    albuterol  (VENTOLIN  HFA) 108 (90 Base) MCG/ACT inhaler, Inhale 2 puffs into the lungs every 4 (four) hours as needed for wheezing or shortness of  breath.   chlorpheniramine-HYDROcodone  (TUSSIONEX PENNKINETIC ER) 10-8 MG/5ML, Take 5 mLs by mouth every 12 (twelve) hours as needed for cough.   dextromethorphan (DELSYM) 30 MG/5ML liquid, Take by mouth as needed for cough.   fluticasone  (FLONASE ) 50 MCG/ACT nasal spray, Place 2 sprays into both nostrils daily.   levocetirizine (XYZAL ) 5 MG tablet, Take 1 tablet (5 mg total) by mouth every evening.   montelukast  (SINGULAIR ) 10 MG tablet, Take 1 tablet (10 mg total) by mouth at bedtime.  Current Outpatient Medications (Analgesics):    allopurinol  (ZYLOPRIM ) 300 MG tablet, Take 1 tablet (300 mg total) by mouth daily.   ibuprofen  (ADVIL ) 800 MG tablet, Take 1 tablet (800 mg total) by mouth every 6 (six) hours as needed for mild pain (pain score 1-3).   meloxicam  (MOBIC ) 15 MG tablet, Take 1 tablet (15 mg total) by mouth daily.   Current Outpatient Medications (Other):    COMIRNATY syringe,    cyclobenzaprine  (FLEXERIL ) 10 MG tablet, TAKE 1 TABLET BY MOUTH EVERYDAY AT BEDTIME   terbinafine  (LAMISIL ) 250 MG tablet, Take 1 tablet (250 mg total) by mouth daily.   Reviewed prior external information including notes and imaging from  primary care provider As well as notes that were available from care everywhere and other healthcare systems.  Past medical history, social, surgical and family history all reviewed in electronic medical record.  No pertanent information unless stated regarding to the chief complaint.   Review of Systems:  No headache, visual changes, nausea, vomiting, diarrhea, constipation, dizziness, abdominal pain, skin rash, fevers, chills, night sweats, weight loss, swollen lymph nodes, body aches, joint swelling, chest pain, shortness of breath, mood changes. POSITIVE muscle aches  Objective  Blood pressure 116/82, pulse (!) 52, height 5' 7 (1.702 m), weight 167 lb (75.8 kg), SpO2 96%.   General: No apparent distress alert and oriented x3 mood and affect normal, dressed  appropriately.  HEENT: Pupils equal, extraocular movements intact  Respiratory: Patient's speak in full sentences and does not appear short of breath  Cardiovascular: No lower extremity edema, non tender, no erythema  Low back exam shows some tightness noted in the paraspinal musculature.  Some limited range of motion noted.  Left hip exam shows tender to palpation over the gluteal area on the left side.  Some weakness with hip abductor, negative though FABER with no pain over the sacroiliac joint.  Procedure: Real-time Ultrasound Guided Injection of left gluteal tendon sheath Device: GE Logiq Q7 Ultrasound guided injection is preferred based studies that show increased duration, increased effect, greater accuracy, decreased procedural pain, increased response rate, and decreased cost with ultrasound guided versus blind injection.  Verbal informed consent obtained.  Time-out conducted.  Noted no overlying erythema, induration, or other signs of local infection.  Skin prepped in a sterile fashion.  Local anesthesia: Topical Ethyl chloride.  With sterile technique and under real time ultrasound guidance: With a 21-gauge 2 inch needle injected with 0.5 cc of 0.5% Marcaine  and 1 cc of Kenalog  40 mg/mL into the gluteal medius tendon sheath at the gluteal medius muscle juncture. Completed without difficulty  Pain immediately resolved suggesting accurate placement of the medication.  Advised to call if fevers/chills, erythema, induration, drainage, or persistent bleeding.  Impression: Technically successful ultrasound guided injection.    Impression and Recommendations:    The above documentation has been reviewed and is accurate and complete Erminie Foulks M Jeriah Skufca, DO

## 2024-04-28 ENCOUNTER — Other Ambulatory Visit: Payer: Self-pay | Admitting: Physical Medicine & Rehabilitation

## 2024-04-29 ENCOUNTER — Ambulatory Visit (INDEPENDENT_AMBULATORY_CARE_PROVIDER_SITE_OTHER): Admitting: Family Medicine

## 2024-04-29 ENCOUNTER — Other Ambulatory Visit: Payer: Self-pay

## 2024-04-29 VITALS — BP 116/82 | HR 52 | Ht 67.0 in | Wt 167.0 lb

## 2024-04-29 DIAGNOSIS — S76012A Strain of muscle, fascia and tendon of left hip, initial encounter: Secondary | ICD-10-CM | POA: Diagnosis not present

## 2024-04-29 DIAGNOSIS — M25552 Pain in left hip: Secondary | ICD-10-CM | POA: Diagnosis not present

## 2024-04-29 NOTE — Patient Instructions (Signed)
 Injection in backside Glute exercises See me again in 8-10 weeks

## 2024-04-29 NOTE — Assessment & Plan Note (Signed)
 No retraction noted, given injection, discussed the possibility of PRP, discussed icing regimen and home exercises, discussed which activities to do and which ones to avoid.  Increase activity slowly.  I do believe that some of the weakness that contributed to this could be from his back.  Does have underlying arthritic changes noted as well I will need to continue to monitor.  Follow-up again in 6 to 8 weeks otherwise.

## 2024-04-30 ENCOUNTER — Encounter: Payer: Self-pay | Admitting: Family Medicine

## 2024-05-02 ENCOUNTER — Encounter: Attending: Physical Medicine & Rehabilitation | Admitting: Physical Medicine & Rehabilitation

## 2024-05-02 DIAGNOSIS — M5416 Radiculopathy, lumbar region: Secondary | ICD-10-CM | POA: Insufficient documentation

## 2024-05-02 DIAGNOSIS — M7918 Myalgia, other site: Secondary | ICD-10-CM | POA: Insufficient documentation

## 2024-05-02 DIAGNOSIS — M47816 Spondylosis without myelopathy or radiculopathy, lumbar region: Secondary | ICD-10-CM | POA: Insufficient documentation

## 2024-05-02 DIAGNOSIS — G8929 Other chronic pain: Secondary | ICD-10-CM | POA: Insufficient documentation

## 2024-05-02 DIAGNOSIS — M48061 Spinal stenosis, lumbar region without neurogenic claudication: Secondary | ICD-10-CM | POA: Insufficient documentation

## 2024-05-09 ENCOUNTER — Encounter: Admitting: Physical Medicine & Rehabilitation

## 2024-05-09 ENCOUNTER — Ambulatory Visit (INDEPENDENT_AMBULATORY_CARE_PROVIDER_SITE_OTHER)

## 2024-05-09 VITALS — BP 116/82 | Ht 67.0 in | Wt 163.4 lb

## 2024-05-09 DIAGNOSIS — Z0000 Encounter for general adult medical examination without abnormal findings: Secondary | ICD-10-CM

## 2024-05-09 NOTE — Progress Notes (Signed)
 Because this visit was a virtual/telehealth visit,  certain criteria was not obtained, such a blood pressure, CBG if applicable, and timed get up and go. Any medications not marked as taking were not mentioned during the medication reconciliation part of the visit. Any vitals not documented were not able to be obtained due to this being a telehealth visit or patient was unable to self-report a recent blood pressure reading due to a lack of equipment at home via telehealth. Vitals that have been documented are verbally provided by the patient.   This visit was performed by a medical professional under my direct supervision. I was immediately available for consultation/collaboration. I have reviewed and agree with the Annual Wellness Visit documentation.  Subjective:   Darrell Baldwin is a 78 y.o. who presents for a Medicare Wellness preventive visit.  As a reminder, Annual Wellness Visits don't include a physical exam, and some assessments may be limited, especially if this visit is performed virtually. We may recommend an in-person follow-up visit with your provider if needed.  Visit Complete: Virtual I connected with  Darrell Baldwin on 05/09/24 by a audio enabled telemedicine application and verified that I am speaking with the correct person using two identifiers.  Patient Location: Home  Provider Location: Home Office  I discussed the limitations of evaluation and management by telemedicine. The patient expressed understanding and agreed to proceed.  Vital Signs: Because this visit was a virtual/telehealth visit, some criteria may be missing or patient reported. Any vitals not documented were not able to be obtained and vitals that have been documented are patient reported.  VideoDeclined- This patient declined Librarian, academic. Therefore the visit was completed with audio only.  Persons Participating in Visit: Patient.  AWV Questionnaire: No: Patient  Medicare AWV questionnaire was not completed prior to this visit.  Cardiac Risk Factors include: advanced age (>15men, >56 women);male gender;dyslipidemia;hypertension     Objective:    Today's Vitals   05/09/24 1011  BP: 116/82  Weight: 163 lb 6.4 oz (74.1 kg)  Height: 5' 7 (1.702 m)   Body mass index is 25.59 kg/m.     05/09/2024   10:17 AM 12/13/2023    4:20 PM 08/15/2022    3:35 PM 08/11/2021    3:33 PM 08/07/2020    3:41 PM 03/26/2014   12:57 PM  Advanced Directives  Does Patient Have a Medical Advance Directive? No No Yes Yes No No   Type of Estate agent of [place];Living will  Healthcare Power of [place];Living will Healthcare Power of [place];Living will    Does patient want to make changes to medical advance directive? No - Patient declined   No - Patient declined    Copy of Healthcare Power of Attorney in Chart? Yes - validated most recent copy scanned in chart (See row information)  No - copy requested No - copy requested    Would patient like information on creating a medical advance directive?  No - Patient declined   No - Patient declined      Data saved with a previous flowsheet row definition    Current Medications (verified) Outpatient Encounter Medications as of 05/09/2024  Medication Sig   albuterol  (VENTOLIN  HFA) 108 (90 Base) MCG/ACT inhaler Inhale 2 puffs into the lungs every 4 (four) hours as needed for wheezing or shortness of breath.   allopurinol  (ZYLOPRIM ) 300 MG tablet Take 1 tablet (300 mg total) by mouth daily.   amLODipine  (NORVASC ) 10 MG  tablet Take 1 tablet (10 mg total) by mouth daily.   chlorpheniramine-HYDROcodone  (TUSSIONEX PENNKINETIC ER) 10-8 MG/5ML Take 5 mLs by mouth every 12 (twelve) hours as needed for cough.   COMIRNATY syringe    cyclobenzaprine  (FLEXERIL ) 10 MG tablet TAKE 1 TABLET BY MOUTH EVERYDAY AT BEDTIME   dextromethorphan (DELSYM) 30 MG/5ML liquid Take by mouth as needed for cough.   EPINEPHrine 0.3  mg/0.3 mL IJ SOAJ injection as directed Injection prn for 30 days   ezetimibe  (ZETIA ) 10 MG tablet Take 1 tablet (10 mg total) by mouth daily.   fluticasone  (FLONASE ) 50 MCG/ACT nasal spray Place 2 sprays into both nostrils daily.   ibuprofen  (ADVIL ) 800 MG tablet Take 1 tablet (800 mg total) by mouth every 6 (six) hours as needed for mild pain (pain score 1-3).   levocetirizine (XYZAL ) 5 MG tablet Take 1 tablet (5 mg total) by mouth every evening.   meloxicam  (MOBIC ) 15 MG tablet Take 1 tablet (15 mg total) by mouth daily.   montelukast  (SINGULAIR ) 10 MG tablet Take 1 tablet (10 mg total) by mouth at bedtime.   sildenafil  (VIAGRA ) 100 MG tablet Take 1 tablet (100 mg total) by mouth as needed for erectile dysfunction.   terbinafine  (LAMISIL ) 250 MG tablet Take 1 tablet (250 mg total) by mouth daily.   No facility-administered encounter medications on file as of 05/09/2024.    Allergies (verified) Statins   History: Past Medical History:  Diagnosis Date   Allergic rhinitis    Hypertension    Past Surgical History:  Procedure Laterality Date   COLONOSCOPY  04/11/2014   per Dr. Obie, diverticulosis and internal hemorrhoids, no polyps, repeat in 10 yrs    Family History  Problem Relation Age of Onset   Breast cancer Sister    Diabetes Sister    Lung cancer Brother    Liver cancer Sister    Diabetes Sister    Colon cancer Neg Hx    Social History   Socioeconomic History   Marital status: Married    Spouse name: Not on file   Number of children: Not on file   Years of education: Not on file   Highest education level: Not on file  Occupational History   Not on file  Tobacco Use   Smoking status: Never   Smokeless tobacco: Never  Vaping Use   Vaping status: Never Used  Substance and Sexual Activity   Alcohol use: No    Alcohol/week: 0.0 standard drinks of alcohol   Drug use: No   Sexual activity: Not on file  Other Topics Concern   Not on file  Social History  Narrative   Not on file   Social Drivers of Health   Financial Resource Strain: Low Risk  (05/09/2024)   Overall Financial Resource Strain (CARDIA)    Difficulty of Paying Living Expenses: Not hard at all  Food Insecurity: No Food Insecurity (08/15/2022)   Hunger Vital Sign    Worried About Running Out of Food in the Last Year: Never true    Ran Out of Food in the Last Year: Never true  Transportation Needs: No Transportation Needs (05/09/2024)   PRAPARE - Administrator, Civil Service (Medical): No    Lack of Transportation (Non-Medical): No  Physical Activity: Sufficiently Active (05/09/2024)   Exercise Vital Sign    Days of Exercise per Week: 7 days    Minutes of Exercise per Session: 30 min  Stress: No Stress Concern Present (  05/09/2024)   [employer] of Occupational Health - Occupational Stress Questionnaire    Feeling of Stress: Not at all  Social Connections: Socially Integrated (05/09/2024)   Social Connection and Isolation Panel    Frequency of Communication with Friends and Family: More than three times a week    Frequency of Social Gatherings with Friends and Family: More than three times a week    Attends Religious Services: More than 4 times per year    Active Member of [employer] or Organizations: Yes    Attends Banker Meetings: 1 to 4 times per year    Marital Status: Married    Tobacco Counseling Counseling given: Not Answered    Clinical Intake:  Pre-visit preparation completed: Yes  Pain : No/denies pain     BMI - recorded: 25.59 Nutritional Status: BMI 25 -29 Overweight Nutritional Risks: None Diabetes: No  Lab Results  Component Value Date   HGBA1C 6.3 04/04/2022     How often do you need to have someone help you when you read instructions, pamphlets, or other written materials from your doctor or pharmacy?: 1 - Never  Interpreter Needed?: No  Information entered by :: Avrey Flanagin,CMA   Activities of  Daily Living     05/09/2024   10:15 AM  In your present state of health, do you have any difficulty performing the following activities:  Hearing? 0  Vision? 0  Difficulty concentrating or making decisions? 1  Walking or climbing stairs? 0  Dressing or bathing? 0  Doing errands, shopping? 0  Preparing Food and eating ? Y  Using the Toilet? N  In the past six months, have you accidently leaked urine? N  Do you have problems with loss of bowel control? N  Managing your Medications? N  Managing your Finances? N  Housekeeping or managing your Housekeeping? N    Patient Care Team: Johnny Garnette LABOR, MD as PCP - General (Family Medicine)  I have updated your Care Teams any recent Medical Services you may have received from other providers in the past year.     Assessment:   This is a routine wellness examination for Darrell Baldwin.  Hearing/Vision screen Hearing Screening - Comments:: No difficulties  Vision Screening - Comments:: Patient wears OTC readers    Goals Addressed             This Visit's Progress    Patient Stated   On track    Live long and healthy        Depression Screen     05/09/2024   10:19 AM 02/15/2024   10:06 AM 11/28/2023    2:38 PM 09/12/2023   11:04 AM 04/04/2023    3:45 PM 03/06/2023    3:34 PM 08/15/2022    3:34 PM  PHQ 2/9 Scores  PHQ - 2 Score 0 0 0 0 0 0 0  PHQ- 9 Score 0  0 0 0 0 0    Fall Risk     05/09/2024   10:17 AM 02/15/2024   10:06 AM 01/23/2024    2:01 PM 11/28/2023    2:40 PM 09/12/2023   11:04 AM  Fall Risk   Falls in the past year? 0 0 0 0 0  Number falls in past yr: 0 0   0  Injury with Fall? 0 0   0  Risk for fall due to : No Fall Risks    No Fall Risks  Follow up Falls evaluation completed  Falls evaluation completed    MEDICARE RISK AT HOME:  Medicare Risk at Home Any stairs in or around the home?: Yes If so, are there any without handrails?: No Home free of loose throw rugs in walkways, pet beds, electrical cords,  etc?: Yes Adequate lighting in your home to reduce risk of falls?: Yes Life alert?: No Use of a cane, walker or w/c?: No Grab bars in the bathroom?: Yes Shower chair or bench in shower?: Yes Elevated toilet seat or a handicapped toilet?: Yes  TIMED UP AND GO:  Was the test performed?  No  Cognitive Function: 6CIT completed        05/09/2024   10:13 AM 08/15/2022    3:37 PM 08/11/2021    3:30 PM  6CIT Screen  What Year? 0 points 0 points 0 points  What month? 0 points 0 points 0 points  What time? 0 points 0 points 0 points  Count back from 20 0 points 0 points 0 points  Months in reverse 0 points 0 points 0 points  Repeat phrase 0 points 0 points 0 points  Total Score 0 points 0 points 0 points    Immunizations Immunization History  Administered Date(s) Administered   Fluad Quad(high Dose 65+) 04/30/2019, 04/30/2020, 05/04/2021, 04/04/2022   Fluad Trivalent(High Dose 65+) 07/07/2023   INFLUENZA, HIGH DOSE SEASONAL PF 04/26/2016, 05/23/2017, 05/08/2018, 09/25/2023, 04/23/2024   Influenza,inj,Quad PF,6+ Mos 04/02/2013, 05/11/2022   Moderna Sars-Covid-2 Vaccination 08/23/2019, 09/20/2019, 05/30/2020   PFIZER(Purple Top)SARS-COV-2 Vaccination 05/24/2021, 05/11/2022   Pfizer(Comirnaty)Fall Seasonal Vaccine 12 years and older 04/16/2024   Pneumococcal Conjugate-13 05/08/2018   Pneumococcal Polysaccharide-23 04/30/2019, 05/11/2022   Tdap 04/30/2020   Zoster Recombinant(Shingrix) 08/08/2021    Screening Tests Health Maintenance  Topic Date Due   Zoster Vaccines- Shingrix (2 of 2) 10/03/2021   COVID-19 Vaccine (7 - 2025-26 season) 10/14/2024   Medicare Annual Wellness (AWV)  05/09/2025   DTaP/Tdap/Td (2 - Td or Tdap) 04/30/2030   Pneumococcal Vaccine: 50+ Years  Completed   Influenza Vaccine  Completed   Hepatitis C Screening  Completed   Meningococcal B Vaccine  Aged Out   Colonoscopy  Discontinued    Health Maintenance Items Addressed:patient declined   Additional  Screening:  Vision Screening: Recommended annual ophthalmology exams for early detection of glaucoma and other disorders of the eye. Is the patient up to date with their annual eye exam?  Yes Dental Screening: Recommended annual dental exams for proper oral hygiene  Community Resource Referral / Chronic Care Management: CRR required this visit?  No   CCM required this visit?  No   Plan:    I have personally reviewed and noted the following in the patient's chart:   Medical and social history Use of alcohol, tobacco or illicit drugs  Current medications and supplements including opioid prescriptions. Patient is not currently taking opioid prescriptions. Functional ability and status Nutritional status Physical activity Advanced directives List of other physicians Hospitalizations, surgeries, and ER visits in previous 12 months Vitals Screenings to include cognitive, depression, and falls Referrals and appointments  In addition, I have reviewed and discussed with patient certain preventive protocols, quality metrics, and best practice recommendations. A written personalized care plan for preventive services as well as general preventive health recommendations were provided to patient.   Darrell Baldwin, [place]   05/09/2024   After Visit Summary: (MyChart) Due to this being a telephonic visit, the after visit summary with patients personalized plan was offered to patient via MyChart  Notes: Nothing significant to report at this time.

## 2024-05-09 NOTE — Patient Instructions (Signed)
 Darrell Baldwin,  Thank you for taking the time for your Medicare Wellness Visit. I appreciate your continued commitment to your health goals. Please review the care plan we discussed, and feel free to reach out if I can assist you further.  Medicare recommends these wellness visits once per year to help you and your care team stay ahead of potential health issues. These visits are designed to focus on prevention, allowing your provider to concentrate on managing your acute and chronic conditions during your regular appointments.  Please note that Annual Wellness Visits do not include a physical exam. Some assessments may be limited, especially if the visit was conducted virtually. If needed, we may recommend a separate in-person follow-up with your provider.  Ongoing Care Seeing your primary care provider every 3 to 6 months helps us  monitor your health and provide consistent, personalized care.   Referrals If a referral was made during today's visit and you haven't received any updates within two weeks, please contact the referred provider directly to check on the status.  Recommended Screenings:  Health Maintenance  Topic Date Due   Zoster (Shingles) Vaccine (2 of 2) 10/03/2021   COVID-19 Vaccine (7 - 2025-26 season) 10/14/2024   Medicare Annual Wellness Visit  05/09/2025   DTaP/Tdap/Td vaccine (2 - Td or Tdap) 04/30/2030   Pneumococcal Vaccine for age over 31  Completed   Flu Shot  Completed   Hepatitis C Screening  Completed   Meningitis B Vaccine  Aged Out   Colon Cancer Screening  Discontinued       05/09/2024   10:17 AM  Advanced Directives  Does Patient Have a Medical Advance Directive? No  Type of Estate agent of [place];Living will  Does patient want to make changes to medical advance directive? No - Patient declined  Copy of Healthcare Power of Attorney in Chart? Yes - validated most recent copy scanned in chart (See row information)   Advance Care  Planning is important because it: Ensures you receive medical care that aligns with your values, goals, and preferences. Provides guidance to your family and loved ones, reducing the emotional burden of decision-making during critical moments.  Vision: Annual vision screenings are recommended for early detection of glaucoma, cataracts, and diabetic retinopathy. These exams can also reveal signs of chronic conditions such as diabetes and high blood pressure.  Dental: Annual dental screenings help detect early signs of oral cancer, gum disease, and other conditions linked to overall health, including heart disease and diabetes.  Please see the attached documents for additional preventive care recommendations.

## 2024-05-16 ENCOUNTER — Encounter: Admitting: Physical Medicine & Rehabilitation

## 2024-05-23 ENCOUNTER — Encounter: Admitting: Physical Medicine & Rehabilitation

## 2024-05-24 ENCOUNTER — Ambulatory Visit: Admitting: Family Medicine

## 2024-05-27 ENCOUNTER — Encounter: Payer: Self-pay | Admitting: Family Medicine

## 2024-06-04 ENCOUNTER — Ambulatory Visit: Admitting: Family Medicine

## 2024-06-05 ENCOUNTER — Ambulatory Visit: Admitting: Family Medicine

## 2024-06-05 ENCOUNTER — Encounter: Payer: Self-pay | Admitting: Family Medicine

## 2024-06-05 VITALS — BP 124/70 | HR 78 | Temp 98.3°F | Wt 165.8 lb

## 2024-06-05 DIAGNOSIS — G2581 Restless legs syndrome: Secondary | ICD-10-CM | POA: Insufficient documentation

## 2024-06-05 MED ORDER — ROPINIROLE HCL 0.5 MG PO TABS: 0.5000 mg | ORAL_TABLET | Freq: Every day | ORAL | 2 refills | Status: DC

## 2024-06-05 NOTE — Progress Notes (Signed)
   Subjective:    Patient ID: Darrell Baldwin, male    DOB: 1946/02/04, 78 y.o.   MRN: [id number]  HPI Here complaining of having trouble getting comfortable in bed at night. Hid wife also says his legs jump around at times through the night. This started about 5 years ago.     Review of Systems  Constitutional: Negative.   Respiratory: Negative.    Cardiovascular: Negative.   Neurological:  Negative for tremors, weakness and numbness.       Objective:   Physical Exam Constitutional:      Appearance: Normal appearance.  Cardiovascular:     Rate and Rhythm: Normal rate and regular rhythm.     Pulses: Normal pulses.     Heart sounds: Normal heart sounds.  Pulmonary:     Effort: Pulmonary effort is normal.     Breath sounds: Normal breath sounds.  Neurological:     Mental Status: He is alert and oriented to person, place, and time. Mental status is at baseline.           Assessment & Plan:  Restless legs. He will try Ropinirole 0.5 mg at bedtime. Report back in one week.  Garnette Olmsted, MD

## 2024-06-12 ENCOUNTER — Ambulatory Visit: Payer: Self-pay

## 2024-06-12 NOTE — Telephone Encounter (Signed)
 FYI Only or Action Required?: FYI only for provider: appointment scheduled on 11.20.25.  Patient was last seen in primary care on 06/05/2024 by Johnny Garnette LABOR, MD.  Called Nurse Triage reporting Dizziness.  Symptoms began several days ago.  Interventions attempted: Rest, hydration, or home remedies.  Symptoms are: unchanged.  Triage Disposition: See Physician Within 24 Hours  Patient/caregiver understands and will follow disposition?: Yes   Copied from CRM [id number]. Topic: Clinical - Red Word Triage >> Jun 12, 2024  2:24 PM Tinnie BROCKS wrote: Red Word that prompted transfer to Nurse Triage: Wife Apolinar (on dpr) calling. Patient has been throwing up often for 2 days and is now very dizzy. She is driving so did not want to hold. Requesting call back at [phone]. She says he would only be willing to see Dr. Johnny Reason for Disposition  [1] MODERATE dizziness (e.g., interferes with normal activities) AND [2] has NOT been evaluated by doctor (or NP/PA) for this  (Exception: Dizziness caused by heat exposure, sudden standing, or poor fluid intake.)  Answer Assessment - Initial Assessment Questions Pt's wife states that he has been having dizziness and vomiting for the past 2 days. She thought initially it was the food they ate because her stomach also didn't feel well and they ate the same thing. She states that he did take his medications this morning (amlodipine ) and that after that he stated that moving his head made him more dizzy. She states last night he threw up on and off for about 40 minutes. She did give him something like pedialite and then gave him beets with vinegar  and then threw that up and all the pedialite. She states he has been drinking but he could probably drink more. He was with her and stated that he was able to have a normal bowel movement this am, denied any black or tarry stools and he urinated about an hour ago and was normal. Denies vomit looking like coffee grounds states  it's just water or the food he ate. Not currently dizzy. Pt states I feel fine. She also mentioned he took a laxative last night and belly stuck out. She states its not that way today.    1. DESCRIPTION: Describe your dizziness.     Feels like he might pass out when it happens 2. LIGHTHEADED: Do you feel lightheaded? (e.g., somewhat faint, woozy, weak upon standing)     Yes- not currently, on and off the last 2 days.  3. VERTIGO: Do you feel like either you or the room is spinning or tilting? (i.e., vertigo)     no 4. SEVERITY: How bad is it?  Do you feel like you are going to faint? Can you stand and walk?     Pt can walk  5. ONSET:  When did the dizziness begin?     2 days ago 6. AGGRAVATING FACTORS: Does anything make it worse? (e.g., standing, change in head position)     Pt states moving head makes it worse 7. HEART RATE: Can you tell me your heart rate? How many beats in 15 seconds?  (Note: Not all patients can do this.)       Unable to check 8. CAUSE: What do you think is causing the dizziness? (e.g., decreased fluids or food, diarrhea, emotional distress, heat exposure, new medicine, sudden standing, vomiting; unknown)     Unsure but pt has also had vomiting the last 2 days.   10. OTHER SYMPTOMS: Do you have any  other symptoms? (e.g., fever, chest pain, vomiting, diarrhea, bleeding)       Denies any higher acuity symptoms  Protocols used: Dizziness - Lightheadedness-A-AH

## 2024-06-12 NOTE — Telephone Encounter (Signed)
 Appt scheduled

## 2024-06-13 ENCOUNTER — Ambulatory Visit: Admitting: Family Medicine

## 2024-06-14 ENCOUNTER — Ambulatory Visit (INDEPENDENT_AMBULATORY_CARE_PROVIDER_SITE_OTHER): Admitting: Family Medicine

## 2024-06-14 ENCOUNTER — Encounter: Payer: Self-pay | Admitting: Family Medicine

## 2024-06-14 VITALS — BP 130/82 | HR 75 | Temp 98.3°F | Wt 167.0 lb

## 2024-06-14 DIAGNOSIS — R252 Cramp and spasm: Secondary | ICD-10-CM

## 2024-06-14 DIAGNOSIS — T887XXA Unspecified adverse effect of drug or medicament, initial encounter: Secondary | ICD-10-CM

## 2024-06-14 NOTE — Progress Notes (Signed)
   Subjective:    Patient ID: Darrell Baldwin, male    DOB: [birth year], 78 y.o.   MRN: [id number]  HPI Here for a week of dizziness and nausea and vomiting. No fever. No abdominal pain. He saw us  on 06-05-24, and he complained of restless legs that keep him awake at night. We started him on Ropinirole , and he thinks it helped his legs. However after taking this for 2 days he developed dizziness with nausea and occasional vomiting. He dealt with his for about a week until he decided that these were side effects of the medication. He stopped taking it 2 days ago, and now he feels much better. The dizziness is gone, and the nausea has almost resolved.    Review of Systems  Constitutional: Negative.   Respiratory: Negative.    Cardiovascular: Negative.   Gastrointestinal:  Positive for abdominal distention, nausea and vomiting. Negative for abdominal pain, blood in stool, constipation and diarrhea.  Neurological:  Positive for dizziness. Negative for headaches.       Objective:   Physical Exam Constitutional:      Appearance: Normal appearance.  Cardiovascular:     Rate and Rhythm: Normal rate and regular rhythm.     Pulses: Normal pulses.     Heart sounds: Normal heart sounds.  Pulmonary:     Effort: Pulmonary effort is normal.     Breath sounds: Normal breath sounds.  Abdominal:     General: Abdomen is flat. Bowel sounds are normal. There is no distension.     Palpations: Abdomen is soft. There is no mass.     Tenderness: There is no abdominal tenderness. There is no guarding or rebound.     Hernia: No hernia is present.  Neurological:     Mental Status: He is alert.           Assessment & Plan:  He was having nausea and vomiting as side effects from Ropinirole , so of course he will stay off it. Instead he will try taking 400 -800 mg of magnesium at bedtime and drinking lots of water.  Garnette Olmsted, MD

## 2024-06-17 NOTE — Progress Notes (Signed)
 [name] [address] [place] [address] Phone: [phone] Subjective:   [name], am serving as a scribe for Dr. Arthea Claudene.  I'm seeing this patient by the request  of:  Johnny Garnette LABOR, MD  CC: Left gluteal pain  YEP:[name]  04/29/2024 No retraction noted, given injection, discussed the possibility of PRP, discussed icing regimen and home exercises, discussed which activities to do and which ones to avoid.  Increase activity slowly.  I do believe that some of the weakness that contributed to this could be from his back.  Does have underlying arthritic changes noted as well I will need to continue to monitor.  Follow-up again in 6 to 8 weeks otherwise.     Updated 06/24/2024 BRANDIN STETZER is a 78 y.o. male coming in with complaint of L gluteal pain, at last exam in October given an injection in the left gluteal tendon sheath.  Given exercises for strengthening.  Patient states that his pain has improved. Painful to perform lumbar flexion.       Past Medical History:  Diagnosis Date   Allergic rhinitis    Hypertension    Past Surgical History:  Procedure Laterality Date   COLONOSCOPY  04/11/2014   per Dr. Obie, diverticulosis and internal hemorrhoids, no polyps, repeat in 10 yrs    Social History   Socioeconomic History   Marital status: Married    Spouse name: Not on file   Number of children: Not on file   Years of education: Not on file   Highest education level: Not on file  Occupational History   Not on file  Tobacco Use   Smoking status: Never   Smokeless tobacco: Never  Vaping Use   Vaping status: Never Used  Substance and Sexual Activity   Alcohol use: No    Alcohol/week: 0.0 standard drinks of alcohol   Drug use: No   Sexual activity: Not on file  Other Topics Concern   Not on file  Social History Narrative   Not on file   Social Drivers of Health   Financial Resource Strain: Low Risk   (05/09/2024)   Overall Financial Resource Strain (CARDIA)    Difficulty of Paying Living Expenses: Not hard at all  Food Insecurity: No Food Insecurity (08/15/2022)   Hunger Vital Sign    Worried About Running Out of Food in the Last Year: Never true    Ran Out of Food in the Last Year: Never true  Transportation Needs: No Transportation Needs (05/09/2024)   PRAPARE - Administrator, Civil Service (Medical): No    Lack of Transportation (Non-Medical): No  Physical Activity: Sufficiently Active (05/09/2024)   Exercise Vital Sign    Days of Exercise per Week: 7 days    Minutes of Exercise per Session: 30 min  Stress: No Stress Concern Present (05/09/2024)   [employer] of Occupational Health - Occupational Stress Questionnaire    Feeling of Stress: Not at all  Social Connections: Socially Integrated (05/09/2024)   Social Connection and Isolation Panel    Frequency of Communication with Friends and Family: More than three times a week    Frequency of Social Gatherings with Friends and Family: More than three times a week    Attends Religious Services: More than 4 times per year    Active Member of [employer] or Organizations: Yes    Attends Banker Meetings: 1 to 4 times per year  Marital Status: Married   Allergies  Allergen Reactions   Ropinirole  Hcl Nausea And Vomiting   Statins     Muscle cramps   Family History  Problem Relation Age of Onset   Breast cancer Sister    Diabetes Sister    Lung cancer Brother    Liver cancer Sister    Diabetes Sister    Colon cancer Neg Hx      Current Outpatient Medications (Cardiovascular):    amLODipine  (NORVASC ) 10 MG tablet, Take 1 tablet (10 mg total) by mouth daily.   EPINEPHrine 0.3 mg/0.3 mL IJ SOAJ injection, as directed Injection prn for 30 days   ezetimibe  (ZETIA ) 10 MG tablet, Take 1 tablet (10 mg total) by mouth daily.   sildenafil  (VIAGRA ) 100 MG tablet, Take 1 tablet (100 mg total) by mouth as  needed for erectile dysfunction.  Current Outpatient Medications (Respiratory):    albuterol  (VENTOLIN  HFA) 108 (90 Base) MCG/ACT inhaler, Inhale 2 puffs into the lungs every 4 (four) hours as needed for wheezing or shortness of breath.   chlorpheniramine-HYDROcodone  (TUSSIONEX PENNKINETIC ER) 10-8 MG/5ML, Take 5 mLs by mouth every 12 (twelve) hours as needed for cough.   dextromethorphan (DELSYM) 30 MG/5ML liquid, Take by mouth as needed for cough.   fluticasone  (FLONASE ) 50 MCG/ACT nasal spray, Place 2 sprays into both nostrils daily.   levocetirizine (XYZAL ) 5 MG tablet, Take 1 tablet (5 mg total) by mouth every evening.   montelukast  (SINGULAIR ) 10 MG tablet, Take 1 tablet (10 mg total) by mouth at bedtime.  Current Outpatient Medications (Analgesics):    allopurinol  (ZYLOPRIM ) 300 MG tablet, Take 1 tablet (300 mg total) by mouth daily.   ibuprofen  (ADVIL ) 800 MG tablet, Take 1 tablet (800 mg total) by mouth every 6 (six) hours as needed for mild pain (pain score 1-3).   Current Outpatient Medications (Other):    COMIRNATY syringe,    cyclobenzaprine  (FLEXERIL ) 10 MG tablet, TAKE 1 TABLET BY MOUTH EVERYDAY AT BEDTIME   terbinafine  (LAMISIL ) 250 MG tablet, Take 1 tablet (250 mg total) by mouth daily.   Reviewed prior external information including notes and imaging from  primary care provider As well as notes that were available from care everywhere and other healthcare systems.  Past medical history, social, surgical and family history all reviewed in electronic medical record.  No pertanent information unless stated regarding to the chief complaint.   Review of Systems:  No headache, visual changes, nausea, vomiting, diarrhea, constipation, dizziness, abdominal pain, skin rash, fevers, chills, night sweats, weight loss, swollen lymph nodes, body aches, joint swelling, chest pain, shortness of breath, mood changes. POSITIVE muscle aches  Objective  Blood pressure 132/82, pulse 71,  height 5' 7 (1.702 m), weight 167 lb (75.8 kg), SpO2 98%.   General: No apparent distress alert and oriented x3 mood and affect normal, dressed appropriately.  HEENT: Pupils equal, extraocular movements intact  Respiratory: Patient's speak in full sentences and does not appear short of breath  Cardiovascular: No lower extremity edema, non tender, no erythema  Left hip exam shows significant improvement in range of motion.  Negative straight leg test noted at this time.  5 out of 5 strength and symmetric to the contralateral side.    Impression and Recommendations:    The above documentation has been reviewed and is accurate and complete Glynis Hunsucker M Jocee Kissick, DO

## 2024-06-24 ENCOUNTER — Other Ambulatory Visit: Payer: Self-pay

## 2024-06-24 ENCOUNTER — Ambulatory Visit: Admitting: Family Medicine

## 2024-06-24 VITALS — BP 132/82 | HR 71 | Ht 67.0 in | Wt 167.0 lb

## 2024-06-24 DIAGNOSIS — S76012D Strain of muscle, fascia and tendon of left hip, subsequent encounter: Secondary | ICD-10-CM | POA: Diagnosis not present

## 2024-06-24 DIAGNOSIS — M25552 Pain in left hip: Secondary | ICD-10-CM

## 2024-06-24 NOTE — Patient Instructions (Signed)
 Great to see you  Keep working hard  Red or green bands now See me again in 3 months Happy holidays!

## 2024-06-24 NOTE — Assessment & Plan Note (Signed)
 Significant improvement at this time.  Discussed the possibility of PRP if needed.  Patient otherwise feeling 85% better at the moment.  Increase activity slowly.  Discussed icing regimen.  Follow-up again in 3 months

## 2024-06-25 ENCOUNTER — Telehealth: Payer: Self-pay | Admitting: Family Medicine

## 2024-06-25 MED ORDER — AMLODIPINE BESYLATE 10 MG PO TABS: 10.0000 mg | ORAL_TABLET | Freq: Every day | ORAL | 3 refills | Status: AC

## 2024-06-25 NOTE — Telephone Encounter (Signed)
 Done

## 2024-07-19 ENCOUNTER — Ambulatory Visit: Payer: Self-pay

## 2024-07-19 ENCOUNTER — Other Ambulatory Visit: Payer: Self-pay | Admitting: Family Medicine

## 2024-07-19 MED ORDER — ALLOPURINOL 300 MG PO TABS: 300.0000 mg | ORAL_TABLET | Freq: Every day | ORAL | 3 refills | Status: AC

## 2024-07-19 NOTE — Telephone Encounter (Signed)
 Copied from CRM #[id number]. Topic: General - Other >> Jul 19, 2024 10:32 AM Darrell Baldwin HERO wrote: Reason for CRM: patients gout has flared and doesn't know which medication was given for it but needs a refill. Please call his wife when you get a chance.

## 2024-07-19 NOTE — Telephone Encounter (Signed)
" ° °  FYI Only or Action Required?: FYI only for provider: f/u on refill request.  Patient was last seen in primary care on 06/14/2024 by Johnny Garnette LABOR, MD.  Called Nurse Triage reporting Medication Refill.  Symptoms began today.  Interventions attempted: Nothing.  Symptoms are: stable.  Triage Disposition: Home Care  Patient/caregiver understands and will follow disposition?:  Advised rx was sent to [facility] Copied from CRM #[id number]. Topic: Clinical - Red Word Triage >> Jul 19, 2024  1:54 PM Alexandria E wrote: [facility] that prompted transfer to Nurse Triage: Gout flare up, patient rated pain a 10 out of 10. Needs medicine called in, unaware of medication name. Wife on the line. Reason for Disposition  [1] Prescription prescribed recently is not at pharmacy AND [2] triager has access to patient's EMR AND [3] prescription is recorded in the EMR  Answer Assessment - Initial Assessment Questions 1. NAME of MEDICINE: What medicine(s) are you calling about?  Allopurinol , needs refill  Protocols used: Medication Question Call-A-AH  "

## 2024-07-23 NOTE — Telephone Encounter (Signed)
 Pt notified to pick up Rx from his pharmacy, voiced understanding

## 2024-08-01 ENCOUNTER — Encounter: Payer: Self-pay | Admitting: Family Medicine

## 2024-08-01 ENCOUNTER — Ambulatory Visit (INDEPENDENT_AMBULATORY_CARE_PROVIDER_SITE_OTHER): Admitting: Family Medicine

## 2024-08-01 VITALS — BP 136/86 | HR 41 | Temp 98.3°F | Wt 167.6 lb

## 2024-08-01 DIAGNOSIS — M25551 Pain in right hip: Secondary | ICD-10-CM

## 2024-08-01 DIAGNOSIS — M25552 Pain in left hip: Secondary | ICD-10-CM | POA: Diagnosis not present

## 2024-08-01 MED ORDER — CELECOXIB 200 MG PO CAPS: 200.0000 mg | ORAL_CAPSULE | Freq: Two times a day (BID) | ORAL | 2 refills | Status: AC

## 2024-08-01 NOTE — Progress Notes (Signed)
" ° °  Subjective:    Patient ID: Darrell Baldwin, male    DOB: 05-02-1946, 79 y.o.   MRN: [id number]  HPI Here to follow up on left hip pain. He has been dealing with this for the past year, and over the past month he has also developed pain in the right hip. 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 He has been seeing Dr. Zach Smith for the hip pain, and he was given a steroid injection in the hip on 06-24-24. This has helped the sharp aspect of pain in the left hip, but the aching pains in both hips persists.    Review of Systems  Constitutional: Negative.   Respiratory: Negative.    Cardiovascular: Negative.   Musculoskeletal:  Positive for arthralgias.       Objective:   Physical Exam Constitutional:      Appearance: Normal appearance.  Cardiovascular:     Rate and Rhythm: Normal rate and regular rhythm.     Pulses: Normal pulses.     Heart sounds: Normal heart sounds.  Pulmonary:     Effort: Pulmonary effort is normal.     Breath sounds: Normal breath sounds.  Neurological:     Mental Status: He is alert.           Assessment & Plan:  OA of both hips. He will try Celebrex  200 mg BID and report back in a few weeks.  Garnette Olmsted, MD   "

## 2024-08-05 ENCOUNTER — Telehealth: Payer: Self-pay | Admitting: Family Medicine

## 2024-08-05 NOTE — Telephone Encounter (Signed)
 Patient wife Harriet) called and says husband is in a lot of pain and ibuprofen  800 is not helping. She was not able to say where exactly was hurting but just that he is not resting at all. Patient is schedule for the 27th of January per schedule and no availability sooner and wife was not interested in scheduling him with another doctor. She would like to know if there is anything else they can do, and that she may have to take him to the emergency room but she knew they could not do the injection we can do here, there.  Patient is on cancellation list.  Please advise.

## 2024-08-06 ENCOUNTER — Encounter: Payer: Self-pay | Admitting: Family Medicine

## 2024-08-06 ENCOUNTER — Ambulatory Visit: Admitting: Family Medicine

## 2024-08-06 ENCOUNTER — Other Ambulatory Visit: Payer: Self-pay

## 2024-08-06 VITALS — BP 124/84 | HR 88 | Ht 67.0 in | Wt 167.0 lb

## 2024-08-06 DIAGNOSIS — S76012D Strain of muscle, fascia and tendon of left hip, subsequent encounter: Secondary | ICD-10-CM | POA: Diagnosis not present

## 2024-08-06 DIAGNOSIS — M25552 Pain in left hip: Secondary | ICD-10-CM

## 2024-08-06 NOTE — Assessment & Plan Note (Signed)
 Patient given repeat injection.  Was able to ambulate better.  Discussed with patient though that does have some underlying arthritic changes noted as well.  May need to consider the possibility of a joint injection if continuing to have pain.  Discussed icing regimen and home exercises, follow-up again in 6 to 12 weeks

## 2024-08-06 NOTE — Progress Notes (Signed)
 " [name] [address] [place] [address] Phone: [phone] Subjective:   I, Susannah Gully, am serving as a scribe for Dr. Arthea Claudene. s I'm seeing this patient by the request  of:  Johnny Garnette LABOR, MD  CC: Left hip pain  YEP:[name]  06/24/2024 Significant improvement at this time.  Discussed the possibility of PRP if needed.  Patient otherwise feeling 85% better at the moment.  Increase activity slowly.  Discussed icing regimen.  Follow-up again in 3 months     Update 08/06/2024 ABDULKADIR EMMANUEL is a 79 y.o. male coming in with complaint of L hip pain. Patient states injection in hip helped a lot. Just started feeling it last Friday.      Past Medical History:  Diagnosis Date   Allergic rhinitis    Hypertension    Past Surgical History:  Procedure Laterality Date   COLONOSCOPY  04/11/2014   per Dr. Obie, diverticulosis and internal hemorrhoids, no polyps, repeat in 10 yrs    Social History   Socioeconomic History   Marital status: Married    Spouse name: Not on file   Number of children: Not on file   Years of education: Not on file   Highest education level: Not on file  Occupational History   Not on file  Tobacco Use   Smoking status: Never   Smokeless tobacco: Never  Vaping Use   Vaping status: Never Used  Substance and Sexual Activity   Alcohol use: No    Alcohol/week: 0.0 standard drinks of alcohol   Drug use: No   Sexual activity: Not on file  Other Topics Concern   Not on file  Social History Narrative   Not on file   Social Drivers of Health   Tobacco Use: Low Risk (08/01/2024)   Patient History    Smoking Tobacco Use: Never    Smokeless Tobacco Use: Never    Passive Exposure: Not on file  Financial Resource Strain: Low Risk (05/09/2024)   Overall Financial Resource Strain (CARDIA)    Difficulty of Paying Living Expenses: Not hard at all  Food Insecurity: No Food Insecurity (08/15/2022)   Hunger  Vital Sign    Worried About Running Out of Food in the Last Year: Never true    Ran Out of Food in the Last Year: Never true  Transportation Needs: No Transportation Needs (05/09/2024)   Epic    Lack of Transportation (Medical): No    Lack of Transportation (Non-Medical): No  Physical Activity: Sufficiently Active (05/09/2024)   Exercise Vital Sign    Days of Exercise per Week: 7 days    Minutes of Exercise per Session: 30 min  Stress: No Stress Concern Present (05/09/2024)   [employer] of Occupational Health - Occupational Stress Questionnaire    Feeling of Stress: Not at all  Social Connections: Socially Integrated (05/09/2024)   Social Connection and Isolation Panel    Frequency of Communication with Friends and Family: More than three times a week    Frequency of Social Gatherings with Friends and Family: More than three times a week    Attends Religious Services: More than 4 times per year    Active Member of Clubs or Organizations: Yes    Attends Banker Meetings: 1 to 4 times per year    Marital Status: Married  Depression (PHQ2-9): Low Risk (05/09/2024)   Depression (PHQ2-9)    PHQ-2 Score: 0  Alcohol Screen: Low Risk (  05/09/2024)   Alcohol Screen    Last Alcohol Screening Score (AUDIT): 0  Housing: Low Risk (05/09/2024)   Epic    Unable to Pay for Housing in the Last Year: No    Number of Times Moved in the Last Year: 0    Homeless in the Last Year: No  Utilities: Not At Risk (05/09/2024)   Epic    Threatened with loss of utilities: No  Health Literacy: Adequate Health Literacy (05/09/2024)   B1300 Health Literacy    Frequency of need for help with medical instructions: Never   Allergies[1] Family History  Problem Relation Age of Onset   Breast cancer Sister    Diabetes Sister    Lung cancer Brother    Liver cancer Sister    Diabetes Sister    Colon cancer Neg Hx     Current Outpatient Medications (Cardiovascular):    amLODipine   (NORVASC ) 10 MG tablet, Take 1 tablet (10 mg total) by mouth daily.   EPINEPHrine 0.3 mg/0.3 mL IJ SOAJ injection, as directed Injection prn for 30 days   ezetimibe  (ZETIA ) 10 MG tablet, Take 1 tablet (10 mg total) by mouth daily.   sildenafil  (VIAGRA ) 100 MG tablet, Take 1 tablet (100 mg total) by mouth as needed for erectile dysfunction.  Current Outpatient Medications (Respiratory):    albuterol  (VENTOLIN  HFA) 108 (90 Base) MCG/ACT inhaler, Inhale 2 puffs into the lungs every 4 (four) hours as needed for wheezing or shortness of breath.   chlorpheniramine-HYDROcodone  (TUSSIONEX PENNKINETIC ER) 10-8 MG/5ML, Take 5 mLs by mouth every 12 (twelve) hours as needed for cough.   dextromethorphan (DELSYM) 30 MG/5ML liquid, Take by mouth as needed for cough.   fluticasone  (FLONASE ) 50 MCG/ACT nasal spray, Place 2 sprays into both nostrils daily.   levocetirizine (XYZAL ) 5 MG tablet, Take 1 tablet (5 mg total) by mouth every evening.   montelukast  (SINGULAIR ) 10 MG tablet, Take 1 tablet (10 mg total) by mouth at bedtime.  Current Outpatient Medications (Analgesics):    allopurinol  (ZYLOPRIM ) 300 MG tablet, Take 1 tablet (300 mg total) by mouth daily.   celecoxib  (CELEBREX ) 200 MG capsule, Take 1 capsule (200 mg total) by mouth 2 (two) times daily.   ibuprofen  (ADVIL ) 800 MG tablet, Take 1 tablet (800 mg total) by mouth every 6 (six) hours as needed for mild pain (pain score 1-3).  Current Outpatient Medications (Other):    COMIRNATY syringe,    cyclobenzaprine  (FLEXERIL ) 10 MG tablet, TAKE 1 TABLET BY MOUTH EVERYDAY AT BEDTIME   terbinafine  (LAMISIL ) 250 MG tablet, Take 1 tablet (250 mg total) by mouth daily.   Reviewed prior external information including notes and imaging from  primary care provider As well as notes that were available from care everywhere and other healthcare systems.  Past medical history, social, surgical and family history all reviewed in electronic medical record.  No  pertanent information unless stated regarding to the chief complaint.   Review of Systems:  No headache, visual changes, nausea, vomiting, diarrhea, constipation, dizziness, abdominal pain, skin rash, fevers, chills, night sweats, weight loss, swollen lymph nodes, body aches, joint swelling, chest pain, shortness of breath, mood changes. POSITIVE muscle aches  Objective  Blood pressure 124/84, pulse 88, height 5' 7 (1.702 m), weight 167 lb (75.8 kg), SpO2 97%.   General: No apparent distress alert and oriented x3 mood and affect normal, dressed appropriately.  HEENT: Pupils equal, extraocular movements intact  Respiratory: Patient's speak in full sentences and does not  appear short of breath  Cardiovascular: No lower extremity edema, non tender, no erythema  Left hip does have tenderness to palpation noted.  Seems to be over the gluteal tendon again.  Does have some loss of lordosis of the lumbar spine.   Procedure: Real-time Ultrasound Guided Injection of left gluteal tendon sheath Device: GE Logiq Q7 Ultrasound guided injection is preferred based studies that show increased duration, increased effect, greater accuracy, decreased procedural pain, increased response rate, and decreased cost with ultrasound guided versus blind injection.  Verbal informed consent obtained.  Time-out conducted.  Noted no overlying erythema, induration, or other signs of local infection.  Skin prepped in a sterile fashion.  Local anesthesia: Topical Ethyl chloride.  With sterile technique and under real time ultrasound guidance: With a 21-gauge 2 inch needle injected into the gluteal sheath with 1 cc 0.5% Marcaine  and 1 cc of Kenalog  40 mg/mL Completed without difficulty  Pain immediately resolved suggesting accurate placement of the medication.  Advised to call if fevers/chills, erythema, induration, drainage, or persistent bleeding.  Impression: Technically successful ultrasound guided injection. Impression  and Recommendations:     The above documentation has been reviewed and is accurate and complete Arthea CHRISTELLA Sharps, DO       [1]  Allergies Allergen Reactions   Ropinirole  Hcl Nausea And Vomiting   Statins     Muscle cramps   "

## 2024-08-06 NOTE — Patient Instructions (Addendum)
 Injection today Good to see you! See you again in 10 weeks

## 2024-08-13 ENCOUNTER — Encounter: Admitting: Family Medicine

## 2024-08-20 ENCOUNTER — Ambulatory Visit: Admitting: Family Medicine

## 2024-10-15 ENCOUNTER — Ambulatory Visit: Admitting: Family Medicine
